# Patient Record
Sex: Female | Born: 1950
Health system: Southern US, Community
[De-identification: ages and names within clinical notes are randomized; demographics above are authoritative.]

## PROBLEM LIST (undated history)

## (undated) DIAGNOSIS — K219 Gastro-esophageal reflux disease without esophagitis: Secondary | ICD-10-CM

## (undated) DIAGNOSIS — R51 Headache: Secondary | ICD-10-CM

## (undated) DIAGNOSIS — D509 Iron deficiency anemia, unspecified: Secondary | ICD-10-CM

## (undated) DIAGNOSIS — J449 Chronic obstructive pulmonary disease, unspecified: Secondary | ICD-10-CM

## (undated) DIAGNOSIS — E785 Hyperlipidemia, unspecified: Secondary | ICD-10-CM

## (undated) DIAGNOSIS — M1990 Unspecified osteoarthritis, unspecified site: Secondary | ICD-10-CM

## (undated) DIAGNOSIS — I82409 Acute embolism and thrombosis of unspecified deep veins of unspecified lower extremity: Secondary | ICD-10-CM

## (undated) DIAGNOSIS — D649 Anemia, unspecified: Secondary | ICD-10-CM

## (undated) DIAGNOSIS — K259 Gastric ulcer, unspecified as acute or chronic, without hemorrhage or perforation: Secondary | ICD-10-CM

## (undated) DIAGNOSIS — I10 Essential (primary) hypertension: Secondary | ICD-10-CM

## (undated) DIAGNOSIS — G8929 Other chronic pain: Secondary | ICD-10-CM

## (undated) DIAGNOSIS — G4733 Obstructive sleep apnea (adult) (pediatric): Secondary | ICD-10-CM

## (undated) DIAGNOSIS — N840 Polyp of corpus uteri: Secondary | ICD-10-CM

## (undated) DIAGNOSIS — R519 Headache, unspecified: Secondary | ICD-10-CM

## (undated) DIAGNOSIS — M1710 Unilateral primary osteoarthritis, unspecified knee: Secondary | ICD-10-CM

## (undated) DIAGNOSIS — Z9289 Personal history of other medical treatment: Secondary | ICD-10-CM

## (undated) DIAGNOSIS — M545 Low back pain, unspecified: Secondary | ICD-10-CM

## (undated) DIAGNOSIS — E119 Type 2 diabetes mellitus without complications: Secondary | ICD-10-CM

## (undated) DIAGNOSIS — I509 Heart failure, unspecified: Secondary | ICD-10-CM

## (undated) DIAGNOSIS — IMO0002 Reserved for concepts with insufficient information to code with codable children: Secondary | ICD-10-CM

## (undated) DIAGNOSIS — I2699 Other pulmonary embolism without acute cor pulmonale: Secondary | ICD-10-CM

## (undated) DIAGNOSIS — K449 Diaphragmatic hernia without obstruction or gangrene: Secondary | ICD-10-CM

## (undated) HISTORY — DX: Gastro-esophageal reflux disease without esophagitis: K21.9

## (undated) HISTORY — DX: Unspecified osteoarthritis, unspecified site: M19.90

## (undated) HISTORY — DX: Other pulmonary embolism without acute cor pulmonale: I26.99

## (undated) HISTORY — DX: Obstructive sleep apnea (adult) (pediatric): G47.33

## (undated) HISTORY — PX: RETINAL DETACHMENT SURGERY: SHX105

## (undated) HISTORY — DX: Hyperlipidemia, unspecified: E78.5

## (undated) HISTORY — PX: EYE SURGERY: SHX253

## (undated) HISTORY — DX: Unilateral primary osteoarthritis, unspecified knee: M17.10

## (undated) HISTORY — DX: Essential (primary) hypertension: I10

## (undated) HISTORY — DX: Morbid (severe) obesity due to excess calories: E66.01

## (undated) HISTORY — DX: Iron deficiency anemia, unspecified: D50.9

## (undated) HISTORY — PX: CATARACT EXTRACTION W/ INTRAOCULAR LENS  IMPLANT, BILATERAL: SHX1307

## (undated) HISTORY — PX: TUBAL LIGATION: SHX77

## (undated) HISTORY — DX: Reserved for concepts with insufficient information to code with codable children: IMO0002

---

## 2000-09-25 ENCOUNTER — Ambulatory Visit (HOSPITAL_COMMUNITY): Admission: RE | Admit: 2000-09-25 | Discharge: 2000-09-25 | Payer: Self-pay | Admitting: Cardiovascular Disease

## 2001-10-17 ENCOUNTER — Ambulatory Visit (HOSPITAL_COMMUNITY): Admission: RE | Admit: 2001-10-17 | Discharge: 2001-10-17 | Payer: Self-pay | Admitting: Family Medicine

## 2001-10-17 ENCOUNTER — Encounter: Payer: Self-pay | Admitting: Family Medicine

## 2003-07-20 ENCOUNTER — Encounter: Payer: Self-pay | Admitting: Occupational Therapy

## 2003-07-20 ENCOUNTER — Ambulatory Visit (HOSPITAL_COMMUNITY): Admission: RE | Admit: 2003-07-20 | Discharge: 2003-07-20 | Payer: Self-pay | Admitting: Family Medicine

## 2004-09-21 ENCOUNTER — Ambulatory Visit: Payer: Self-pay | Admitting: Family Medicine

## 2005-05-03 ENCOUNTER — Ambulatory Visit: Payer: Self-pay | Admitting: Family Medicine

## 2005-05-04 ENCOUNTER — Ambulatory Visit: Payer: Self-pay | Admitting: *Deleted

## 2005-05-10 ENCOUNTER — Ambulatory Visit (HOSPITAL_COMMUNITY): Admission: RE | Admit: 2005-05-10 | Discharge: 2005-05-10 | Payer: Self-pay | Admitting: Family Medicine

## 2005-05-18 ENCOUNTER — Ambulatory Visit: Payer: Self-pay | Admitting: Family Medicine

## 2005-06-25 ENCOUNTER — Ambulatory Visit (HOSPITAL_COMMUNITY): Admission: RE | Admit: 2005-06-25 | Discharge: 2005-06-25 | Payer: Self-pay | Admitting: Family Medicine

## 2005-09-12 ENCOUNTER — Ambulatory Visit: Payer: Self-pay | Admitting: Family Medicine

## 2005-10-08 ENCOUNTER — Ambulatory Visit: Payer: Self-pay | Admitting: Family Medicine

## 2006-02-07 ENCOUNTER — Ambulatory Visit: Payer: Self-pay | Admitting: Family Medicine

## 2006-04-16 ENCOUNTER — Ambulatory Visit: Payer: Self-pay | Admitting: Family Medicine

## 2006-05-08 ENCOUNTER — Ambulatory Visit: Payer: Self-pay | Admitting: Gastroenterology

## 2006-05-16 ENCOUNTER — Ambulatory Visit (HOSPITAL_COMMUNITY): Admission: RE | Admit: 2006-05-16 | Discharge: 2006-05-16 | Payer: Self-pay | Admitting: Gastroenterology

## 2006-05-21 ENCOUNTER — Ambulatory Visit: Payer: Self-pay | Admitting: Gastroenterology

## 2006-06-24 ENCOUNTER — Ambulatory Visit: Payer: Self-pay | Admitting: Family Medicine

## 2006-09-16 ENCOUNTER — Ambulatory Visit: Payer: Self-pay | Admitting: Family Medicine

## 2006-09-19 ENCOUNTER — Ambulatory Visit (HOSPITAL_COMMUNITY): Admission: RE | Admit: 2006-09-19 | Discharge: 2006-09-19 | Payer: Self-pay | Admitting: Family Medicine

## 2006-10-07 ENCOUNTER — Ambulatory Visit: Payer: Self-pay | Admitting: Family Medicine

## 2006-10-10 DIAGNOSIS — G43909 Migraine, unspecified, not intractable, without status migrainosus: Secondary | ICD-10-CM

## 2006-10-15 ENCOUNTER — Encounter: Admission: RE | Admit: 2006-10-15 | Discharge: 2006-10-15 | Payer: Self-pay | Admitting: Neurology

## 2006-11-11 ENCOUNTER — Ambulatory Visit: Payer: Self-pay | Admitting: Family Medicine

## 2006-11-14 ENCOUNTER — Ambulatory Visit (HOSPITAL_BASED_OUTPATIENT_CLINIC_OR_DEPARTMENT_OTHER): Admission: RE | Admit: 2006-11-14 | Discharge: 2006-11-14 | Payer: Self-pay | Admitting: Family Medicine

## 2006-11-17 ENCOUNTER — Ambulatory Visit: Payer: Self-pay | Admitting: Internal Medicine

## 2007-01-08 ENCOUNTER — Ambulatory Visit (HOSPITAL_BASED_OUTPATIENT_CLINIC_OR_DEPARTMENT_OTHER): Admission: RE | Admit: 2007-01-08 | Discharge: 2007-01-08 | Payer: Self-pay | Admitting: Family Medicine

## 2007-01-12 ENCOUNTER — Ambulatory Visit: Payer: Self-pay | Admitting: Internal Medicine

## 2007-04-21 ENCOUNTER — Emergency Department (HOSPITAL_COMMUNITY): Admission: EM | Admit: 2007-04-21 | Discharge: 2007-04-21 | Payer: Self-pay | Admitting: *Deleted

## 2007-06-10 ENCOUNTER — Ambulatory Visit: Payer: Self-pay | Admitting: Family Medicine

## 2007-06-25 ENCOUNTER — Ambulatory Visit: Payer: Self-pay | Admitting: Family Medicine

## 2007-07-07 ENCOUNTER — Encounter: Admission: RE | Admit: 2007-07-07 | Discharge: 2007-07-07 | Payer: Self-pay | Admitting: Sports Medicine

## 2007-07-16 ENCOUNTER — Encounter: Admission: RE | Admit: 2007-07-16 | Discharge: 2007-07-16 | Payer: Self-pay | Admitting: Orthopedic Surgery

## 2007-08-04 DIAGNOSIS — J029 Acute pharyngitis, unspecified: Secondary | ICD-10-CM

## 2007-08-04 DIAGNOSIS — I10 Essential (primary) hypertension: Secondary | ICD-10-CM | POA: Insufficient documentation

## 2007-08-04 DIAGNOSIS — R51 Headache: Secondary | ICD-10-CM

## 2007-08-04 DIAGNOSIS — K219 Gastro-esophageal reflux disease without esophagitis: Secondary | ICD-10-CM | POA: Insufficient documentation

## 2007-08-04 DIAGNOSIS — E119 Type 2 diabetes mellitus without complications: Secondary | ICD-10-CM

## 2007-08-04 DIAGNOSIS — K5730 Diverticulosis of large intestine without perforation or abscess without bleeding: Secondary | ICD-10-CM | POA: Insufficient documentation

## 2007-08-04 DIAGNOSIS — M1990 Unspecified osteoarthritis, unspecified site: Secondary | ICD-10-CM

## 2007-08-04 DIAGNOSIS — G4730 Sleep apnea, unspecified: Secondary | ICD-10-CM | POA: Insufficient documentation

## 2007-08-04 DIAGNOSIS — R519 Headache, unspecified: Secondary | ICD-10-CM | POA: Insufficient documentation

## 2008-01-09 ENCOUNTER — Encounter (INDEPENDENT_AMBULATORY_CARE_PROVIDER_SITE_OTHER): Payer: Self-pay | Admitting: Family Medicine

## 2008-01-21 ENCOUNTER — Ambulatory Visit: Payer: Self-pay | Admitting: Nurse Practitioner

## 2008-01-21 DIAGNOSIS — G4700 Insomnia, unspecified: Secondary | ICD-10-CM

## 2008-01-29 ENCOUNTER — Encounter (INDEPENDENT_AMBULATORY_CARE_PROVIDER_SITE_OTHER): Payer: Self-pay | Admitting: Family Medicine

## 2008-01-29 ENCOUNTER — Ambulatory Visit: Payer: Self-pay | Admitting: Nurse Practitioner

## 2008-01-29 LAB — CONVERTED CEMR LAB
Albumin: 3.7 g/dL (ref 3.5–5.2)
BUN: 10 mg/dL (ref 6–23)
CO2: 25 meq/L (ref 19–32)
Calcium: 9.3 mg/dL (ref 8.4–10.5)
Chloride: 106 meq/L (ref 96–112)
Glucose, Bld: 107 mg/dL — ABNORMAL HIGH (ref 70–99)
HDL: 49 mg/dL (ref >39)
Lymphocytes Relative: 24 % (ref 12–46)
Lymphs Abs: 1.5 10*3/uL (ref 0.7–4.0)
MCV: 90.3 fL (ref 78.0–100.0)
Monocytes Relative: 5 % (ref 3–12)
Neutro Abs: 4.1 10*3/uL (ref 1.7–7.7)
Neutrophils Relative %: 69 % (ref 43–77)
Potassium: 4.5 meq/L (ref 3.5–5.3)
RBC: 4.65 M/uL (ref 3.87–5.11)
Triglycerides: 111 mg/dL (ref <150)
WBC: 6 10*3/uL (ref 4.0–10.5)

## 2008-02-05 ENCOUNTER — Telehealth (INDEPENDENT_AMBULATORY_CARE_PROVIDER_SITE_OTHER): Payer: Self-pay | Admitting: Family Medicine

## 2008-02-12 ENCOUNTER — Encounter (INDEPENDENT_AMBULATORY_CARE_PROVIDER_SITE_OTHER): Payer: Self-pay | Admitting: Family Medicine

## 2008-03-11 ENCOUNTER — Telehealth (INDEPENDENT_AMBULATORY_CARE_PROVIDER_SITE_OTHER): Payer: Self-pay | Admitting: Family Medicine

## 2008-03-12 ENCOUNTER — Encounter (INDEPENDENT_AMBULATORY_CARE_PROVIDER_SITE_OTHER): Payer: Self-pay | Admitting: Family Medicine

## 2008-05-05 ENCOUNTER — Ambulatory Visit: Payer: Self-pay | Admitting: Family Medicine

## 2008-05-05 DIAGNOSIS — R5383 Other fatigue: Secondary | ICD-10-CM

## 2008-05-05 DIAGNOSIS — R5381 Other malaise: Secondary | ICD-10-CM

## 2008-05-05 LAB — CONVERTED CEMR LAB
Blood Glucose, Fingerstick: 96
HCT: 44 %

## 2008-05-09 ENCOUNTER — Telehealth (INDEPENDENT_AMBULATORY_CARE_PROVIDER_SITE_OTHER): Payer: Self-pay | Admitting: Family Medicine

## 2008-05-12 ENCOUNTER — Telehealth (INDEPENDENT_AMBULATORY_CARE_PROVIDER_SITE_OTHER): Payer: Self-pay | Admitting: Family Medicine

## 2008-06-02 ENCOUNTER — Encounter (INDEPENDENT_AMBULATORY_CARE_PROVIDER_SITE_OTHER): Payer: Self-pay | Admitting: Family Medicine

## 2008-06-02 ENCOUNTER — Telehealth (INDEPENDENT_AMBULATORY_CARE_PROVIDER_SITE_OTHER): Payer: Self-pay | Admitting: Family Medicine

## 2008-06-30 ENCOUNTER — Encounter (INDEPENDENT_AMBULATORY_CARE_PROVIDER_SITE_OTHER): Payer: Self-pay | Admitting: Family Medicine

## 2008-07-05 ENCOUNTER — Ambulatory Visit: Payer: Self-pay | Admitting: Family Medicine

## 2008-07-05 DIAGNOSIS — E785 Hyperlipidemia, unspecified: Secondary | ICD-10-CM

## 2008-07-05 LAB — CONVERTED CEMR LAB
ALT: 20 units/L (ref 0–35)
AST: 17 units/L (ref 0–37)
Albumin: 3.9 g/dL (ref 3.5–5.2)
Alkaline Phosphatase: 96 units/L (ref 39–117)
BUN: 21 mg/dL (ref 6–23)
Basophils Relative: 0 % (ref 0–1)
Blood Glucose, Fingerstick: 110
Calcium: 9 mg/dL (ref 8.4–10.5)
Chloride: 108 meq/L (ref 96–112)
HDL: 55 mg/dL (ref >39)
LDL Cholesterol: 109 mg/dL — ABNORMAL HIGH (ref 0–99)
Lymphs Abs: 1.4 10*3/uL (ref 0.7–4.0)
MCHC: 32.1 g/dL (ref 30.0–36.0)
Microalb, Ur: 1.03 mg/dL (ref 0.00–1.89)
Monocytes Relative: 6 % (ref 3–12)
Neutro Abs: 3.2 10*3/uL (ref 1.7–7.7)
Neutrophils Relative %: 64 % (ref 43–77)
Platelets: 225 10*3/uL (ref 150–400)
Potassium: 4.2 meq/L (ref 3.5–5.3)
RBC: 4.69 M/uL (ref 3.87–5.11)
Sodium: 142 meq/L (ref 135–145)
TSH: 1.462 microintl units/mL (ref 0.350–4.50)
Total Protein: 7 g/dL (ref 6.0–8.3)
WBC: 5 10*3/uL (ref 4.0–10.5)

## 2008-07-08 ENCOUNTER — Telehealth (INDEPENDENT_AMBULATORY_CARE_PROVIDER_SITE_OTHER): Payer: Self-pay | Admitting: *Deleted

## 2008-07-13 ENCOUNTER — Ambulatory Visit (HOSPITAL_COMMUNITY): Admission: RE | Admit: 2008-07-13 | Discharge: 2008-07-13 | Payer: Self-pay | Admitting: Family Medicine

## 2008-07-13 ENCOUNTER — Encounter (INDEPENDENT_AMBULATORY_CARE_PROVIDER_SITE_OTHER): Payer: Self-pay | Admitting: Family Medicine

## 2008-07-15 ENCOUNTER — Encounter (INDEPENDENT_AMBULATORY_CARE_PROVIDER_SITE_OTHER): Payer: Self-pay | Admitting: Internal Medicine

## 2008-08-02 ENCOUNTER — Encounter (INDEPENDENT_AMBULATORY_CARE_PROVIDER_SITE_OTHER): Payer: Self-pay | Admitting: Family Medicine

## 2008-08-06 ENCOUNTER — Ambulatory Visit: Payer: Self-pay | Admitting: Internal Medicine

## 2008-08-06 DIAGNOSIS — J069 Acute upper respiratory infection, unspecified: Secondary | ICD-10-CM | POA: Insufficient documentation

## 2008-08-06 LAB — CONVERTED CEMR LAB: Blood Glucose, Fingerstick: 113

## 2008-08-19 ENCOUNTER — Telehealth (INDEPENDENT_AMBULATORY_CARE_PROVIDER_SITE_OTHER): Payer: Self-pay | Admitting: Family Medicine

## 2008-08-20 ENCOUNTER — Ambulatory Visit: Payer: Self-pay | Admitting: Internal Medicine

## 2008-08-20 DIAGNOSIS — J019 Acute sinusitis, unspecified: Secondary | ICD-10-CM

## 2008-09-24 ENCOUNTER — Telehealth (INDEPENDENT_AMBULATORY_CARE_PROVIDER_SITE_OTHER): Payer: Self-pay | Admitting: *Deleted

## 2008-11-19 ENCOUNTER — Encounter (INDEPENDENT_AMBULATORY_CARE_PROVIDER_SITE_OTHER): Payer: Self-pay | Admitting: Family Medicine

## 2009-02-14 ENCOUNTER — Encounter (INDEPENDENT_AMBULATORY_CARE_PROVIDER_SITE_OTHER): Payer: Self-pay | Admitting: Family Medicine

## 2009-02-21 IMAGING — CT CT L SPINE W/ CM
3 of 6 series · 13 of 27 positions shown, 14 images · IV contrast (omnipaque)
Comparison: none

CLINICAL DATA: Back pain and right leg pain. 
LUMBAR MYELOGRAM:
TECHNIQUE: A lumbar puncture was performed at the L2-3 interlaminar space from a right sided approach to the midline using a 5 inch 22 gauge spinal needle.  18 cc of Omnipaque 180 were instilled.
TECHNIQUE: Multidetector CT imaging of the lumbar spine was performed after intrathecal injection of contrast.  Multiplanar CT image reconstructions were also generated.

[Series 2: l spine/prone · axial · 0.27mm/px · z∈[-272,-205]mm · 4 of 46 slices shown, 5 images]
[im 10/46  soft-tissue]
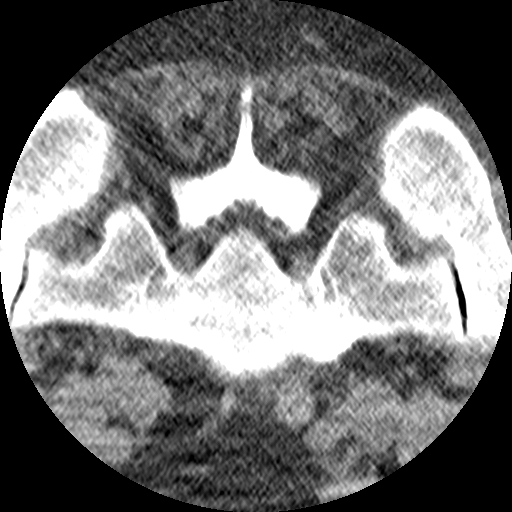
[im 10/46  bone]
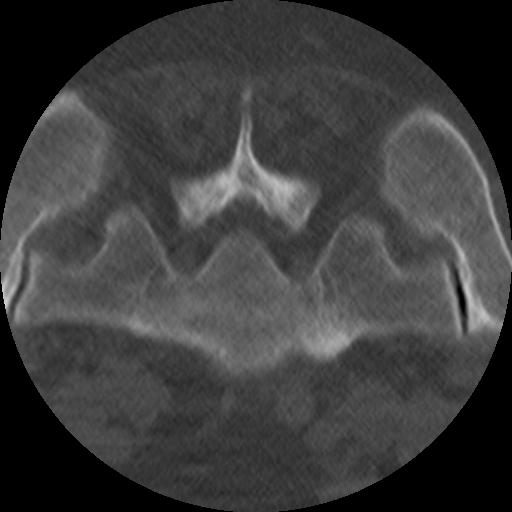
[im 19/46  bone]
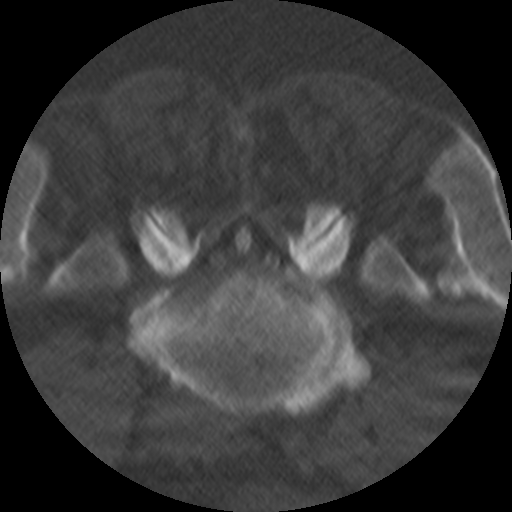
[im 28/46  bone]
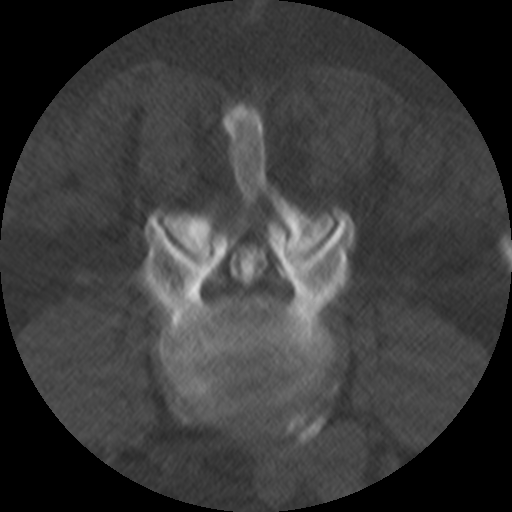
[im 37/46  bone]
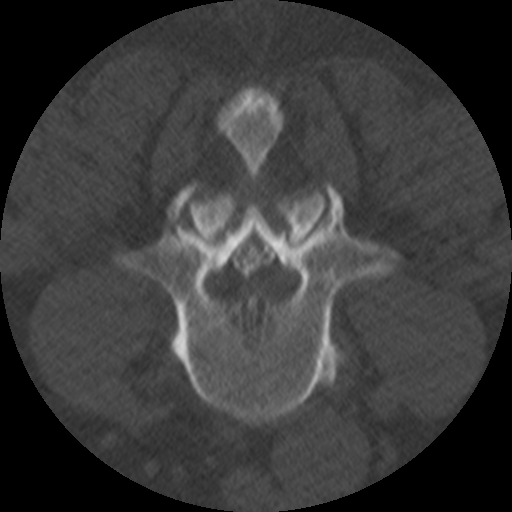

[Series 3: prone/lspine · axial · 0.27mm/px · z∈[-272,-205]mm · 4 of 46 slices shown]
[im 10/46  bone]
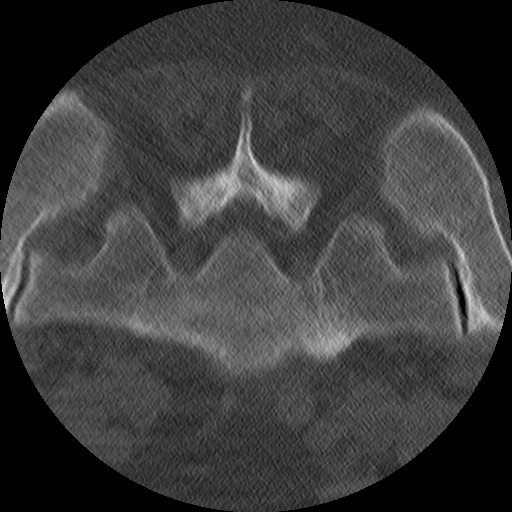
[im 19/46  bone]
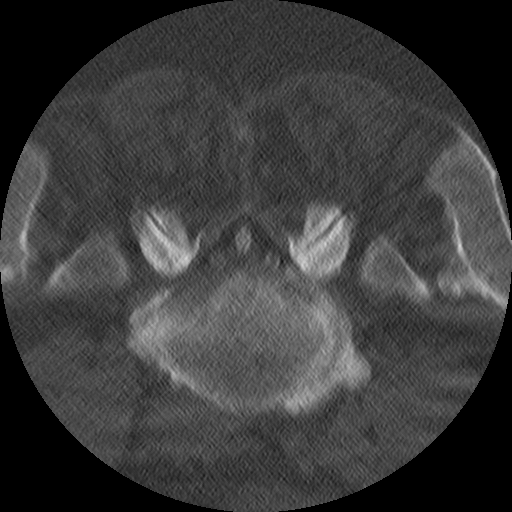
[im 28/46  bone]
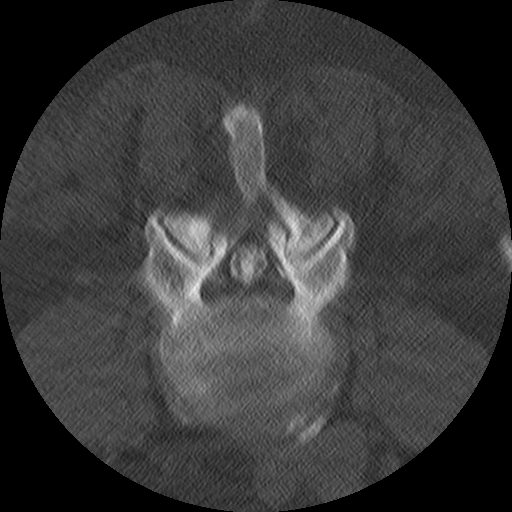
[im 37/46  bone]
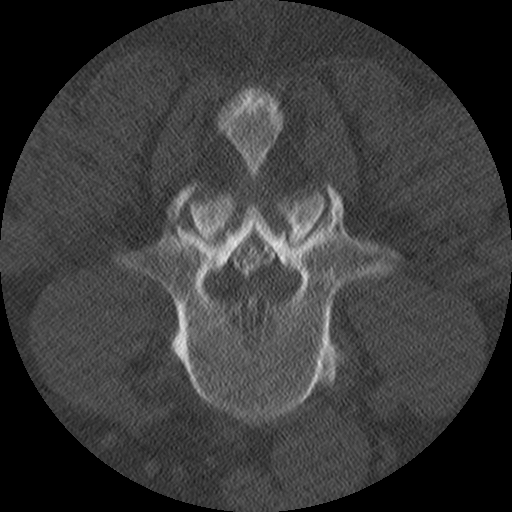

[Series 400: cor/prone · coronal · 0.27mm/px · 5 of 40 slices shown]
[im 7/40  bone]
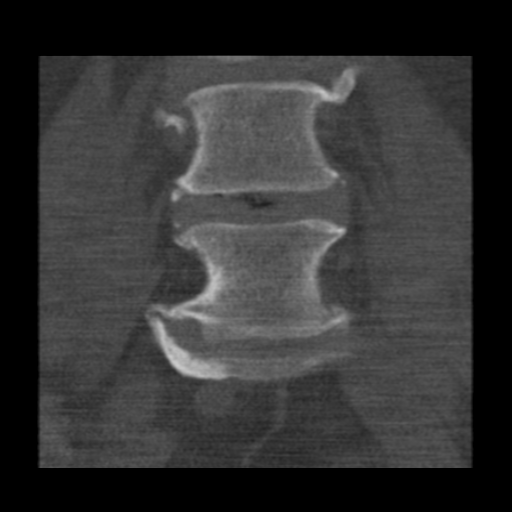
[im 14/40  bone]
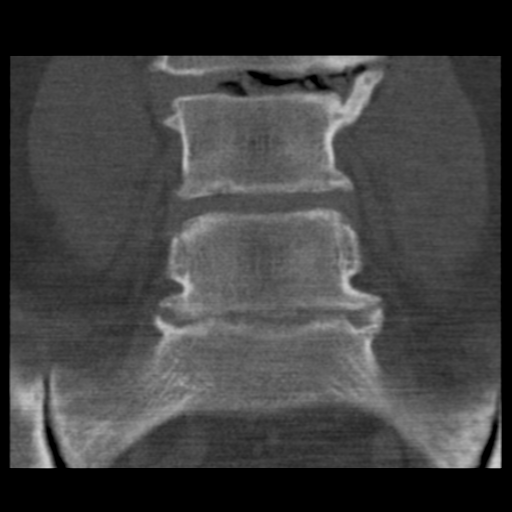
[im 20/40  bone]
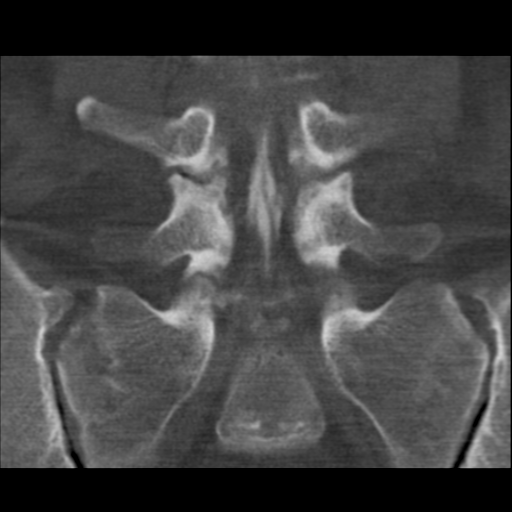
[im 27/40  bone]
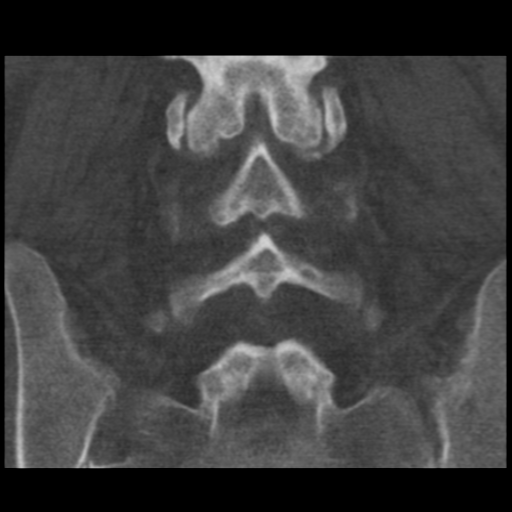
[im 33/40  bone]
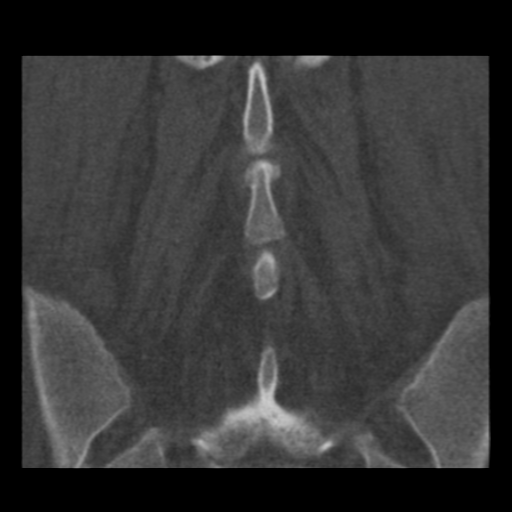

[13 of 27 positions shown; findings below may reference images not displayed]

FINDINGS: The L1-2 level is unremarkable.  There is a small anterior extradural defect at L2-3, but no significant stenosis.
L3-4:  There is moderate multifactorial stenosis.  The disk is degenerated and there is crowding of the nerve roots with effacement of the subarachnoid space at this level.  This appears more pronounced on the right than the left. 
L4-5:   There is a small anterior extradural defect, but no gross stenosis.
L5-S1:   There is a small anterior extradural defect, but no gross stenosis.
Standing lateral flexion and extension views do not show any particular change.
IMPRESSION: 1.    multifactorial stenosis at L3-4.
2.   Small anterior extradural defect at L2-3 without gross compressive stenosis.
POST MYELOGRAM CT SCAN OF THE LUMBAR SPINE:
FINDINGS: T12-L1:  No abnormality.
L1-2:  No abnormality.
L2-3:  The disk is degenerated and shows a moderate circumferential protrusion. There is facet arthropathy bilaterally.  No definite compressive stenosis, however. 
L3-4:  The disk is markedly degenerated with vacuum phenomenon.   There is circumferential protrusion of disk material.  The facets and ligaments are hypertrophic.  There is moderate stenosis at this level, more severe on the right than the left. 
L4-5:  There is a disk bulge, but I do not believe there is any compressive pathology.  I think there is some prominent ventral epidural veins and some epidural fat.  
L5-S1:  Disk bulge and facet arthropathy.  No compressive pathology suspected. 
We brought the patient back and did some prone scanning to confirm the findings at L4-5 and L5-S1.
IMPRESSION: 1.  Multifactorial stenosis at L3-4 because of endplate osteophytes, protruding disk material, and facet and ligamentous hypertrophy.  The stenosis is more pronounced on the right than the left.
2.  L4-5:  Disk bulge and facet arthropathy, but no compressive pathology suspected. 
3.  L5-S1:  Diminutive thecal sac.  No compressive pathology suspected.  Disk bulge and facet arthropathy.

## 2009-02-21 IMAGING — CR DG MYELOGRAM LUMBAR
3 series · 3 of 3 positions shown · IV contrast (omnipaque)
Comparison: none

CLINICAL DATA: Back pain and right leg pain. 
LUMBAR MYELOGRAM:
TECHNIQUE: A lumbar puncture was performed at the L2-3 interlaminar space from a right sided approach to the midline using a 5 inch 22 gauge spinal needle.  18 cc of Omnipaque 180 were instilled.
TECHNIQUE: Multidetector CT imaging of the lumbar spine was performed after intrathecal injection of contrast.  Multiplanar CT image reconstructions were also generated.

[view not recorded (1 of 3)]
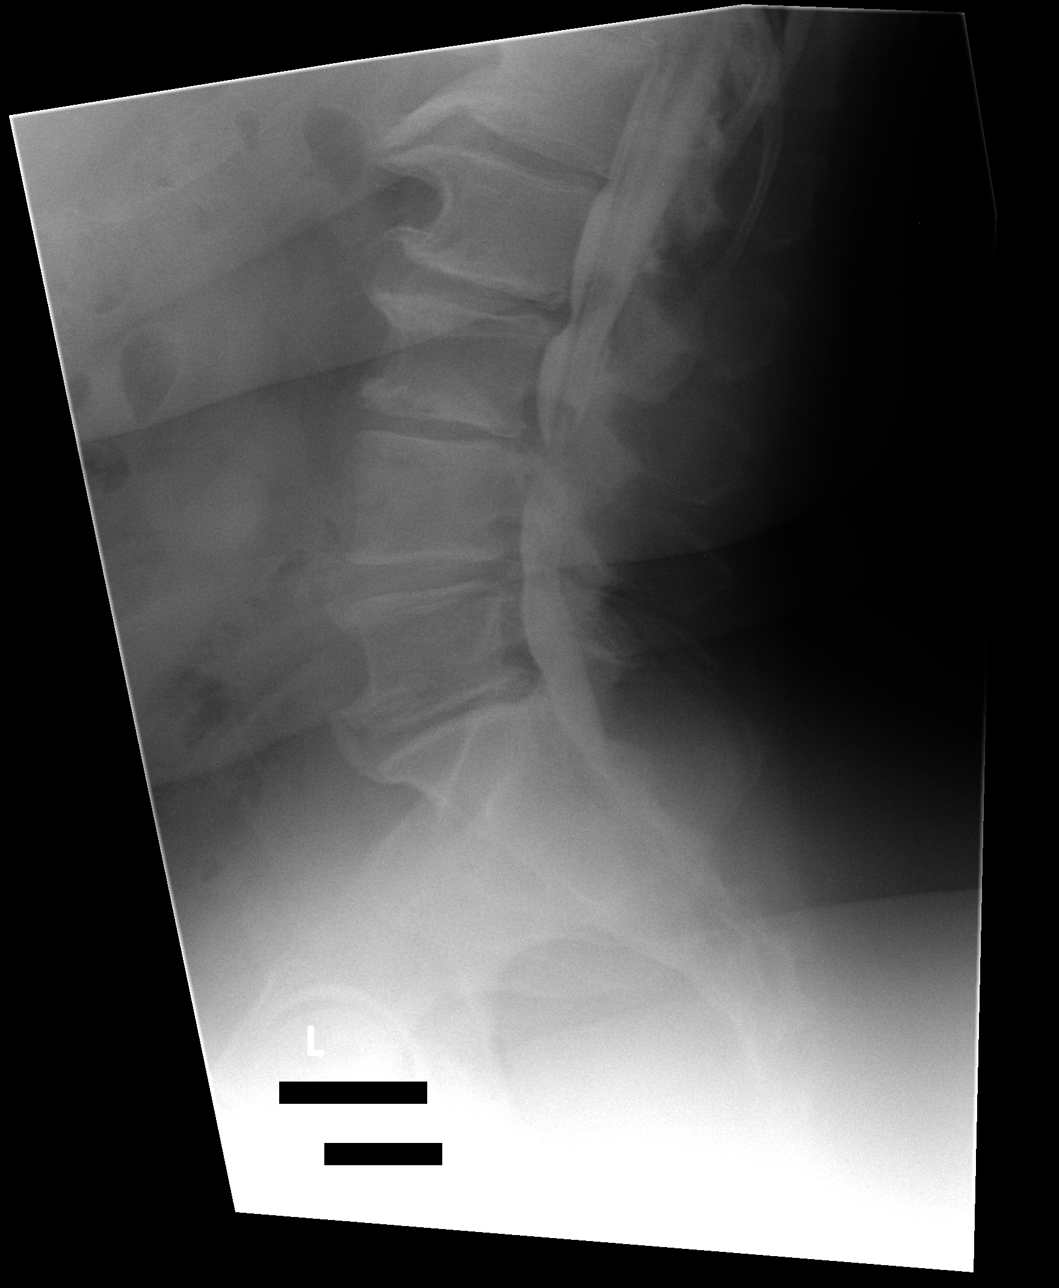

[view not recorded (2 of 3)]
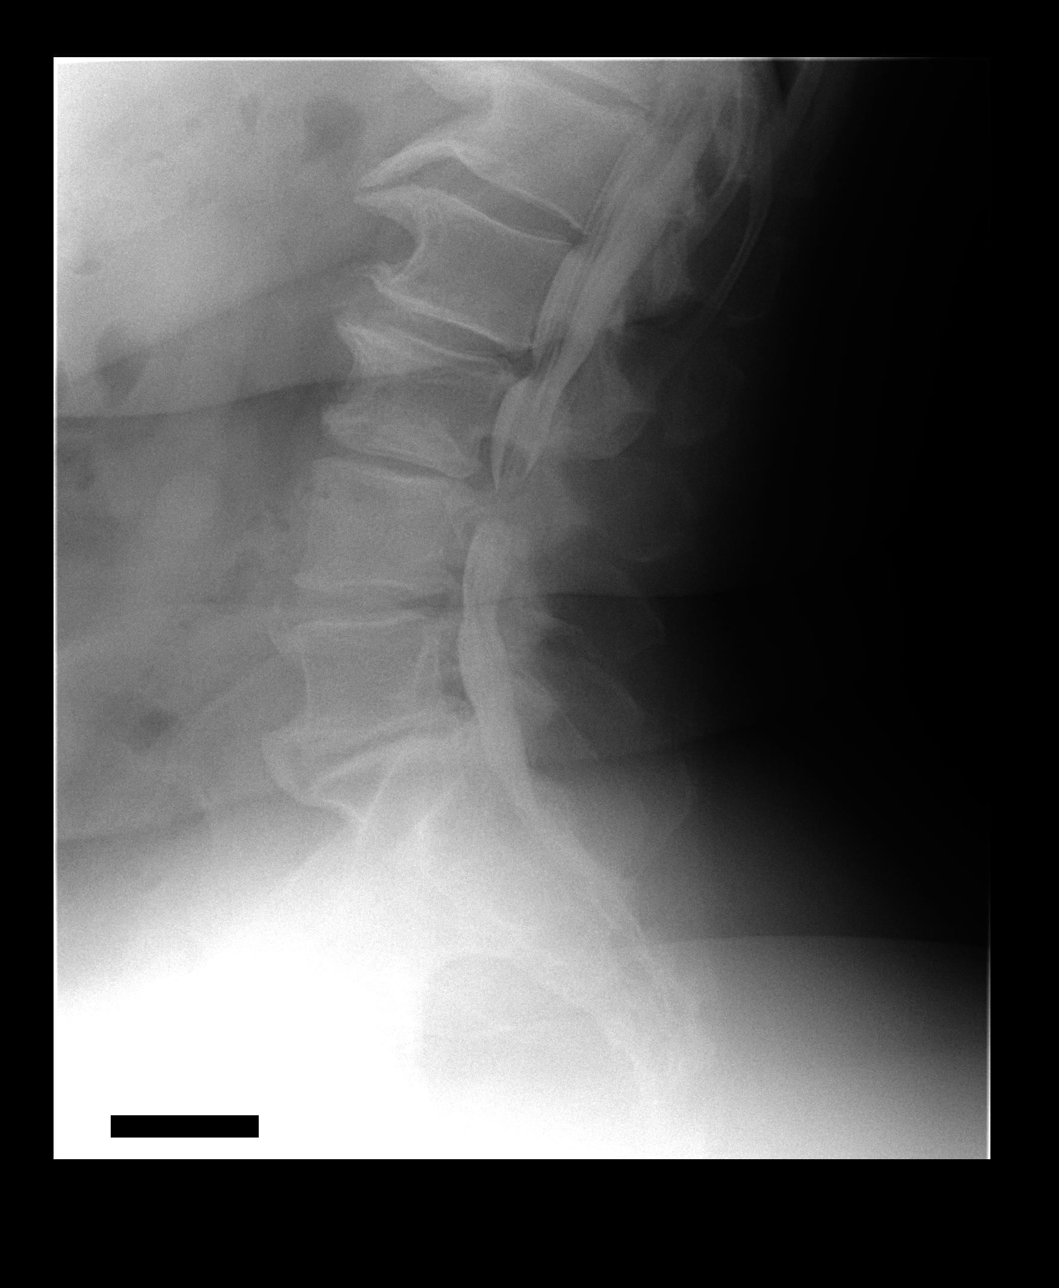

[view not recorded (3 of 3)]
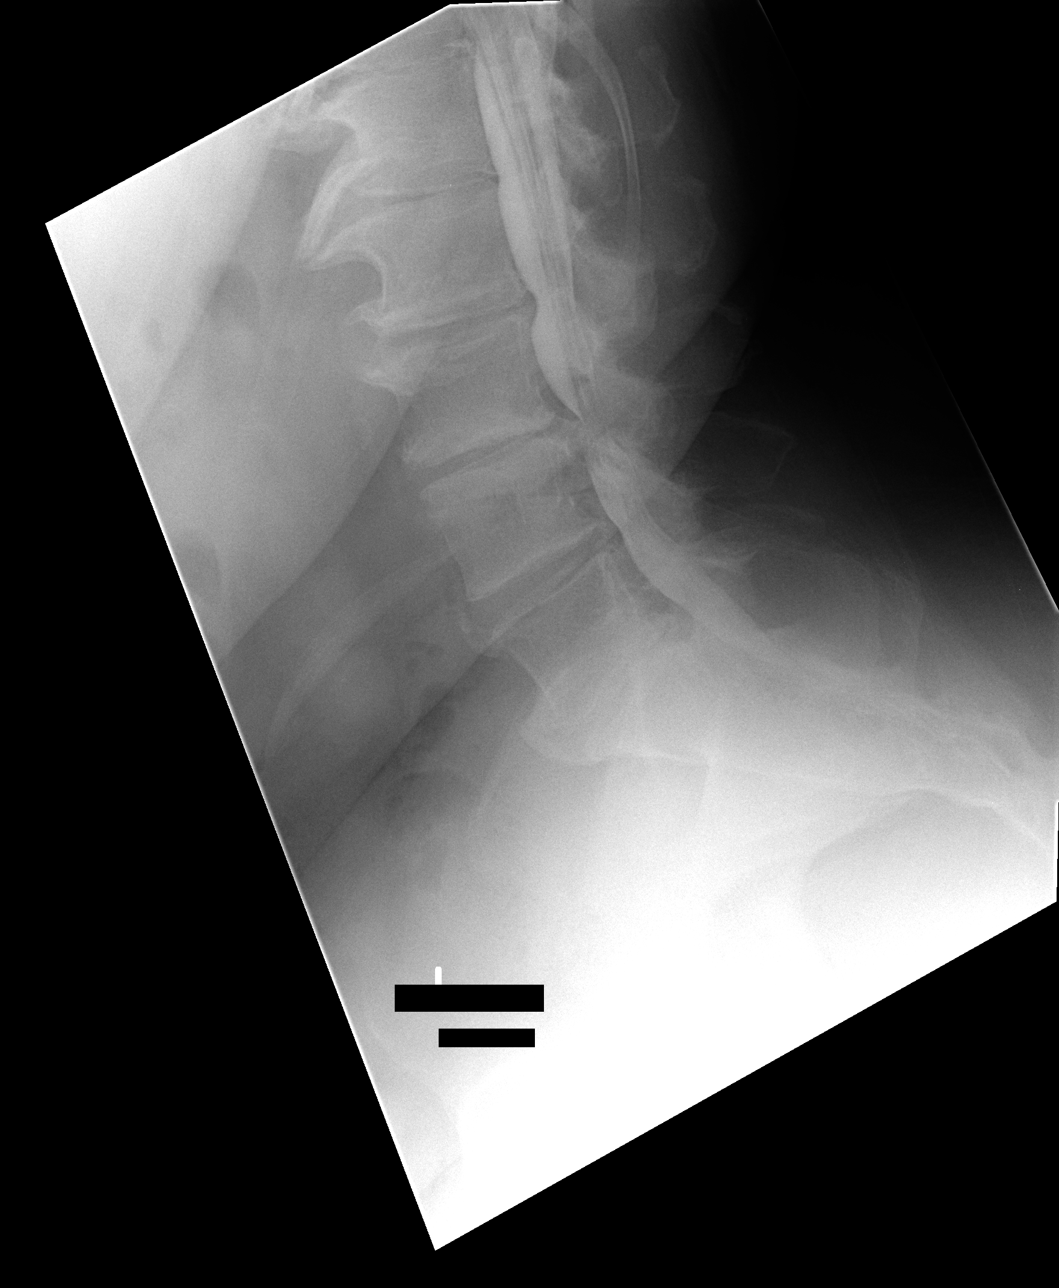

[3 of 3 positions shown; findings below may reference images not displayed]

FINDINGS: The L1-2 level is unremarkable.  There is a small anterior extradural defect at L2-3, but no significant stenosis.
L3-4:  There is moderate multifactorial stenosis.  The disk is degenerated and there is crowding of the nerve roots with effacement of the subarachnoid space at this level.  This appears more pronounced on the right than the left. 
L4-5:   There is a small anterior extradural defect, but no gross stenosis.
L5-S1:   There is a small anterior extradural defect, but no gross stenosis.
Standing lateral flexion and extension views do not show any particular change.
IMPRESSION: 1.    multifactorial stenosis at L3-4.
2.   Small anterior extradural defect at L2-3 without gross compressive stenosis.
POST MYELOGRAM CT SCAN OF THE LUMBAR SPINE:
FINDINGS: T12-L1:  No abnormality.
L1-2:  No abnormality.
L2-3:  The disk is degenerated and shows a moderate circumferential protrusion. There is facet arthropathy bilaterally.  No definite compressive stenosis, however. 
L3-4:  The disk is markedly degenerated with vacuum phenomenon.   There is circumferential protrusion of disk material.  The facets and ligaments are hypertrophic.  There is moderate stenosis at this level, more severe on the right than the left. 
L4-5:  There is a disk bulge, but I do not believe there is any compressive pathology.  I think there is some prominent ventral epidural veins and some epidural fat.  
L5-S1:  Disk bulge and facet arthropathy.  No compressive pathology suspected. 
We brought the patient back and did some prone scanning to confirm the findings at L4-5 and L5-S1.
IMPRESSION: 1.  Multifactorial stenosis at L3-4 because of endplate osteophytes, protruding disk material, and facet and ligamentous hypertrophy.  The stenosis is more pronounced on the right than the left.
2.  L4-5:  Disk bulge and facet arthropathy, but no compressive pathology suspected. 
3.  L5-S1:  Diminutive thecal sac.  No compressive pathology suspected.  Disk bulge and facet arthropathy.

## 2009-03-02 ENCOUNTER — Telehealth (INDEPENDENT_AMBULATORY_CARE_PROVIDER_SITE_OTHER): Payer: Self-pay | Admitting: Family Medicine

## 2009-04-06 ENCOUNTER — Ambulatory Visit: Payer: Self-pay | Admitting: Family Medicine

## 2009-04-06 DIAGNOSIS — R0989 Other specified symptoms and signs involving the circulatory and respiratory systems: Secondary | ICD-10-CM

## 2009-04-06 LAB — CONVERTED CEMR LAB
AST: 13 units/L (ref 0–37)
Albumin: 3.9 g/dL (ref 3.5–5.2)
Alkaline Phosphatase: 86 units/L (ref 39–117)
Basophils Absolute: 0 10*3/uL (ref 0.0–0.1)
Basophils Relative: 0 % (ref 0–1)
Blood Glucose, AC Bkfst: 84 mg/dL
Glucose, Bld: 93 mg/dL (ref 70–99)
HDL: 57 mg/dL (ref >39)
Hgb A1c MFr Bld: 6.5 %
LDL Cholesterol: 130 mg/dL — ABNORMAL HIGH (ref 0–99)
Lymphocytes Relative: 35 % (ref 12–46)
MCHC: 32.4 g/dL (ref 30.0–36.0)
Monocytes Absolute: 0.3 10*3/uL (ref 0.1–1.0)
Neutro Abs: 2.8 10*3/uL (ref 1.7–7.7)
Neutrophils Relative %: 58 % (ref 43–77)
Platelets: 274 10*3/uL (ref 150–400)
Potassium: 4.3 meq/L (ref 3.5–5.3)
RDW: 15.1 % (ref 11.5–15.5)
Sodium: 142 meq/L (ref 135–145)
TSH: 1.804 microintl units/mL (ref 0.350–4.500)
Total Bilirubin: 0.3 mg/dL (ref 0.3–1.2)
Total Protein: 7 g/dL (ref 6.0–8.3)
Triglycerides: 93 mg/dL (ref <150)
VLDL: 19 mg/dL (ref 0–40)

## 2009-04-14 ENCOUNTER — Ambulatory Visit (HOSPITAL_COMMUNITY): Admission: RE | Admit: 2009-04-14 | Discharge: 2009-04-14 | Payer: Self-pay | Admitting: Family Medicine

## 2009-04-14 ENCOUNTER — Ambulatory Visit: Payer: Self-pay | Admitting: Surgery

## 2009-04-15 ENCOUNTER — Encounter (INDEPENDENT_AMBULATORY_CARE_PROVIDER_SITE_OTHER): Payer: Self-pay | Admitting: Family Medicine

## 2009-04-20 ENCOUNTER — Encounter (INDEPENDENT_AMBULATORY_CARE_PROVIDER_SITE_OTHER): Payer: Self-pay | Admitting: Family Medicine

## 2009-04-23 ENCOUNTER — Telehealth (INDEPENDENT_AMBULATORY_CARE_PROVIDER_SITE_OTHER): Payer: Self-pay | Admitting: Family Medicine

## 2009-04-25 ENCOUNTER — Encounter (INDEPENDENT_AMBULATORY_CARE_PROVIDER_SITE_OTHER): Payer: Self-pay | Admitting: Family Medicine

## 2009-05-10 ENCOUNTER — Encounter (INDEPENDENT_AMBULATORY_CARE_PROVIDER_SITE_OTHER): Payer: Self-pay | Admitting: Family Medicine

## 2009-05-19 ENCOUNTER — Encounter (INDEPENDENT_AMBULATORY_CARE_PROVIDER_SITE_OTHER): Payer: Self-pay | Admitting: Family Medicine

## 2009-07-18 ENCOUNTER — Encounter (INDEPENDENT_AMBULATORY_CARE_PROVIDER_SITE_OTHER): Payer: Self-pay | Admitting: Internal Medicine

## 2009-09-13 ENCOUNTER — Encounter (INDEPENDENT_AMBULATORY_CARE_PROVIDER_SITE_OTHER): Payer: Self-pay | Admitting: Nurse Practitioner

## 2009-11-08 ENCOUNTER — Ambulatory Visit: Payer: Self-pay | Admitting: Physician Assistant

## 2009-11-08 ENCOUNTER — Telehealth: Payer: Self-pay | Admitting: Physician Assistant

## 2009-11-08 DIAGNOSIS — R079 Chest pain, unspecified: Secondary | ICD-10-CM

## 2009-11-08 DIAGNOSIS — R609 Edema, unspecified: Secondary | ICD-10-CM | POA: Insufficient documentation

## 2009-11-08 LAB — CONVERTED CEMR LAB: Blood Glucose, Fingerstick: 102

## 2009-11-10 LAB — CONVERTED CEMR LAB
ALT: 9 units/L (ref 0–35)
AST: 9 units/L (ref 0–37)
Alkaline Phosphatase: 90 units/L (ref 39–117)
BUN: 21 mg/dL (ref 6–23)
Basophils Absolute: 0 10*3/uL (ref 0.0–0.1)
Basophils Relative: 0 % (ref 0–1)
Calcium: 9.3 mg/dL (ref 8.4–10.5)
Creatinine, Ser: 0.76 mg/dL (ref 0.40–1.20)
Eosinophils Absolute: 0.1 10*3/uL (ref 0.0–0.7)
Eosinophils Relative: 1 % (ref 0–5)
HCT: 38.5 % (ref 36.0–46.0)
MCV: 89.7 fL (ref 78.0–100.0)
Neutrophils Relative %: 70 % (ref 43–77)
Platelets: 274 10*3/uL (ref 150–400)
RDW: 14.7 % (ref 11.5–15.5)
Total Bilirubin: 0.3 mg/dL (ref 0.3–1.2)

## 2009-11-15 ENCOUNTER — Encounter: Payer: Self-pay | Admitting: Physician Assistant

## 2009-11-15 ENCOUNTER — Encounter (INDEPENDENT_AMBULATORY_CARE_PROVIDER_SITE_OTHER): Payer: Self-pay | Admitting: Internal Medicine

## 2009-11-15 ENCOUNTER — Ambulatory Visit (HOSPITAL_COMMUNITY): Admission: RE | Admit: 2009-11-15 | Discharge: 2009-11-15 | Payer: Self-pay | Admitting: Internal Medicine

## 2009-11-17 ENCOUNTER — Ambulatory Visit: Payer: Self-pay | Admitting: Physician Assistant

## 2009-11-17 LAB — CONVERTED CEMR LAB
Blood Glucose, Fingerstick: 138
Cholesterol, target level: 200 mg/dL
HDL goal, serum: 40 mg/dL
Hgb A1c MFr Bld: 6.5 %
LDL Goal: 100 mg/dL

## 2009-11-28 LAB — CONVERTED CEMR LAB
CO2: 21 meq/L (ref 19–32)
Calcium: 9.2 mg/dL (ref 8.4–10.5)
Creatinine, Ser: 0.85 mg/dL (ref 0.40–1.20)

## 2009-12-29 ENCOUNTER — Ambulatory Visit: Payer: Self-pay | Admitting: Physician Assistant

## 2009-12-29 LAB — CONVERTED CEMR LAB
Chloride: 106 meq/L (ref 96–112)
Creatinine, Ser: 0.68 mg/dL (ref 0.40–1.20)
Sodium: 142 meq/L (ref 135–145)

## 2009-12-30 ENCOUNTER — Encounter: Payer: Self-pay | Admitting: Physician Assistant

## 2009-12-30 ENCOUNTER — Ambulatory Visit: Payer: Self-pay | Admitting: Cardiology

## 2009-12-30 DIAGNOSIS — R0602 Shortness of breath: Secondary | ICD-10-CM | POA: Insufficient documentation

## 2010-01-31 ENCOUNTER — Ambulatory Visit: Payer: Self-pay | Admitting: Cardiology

## 2010-01-31 ENCOUNTER — Ambulatory Visit (HOSPITAL_COMMUNITY): Admission: RE | Admit: 2010-01-31 | Discharge: 2010-01-31 | Payer: Self-pay | Admitting: Cardiology

## 2010-02-09 ENCOUNTER — Encounter (INDEPENDENT_AMBULATORY_CARE_PROVIDER_SITE_OTHER): Payer: Self-pay | Admitting: *Deleted

## 2010-02-09 ENCOUNTER — Ambulatory Visit: Payer: Self-pay | Admitting: Cardiology

## 2010-02-09 DIAGNOSIS — R9430 Abnormal result of cardiovascular function study, unspecified: Secondary | ICD-10-CM | POA: Insufficient documentation

## 2010-02-10 LAB — CONVERTED CEMR LAB
BUN: 14 mg/dL (ref 6–23)
Basophils Relative: 1.1 % (ref 0.0–3.0)
CO2: 26 meq/L (ref 19–32)
Chloride: 112 meq/L (ref 96–112)
Eosinophils Relative: 1.2 % (ref 0.0–5.0)
HCT: 33.7 % — ABNORMAL LOW (ref 36.0–46.0)
Lymphs Abs: 1.1 10*3/uL (ref 0.7–4.0)
MCV: 83.9 fL (ref 78.0–100.0)
Monocytes Absolute: 0.3 10*3/uL (ref 0.1–1.0)
Platelets: 246 10*3/uL (ref 150.0–400.0)
Potassium: 4 meq/L (ref 3.5–5.1)
RBC: 4.02 M/uL (ref 3.87–5.11)
WBC: 5 10*3/uL (ref 4.5–10.5)

## 2010-02-14 ENCOUNTER — Ambulatory Visit: Payer: Self-pay | Admitting: Cardiology

## 2010-02-14 ENCOUNTER — Inpatient Hospital Stay (HOSPITAL_BASED_OUTPATIENT_CLINIC_OR_DEPARTMENT_OTHER): Admission: RE | Admit: 2010-02-14 | Discharge: 2010-02-14 | Payer: Self-pay | Admitting: Cardiology

## 2010-02-15 ENCOUNTER — Telehealth: Payer: Self-pay | Admitting: Physician Assistant

## 2010-02-17 ENCOUNTER — Ambulatory Visit: Payer: Self-pay | Admitting: Physician Assistant

## 2010-02-18 ENCOUNTER — Encounter: Payer: Self-pay | Admitting: Physician Assistant

## 2010-02-20 ENCOUNTER — Telehealth: Payer: Self-pay | Admitting: Physician Assistant

## 2010-02-21 ENCOUNTER — Encounter: Payer: Self-pay | Admitting: Physician Assistant

## 2010-02-21 DIAGNOSIS — D509 Iron deficiency anemia, unspecified: Secondary | ICD-10-CM

## 2010-02-21 LAB — CONVERTED CEMR LAB
Basophils Absolute: 0 10*3/uL (ref 0.0–0.1)
Hemoglobin: 11.3 g/dL — ABNORMAL LOW (ref 12.0–15.0)
Lymphocytes Relative: 27 % (ref 12–46)
Monocytes Absolute: 0.3 10*3/uL (ref 0.1–1.0)
Monocytes Relative: 6 % (ref 3–12)
Neutro Abs: 3.3 10*3/uL (ref 1.7–7.7)
RBC: 4.32 M/uL (ref 3.87–5.11)
Retic Ct Pct: 1.4 % (ref 0.4–3.1)
Saturation Ratios: 11 % — ABNORMAL LOW (ref 20–55)
TIBC: 395 ug/dL (ref 250–470)

## 2010-02-23 ENCOUNTER — Telehealth: Payer: Self-pay | Admitting: Physician Assistant

## 2010-02-23 ENCOUNTER — Encounter: Payer: Self-pay | Admitting: Physician Assistant

## 2010-03-06 ENCOUNTER — Encounter: Payer: Self-pay | Admitting: Physician Assistant

## 2010-03-10 ENCOUNTER — Encounter: Payer: Self-pay | Admitting: Physician Assistant

## 2010-03-16 ENCOUNTER — Ambulatory Visit: Payer: Self-pay | Admitting: Cardiology

## 2010-04-04 ENCOUNTER — Encounter: Payer: Self-pay | Admitting: Physician Assistant

## 2010-05-02 ENCOUNTER — Telehealth: Payer: Self-pay | Admitting: Physician Assistant

## 2010-07-11 ENCOUNTER — Telehealth: Payer: Self-pay | Admitting: Physician Assistant

## 2010-08-10 DIAGNOSIS — N840 Polyp of corpus uteri: Secondary | ICD-10-CM

## 2010-08-10 HISTORY — DX: Polyp of corpus uteri: N84.0

## 2010-08-24 ENCOUNTER — Other Ambulatory Visit: Admission: RE | Admit: 2010-08-24 | Discharge: 2010-08-24 | Payer: Self-pay | Admitting: Family Medicine

## 2010-08-24 ENCOUNTER — Ambulatory Visit: Payer: Self-pay | Admitting: Physician Assistant

## 2010-08-24 DIAGNOSIS — R21 Rash and other nonspecific skin eruption: Secondary | ICD-10-CM | POA: Insufficient documentation

## 2010-08-24 DIAGNOSIS — N926 Irregular menstruation, unspecified: Secondary | ICD-10-CM | POA: Insufficient documentation

## 2010-08-24 DIAGNOSIS — R319 Hematuria, unspecified: Secondary | ICD-10-CM

## 2010-08-24 DIAGNOSIS — N939 Abnormal uterine and vaginal bleeding, unspecified: Secondary | ICD-10-CM

## 2010-08-24 DIAGNOSIS — H6980 Other specified disorders of Eustachian tube, unspecified ear: Secondary | ICD-10-CM

## 2010-08-24 LAB — CONVERTED CEMR LAB
Blood Glucose, Fingerstick: 101
KOH Prep: NEGATIVE
Nitrite: NEGATIVE
Whiff Test: NEGATIVE

## 2010-08-25 ENCOUNTER — Encounter: Payer: Self-pay | Admitting: Physician Assistant

## 2010-08-25 LAB — CONVERTED CEMR LAB
ALT: 9 units/L (ref 0–35)
AST: 11 units/L (ref 0–37)
Albumin: 3.8 g/dL (ref 3.5–5.2)
Alkaline Phosphatase: 87 units/L (ref 39–117)
Basophils Absolute: 0 10*3/uL (ref 0.0–0.1)
Basophils Relative: 0 % (ref 0–1)
Casts: NONE SEEN /lpf
Crystals: NONE SEEN
Eosinophils Absolute: 0.1 10*3/uL (ref 0.0–0.7)
Glucose, Bld: 108 mg/dL — ABNORMAL HIGH (ref 70–99)
LDL Cholesterol: 140 mg/dL — ABNORMAL HIGH (ref 0–99)
Lymphs Abs: 1.4 10*3/uL (ref 0.7–4.0)
MCV: 79 fL (ref 78.0–100.0)
Microalb, Ur: 9.63 mg/dL — ABNORMAL HIGH (ref 0.00–1.89)
Neutrophils Relative %: 64 % (ref 43–77)
Platelets: 292 10*3/uL (ref 150–400)
Potassium: 4.7 meq/L (ref 3.5–5.3)
RDW: 17.2 % — ABNORMAL HIGH (ref 11.5–15.5)
Sodium: 142 meq/L (ref 135–145)
Total Protein: 6.7 g/dL (ref 6.0–8.3)
VLDL: 15 mg/dL (ref 0–40)
WBC: 5.1 10*3/uL (ref 4.0–10.5)

## 2010-08-28 DIAGNOSIS — N390 Urinary tract infection, site not specified: Secondary | ICD-10-CM

## 2010-09-06 ENCOUNTER — Other Ambulatory Visit: Admission: RE | Admit: 2010-09-06 | Discharge: 2010-09-06 | Payer: Self-pay | Admitting: Obstetrics and Gynecology

## 2010-09-06 ENCOUNTER — Ambulatory Visit (HOSPITAL_COMMUNITY): Admission: RE | Admit: 2010-09-06 | Discharge: 2010-09-06 | Payer: Self-pay | Admitting: Internal Medicine

## 2010-09-06 ENCOUNTER — Ambulatory Visit: Payer: Self-pay | Admitting: Obstetrics and Gynecology

## 2010-09-11 ENCOUNTER — Ambulatory Visit (HOSPITAL_COMMUNITY): Admission: RE | Admit: 2010-09-11 | Discharge: 2010-09-11 | Payer: Self-pay | Admitting: Family Medicine

## 2010-09-13 ENCOUNTER — Encounter: Admission: RE | Admit: 2010-09-13 | Discharge: 2010-09-13 | Payer: Self-pay | Admitting: Internal Medicine

## 2010-09-14 ENCOUNTER — Encounter: Payer: Self-pay | Admitting: Physician Assistant

## 2010-09-26 ENCOUNTER — Telehealth: Payer: Self-pay | Admitting: Physician Assistant

## 2010-09-28 ENCOUNTER — Ambulatory Visit: Payer: Self-pay | Admitting: Obstetrics & Gynecology

## 2010-11-23 ENCOUNTER — Ambulatory Visit: Payer: Self-pay | Admitting: Physician Assistant

## 2010-12-10 DIAGNOSIS — I2699 Other pulmonary embolism without acute cor pulmonale: Secondary | ICD-10-CM

## 2010-12-10 DIAGNOSIS — I82409 Acute embolism and thrombosis of unspecified deep veins of unspecified lower extremity: Secondary | ICD-10-CM

## 2010-12-10 HISTORY — DX: Other pulmonary embolism without acute cor pulmonale: I26.99

## 2010-12-10 HISTORY — DX: Acute embolism and thrombosis of unspecified deep veins of unspecified lower extremity: I82.409

## 2010-12-12 ENCOUNTER — Ambulatory Visit (HOSPITAL_COMMUNITY)
Admission: RE | Admit: 2010-12-12 | Discharge: 2010-12-12 | Payer: Self-pay | Source: Home / Self Care | Attending: Family Medicine | Admitting: Family Medicine

## 2010-12-15 ENCOUNTER — Ambulatory Visit
Admission: RE | Admit: 2010-12-15 | Discharge: 2010-12-15 | Payer: Self-pay | Source: Home / Self Care | Attending: Obstetrics & Gynecology | Admitting: Obstetrics & Gynecology

## 2011-01-09 NOTE — Assessment & Plan Note (Signed)
Summary: ROV/DISCUSS MYOVIEW/DM   CC:  tingling in left arm.  History of Present Illness: Pleasant female that I saw in January of 2011 for evaluation of chest pain. Patient apparently had a cardiac catheterization in 2001 that showed normal coronary arteries. I do not have those records available. She had an echocardiogram performed in December of 2010 that showed normal LV function. She has been seen at health service recently for chest tightness and shortness of breath. A chest x-ray showed no edema and a BNP was normal. Her hemoglobin and TSH were normal. She was placed on diuretics with improvement in her symptoms. However because of these symptoms cardiology was asked to further evaluate. When I saw her previously we scheduled a Myoview for risk stratification. Ejection fraction was 55%. The study was limited by motion artifact. However there was a reversible defect in the anteroseptal wall and ischemia cannot be excluded. Since then she continues to have dyspnea on exertion relieved with rest. There is also orthopnea but there is no PND. She continues to have pedal edema. She has a tingling sensation in her left upper extremity but there is no clear or chest pain. There is no syncope.  Current Medications (verified): 1)  Furosemide 40 Mg Tabs (Furosemide) .... Take 1 Tablet By Mouth Two Times A Day (Pharmacy Note Dose Increase) 2)  Norvasc 10 Mg  Tabs (Amlodipine Besylate) .... Take 1 Tablet By Mouth Every Morning 3)  Prilosec 20 Mg  Cpdr (Omeprazole) .... .take 2 Tables  By Mouth Once A Day 4)  Topiramate 25 Mg Tabs (Topiramate) .Marland Kitchen.. 1 Tab By Mouth At Bedtime 5)  Shower Bench/chair 6)  Clonazepam 1 Mg  Tabs (Clonazepam) .... Take 1-2 Tablets By Mouth At Bedtime As Needed Insomnia. 7)  Glucophage Xr 500 Mg  Tb24 (Metformin Hcl) .... Take 1 Tablet By Mouth Every Morning For Diabetes 8)  Onetouch Test   Strp (Glucose Blood) .... Dx 250.00 Check Blood Sugar Twice Daily 9)  Lisinopril 10 Mg  Tabs  (Lisinopril) .... Take 1 Tab By Mouth in Morning. For Bp and Kidneys. 10)  Adult Aspirin Low Strength 81 Mg  Tbdp (Aspirin) .... Take 1 Tablet By Mouth Once A Day 11)  One-Touch Ultra Mini Lancets .... Use Bid 12)  Pravachol 20 Mg  Tabs (Pravastatin Sodium) .... Take 1 Tablet By Mouth Once A Day 13)  Nitrostat 0.4 Mg Subl (Nitroglycerin) .... Use One Tablet Under Your Mouth Every 5 Minutes As Needed For Cp 14)  Indocin 25 Mg/41ml Susp (Indomethacin) .... 2 Tab Spo Two Times A Day  Allergies: No Known Drug Allergies  Past History:  Past Medical History: Reviewed history from 12/30/2009 and no changes required. HYPERLIPIDEMIA (ICD-272.4) HYPERTENSION (ICD-401.9) INSOMNIA (ICD-780.52) H/O RETINAL DETATCHMENT MIGRAINE, CHRONIC (ICD-346.90) Diverticulosis DIABETES MELLITUS, TYPE II (ICD-250.00) DEGENERATIVE JOINT DISEASE, KNEE (ICD-715.96) MORBID OBESITY (ICD-278.01) SLEEP APNEA (ICD-780.57) GERD (ICD-530.81) Cardiac cath. 09/25/2000:  Normal cors; Normal LVF (Dr. Nicki Guadalajara)   a.  Celine Ahr pre-cath with poss anteroseptal ischemia, EF 49% (could not r/o breast attenuation) Echo 11/2009:  EF 55-60%; mild LVH; grade 1 diast. dysfxn  Past Surgical History: Reviewed history from 12/30/2009 and no changes required. tubal ligation cataract surgery  Social History: Reviewed history from 12/30/2009 and no changes required. Tobacco Use - No.  Divorced  Alcohol Use - no  Review of Systems       no fevers or chills, productive cough, hemoptysis, dysphasia, odynophagia, melena, hematochezia, dysuria, hematuria, rash, seizure activity,  PND,  claudication. Remaining systems are negative.   Vital Signs:  Patient profile:   60 year old female Height:      67 inches Weight:      370 pounds BMI:     58.16 Pulse rate:   86 / minute Resp:     20 per minute BP sitting:   126 / 85  (left arm)  Vitals Entered By: Kem Parkinson (February 09, 2010 8:56 AM)  Physical Exam  General:   Well-developed morbidly obese in no acute distress.  Skin is warm and dry.  HEENT is normal.  Neck is supple. No thyromegaly.  Chest is clear to auscultation with normal expansion.  Cardiovascular exam is regular rate and rhythm.  Abdominal exam nontender or distended. No masses palpated. Extremities show 2+ edema. neuro grossly intact    Impression & Recommendations:  Problem # 1:  CARDIOVASCULAR FUNCTION STUDY, ABNORMAL (ICD-794.30) Patient's Myoview is abnormal. This certainly may be related to shifting soft tissue attenuation. However she continued to describe significant dyspnea and occasional atypical left upper extremity pain. I will schedule a right and left heart catheterization both to exclude coronary disease but also to check right heart pressures. This will help understand potentially the mechanism of her edema. The risks and benefits have been discussed and she agrees to proceed.  Problem # 2:  DYSPNEA (ICD-786.05) Most likely multifactorial. LV function normal. Previous BNP and chest x-ray unremarkable. There may be a component of obesity hypoventilation syndrome and obstructive sleep apnea. Right heart catheterization as described above to exclude pulmonary hypertension. Her updated medication list for this problem includes:    Furosemide 40 Mg Tabs (Furosemide) .Marland Kitchen... Take 1 tablet by mouth two times a day (pharmacy note dose increase)    Norvasc 10 Mg Tabs (Amlodipine besylate) .Marland Kitchen... Take 1 tablet by mouth every morning    Lisinopril 10 Mg Tabs (Lisinopril) .Marland Kitchen... Take 1 tab by mouth in morning. for bp and kidneys.    Adult Aspirin Low Strength 81 Mg Tbdp (Aspirin) .Marland Kitchen... Take 1 tablet by mouth once a day  Orders: TLB-BMP (Basic Metabolic Panel-BMET) (80048-METABOL) TLB-CBC Platelet - w/Differential (85025-CBCD) TLB-PT (Protime) (85610-PTP) Cardiac Catheterization (Cardiac Cath)  Problem # 3:  HYPERLIPIDEMIA (ICD-272.4) Continue statin. Lipids and liver monitored by  primary care. Her updated medication list for this problem includes:    Pravachol 20 Mg Tabs (Pravastatin sodium) .Marland Kitchen... Take 1 tablet by mouth once a day  Problem # 4:  HYPERTENSION (ICD-401.9) Blood pressure controlled on present medications. Will continue. Her updated medication list for this problem includes:    Furosemide 40 Mg Tabs (Furosemide) .Marland Kitchen... Take 1 tablet by mouth two times a day (pharmacy note dose increase)    Norvasc 10 Mg Tabs (Amlodipine besylate) .Marland Kitchen... Take 1 tablet by mouth every morning    Lisinopril 10 Mg Tabs (Lisinopril) .Marland Kitchen... Take 1 tab by mouth in morning. for bp and kidneys.    Adult Aspirin Low Strength 81 Mg Tbdp (Aspirin) .Marland Kitchen... Take 1 tablet by mouth once a day  Problem # 5:  EDEMA (ICD-782.3) Continue diuretics. Right heart cath.  Problem # 6:  DIABETES MELLITUS, TYPE II (ICD-250.00) Management per primary care. Hold Glucophage 48 hours following catheter. Her updated medication list for this problem includes:    Glucophage Xr 500 Mg Tb24 (Metformin hcl) .Marland Kitchen... Take 1 tablet by mouth every morning for diabetes    Lisinopril 10 Mg Tabs (Lisinopril) .Marland Kitchen... Take 1 tab by mouth in morning. for bp and kidneys.  Adult Aspirin Low Strength 81 Mg Tbdp (Aspirin) .Marland Kitchen... Take 1 tablet by mouth once a day  Problem # 7:  MORBID OBESITY (ICD-278.01)  Problem # 8:  SLEEP APNEA (ICD-780.57)  Patient Instructions: 1)  Your physician recommends that you schedule a follow-up appointment in: 6 WEEKS 2)  Your physician has requested that you have a cardiac catheterization.  Cardiac catheterization is used to diagnose and/or treat various heart conditions. Doctors may recommend this procedure for a number of different reasons. The most common reason is to evaluate chest pain. Chest pain can be a symptom of coronary artery disease (CAD), and cardiac catheterization can show whether plaque is narrowing or blocking your heart's arteries. This procedure is also used to evaluate the  valves, as well as measure the blood flow and oxygen levels in different parts of your heart.  For further information please visit [email or web address]  Please follow instruction sheet, as given.

## 2011-01-09 NOTE — Letter (Signed)
Summary: *HSN Results Follow up  [facility]  [address] [place], [place] [address]   Phone: [phone]  Fax: [phone]      12/30/2009   [facility] [address] [place], [place]  [address]   Dear  Ms. Ercia Dewoody,                            ____S.Drinkard,FNP   ____D. Gore,FNP       ____B. McPherson,MD   ____V. Rankins,MD    ____E. Mulberry,MD    ____N. Daphine Deutscher, FNP  ____D. Reche Dixon, MD    ____K. Philipp Deputy, MD    __x__S. Alben Spittle, PA-C     This letter is to inform you that your recent test(s):  _______Pap Smear    ___x____Lab Test     _______X-ray    __x___ is within acceptable limits  _______ requires a medication change  _______ requires a follow-up lab visit  _______ requires a follow-up visit with your provider   Comments:       _________________________________________________________ If you have any questions, please contact our office                     Sincerely,  Tereso Newcomer PA-C [facility]

## 2011-01-09 NOTE — Letter (Signed)
Summary: Cardiac Catheterization Instructions- [facility]  [facility], Main Office  [address]   [place], [place] [address]   Phone: [phone]  Fax: [phone]     02/09/2010 MRN: [id number]  [facility] [address] [place], [place]  [address]  Dear Ms. Weitman,   You are scheduled for a Cardiac Catheterization on TUESDAY 02-14-10 with Dr.BRODIE Please arrive to the 1st floor of the [facility] at [facility] at     9:30 am        on the day of your procedure. Please do not arrive before 6:30 a.m. Call the [facility] Center at [phone] if you are unable to make your appointmnet. The Code to get into the parking garage under the building is 9000. Take the elevators to the 1st floor. You must have someone to drive you home. Someone must be with you for the first 24 hours after you arrive home. Please wear clothes that are easy to get on and off and wear slip-on shoes. Do not eat or drink after midnight except water with your medications that morning. Bring all your medications and current insurance cards with you.  _XXX__ DO NOT take these medications before your procedure: DO NOT TAKE FUROSEMIDE OR GLUCOPHAGE TUESDAY MORNING  ___ Make sure you take your aspirin.  ___ You may take ALL of your medications with water that morning. ________________________________________________________________________________________________________________________________  ___ DO NOT take ANY medications before your procedure.  ___ Pre-med instructions:  ________________________________________________________________________________________________________________________________  The usual length of stay after your procedure is 2 to 3 hours. This can vary.  If you have any questions, please call the office at the number listed above.   Deliah Goody, RN

## 2011-01-09 NOTE — Assessment & Plan Note (Signed)
Summary: eph/jml   History of Present Illness: Pleasant female that I had seen for dyspnea and chest pain. She had an echocardiogram performed in December of 2010 that showed normal LV function. She has been seen at health service recently for chest tightness and shortness of breath. A chest x-ray showed no edema and a BNP was normal. Her hemoglobin and TSH were normal. She was placed on diuretics with improvement in her symptoms. A Myoview showed an  Ejection fraction was 55%. The study was limited by motion artifact. However there was a reversible defect in the anteroseptal wall and ischemia cannot be excluded. A cardiac catheterization was therefore performed in March of 2011. LV function was normal and her coronaries were normal. Her left ventricular filling pressures were normal as was her pulmonary pressures. Since then the patient has dyspnea with more extreme activities but not with routine activities. It is relieved with rest. It is not associated with chest pain. There is no orthopnea or PND. There is no syncope or palpitations. There is no exertional chest pain. She does have mild pedal edema that persists.   Current Medications (verified): 1)  Furosemide 40 Mg Tabs (Furosemide) .... Take 1 Tablet By Mouth Two Times A Day (Pharmacy Note Dose Increase) 2)  Norvasc 10 Mg  Tabs (Amlodipine Besylate) .... Take 1 Tablet By Mouth Every Morning 3)  Prilosec 20 Mg  Cpdr (Omeprazole) .... .take 2 Tables  By Mouth Once A Day 4)  Topiramate 25 Mg Tabs (Topiramate) .Marland Kitchen.. 1 Tab By Mouth At Bedtime 5)  Shower Bench/chair 6)  Clonazepam 1 Mg  Tabs (Clonazepam) .... Take 1-2 Tablets By Mouth At Bedtime As Needed Insomnia. 7)  Glucophage Xr 500 Mg  Tb24 (Metformin Hcl) .... Take 1 Tablet By Mouth Every Morning For Diabetes 8)  Onetouch Test   Strp (Glucose Blood) .... Dx 250.00 Check Blood Sugar Twice Daily 9)  Lisinopril 10 Mg  Tabs (Lisinopril) .... Take 1 Tab By Mouth in Morning. For Bp and Kidneys. 10)   Adult Aspirin Low Strength 81 Mg  Tbdp (Aspirin) .... Take 1 Tablet By Mouth Once A Day 11)  One-Touch Ultra Mini Lancets .... Use Bid 12)  Pravachol 20 Mg  Tabs (Pravastatin Sodium) .... Take 1 Tablet By Mouth Once A Day 13)  Nitrostat 0.4 Mg Subl (Nitroglycerin) .... Use One Tablet Under Your Mouth Every 5 Minutes As Needed For Cp 14)  Indocin 25 Mg/50ml Susp (Indomethacin) .... 2 Tab Spo Two Times A Day 15)  Nu-Iron 150 Mg Caps (Polysaccharide Iron Complex) .... Take 1 Tablet By Mouth Two Times A Day 16)  Onetouch Basic System W/device Kit (Blood Glucose Monitoring Suppl) .... As Directed  Allergies: No Known Drug Allergies  Past History:  Past Medical History: Reviewed history from 12/30/2009 and no changes required. HYPERLIPIDEMIA (ICD-272.4) HYPERTENSION (ICD-401.9) INSOMNIA (ICD-780.52) H/O RETINAL DETATCHMENT MIGRAINE, CHRONIC (ICD-346.90) Diverticulosis DIABETES MELLITUS, TYPE II (ICD-250.00) DEGENERATIVE JOINT DISEASE, KNEE (ICD-715.96) MORBID OBESITY (ICD-278.01) SLEEP APNEA (ICD-780.57) GERD (ICD-530.81) Cardiac cath. 09/25/2000:  Normal cors; Normal LVF (Dr. Nicki Guadalajara)   a.  Celine Ahr pre-cath with poss anteroseptal ischemia, EF 49% (could not r/o breast attenuation) Echo 11/2009:  EF 55-60%; mild LVH; grade 1 diast. dysfxn  Past Surgical History: Reviewed history from 12/30/2009 and no changes required. tubal ligation cataract surgery  Social History: Reviewed history from 12/30/2009 and no changes required. Tobacco Use - No.  Divorced  Alcohol Use - no  Review of Systems  Significant arthralgias but no fevers or chills, productive cough, hemoptysis, dysphasia, odynophagia, melena, hematochezia, dysuria, hematuria, rash, seizure activity, orthopnea, PND,  claudication. Remaining systems are negative.   Vital Signs:  Patient profile:   60 year old female Height:      67 inches Weight:      370 pounds BMI:     58.16 Pulse rate:   84 /  minute Resp:     18 per minute BP sitting:   140 / 80  (left arm)  Vitals Entered By: Kem Parkinson (March 16, 2010 2:56 PM)  Physical Exam  General:  Well-developed morbidly obese in no acute distress.  Skin is warm and dry.  HEENT is normal.  Neck is supple. No thyromegaly.  Chest is clear to auscultation with normal expansion.  Cardiovascular exam is regular rate and rhythm.  Abdominal exam nontender or distended. No masses palpated. Extremities show trace edema. neuro grossly intact    Impression & Recommendations:  Problem # 1:  DYSPNEA (ICD-786.05) Etiology unclear. Note her LV function is normal and there is no coronary disease. Her pulmonary pressures and filling pressures are also normal. There may be a component of obesity hypoventilation syndrome and obstructive sleep apnea. No further workup at this time. Her updated medication list for this problem includes:    Furosemide 40 Mg Tabs (Furosemide) .Marland Kitchen... Take 1 tablet by mouth two times a day (pharmacy note dose increase)    Norvasc 10 Mg Tabs (Amlodipine besylate) .Marland Kitchen... Take 1 tablet by mouth every morning    Lisinopril 10 Mg Tabs (Lisinopril) .Marland Kitchen... Take 1 tab by mouth in morning. for bp and kidneys.    Adult Aspirin Low Strength 81 Mg Tbdp (Aspirin) .Marland Kitchen... Take 1 tablet by mouth once a day  Problem # 2:  HYPERLIPIDEMIA (ICD-272.4) Followup lipids and liver with primary care. Her updated medication list for this problem includes:    Pravachol 20 Mg Tabs (Pravastatin sodium) .Marland Kitchen... Take 1 tablet by mouth once a day  Problem # 3:  HYPERTENSION (ICD-401.9) Continue present blood pressure medications. Primary care we'll follow this and increase as needed. Her updated medication list for this problem includes:    Furosemide 40 Mg Tabs (Furosemide) .Marland Kitchen... Take 1 tablet by mouth two times a day (pharmacy note dose increase)    Norvasc 10 Mg Tabs (Amlodipine besylate) .Marland Kitchen... Take 1 tablet by mouth every morning    Lisinopril  10 Mg Tabs (Lisinopril) .Marland Kitchen... Take 1 tab by mouth in morning. for bp and kidneys.    Adult Aspirin Low Strength 81 Mg Tbdp (Aspirin) .Marland Kitchen... Take 1 tablet by mouth once a day  Problem # 4:  ANEMIA, IRON DEFICIENCY (ICD-280.9) Being evaluated by primary care.  Problem # 5:  CHEST PAIN UNSPECIFIED (ICD-786.50) No further symptoms. Catheterization revealed normal coronary arteries. Her updated medication list for this problem includes:    Norvasc 10 Mg Tabs (Amlodipine besylate) .Marland Kitchen... Take 1 tablet by mouth every morning    Lisinopril 10 Mg Tabs (Lisinopril) .Marland Kitchen... Take 1 tab by mouth in morning. for bp and kidneys.    Adult Aspirin Low Strength 81 Mg Tbdp (Aspirin) .Marland Kitchen... Take 1 tablet by mouth once a day    Nitrostat 0.4 Mg Subl (Nitroglycerin) ..... Use one tablet under your mouth every 5 minutes as needed for cp  Problem # 6:  DIABETES MELLITUS, TYPE II (ICD-250.00)  Her updated medication list for this problem includes:    Glucophage Xr 500 Mg Tb24 (Metformin hcl) .Marland KitchenMarland KitchenMarland KitchenMarland Kitchen  Take 1 tablet by mouth every morning for diabetes    Lisinopril 10 Mg Tabs (Lisinopril) .Marland Kitchen... Take 1 tab by mouth in morning. for bp and kidneys.    Adult Aspirin Low Strength 81 Mg Tbdp (Aspirin) .Marland Kitchen... Take 1 tablet by mouth once a day  Problem # 7:  SLEEP APNEA (ICD-780.57)  Problem # 8:  GERD (ICD-530.81)  Her updated medication list for this problem includes:    Prilosec 20 Mg Cpdr (Omeprazole) ..... Marland Kitchentake 2 tables  by mouth once a day  Problem # 9:  EDEMA (ICD-782.3) Continue diuretics. Followup primary care.  Patient Instructions: 1)  Your physician recommends that you schedule a follow-up appointment in: as needed

## 2011-01-09 NOTE — Letter (Signed)
Summary: REFERRAL/CARDIOLOGY//CHEST PAIN//APPT DATE & TIME  REFERRAL/CARDIOLOGY//CHEST PAIN//APPT DATE & TIME   Imported By: Arta Bruce 01/06/2010 12:44:35  _____________________________________________________________________  External Attachment:    Type:   Image     Comment:   External Document

## 2011-01-09 NOTE — Progress Notes (Signed)
  Phone Note Outgoing Call   Summary of Call: The patient was supposed to f/u with me about a month ago.  I still have not seen her. Please arrange f/u. Initial call taken by: Brynda Rim,  May 02, 2010 11:08 PM  Follow-up for Phone Call        spoke with pt and appt is schedule on june 7.. pt wanted early morning and thats the earliest you had for cpp Follow-up by: [place] Shannon,  May 03, 2010 10:07 AM

## 2011-01-09 NOTE — Progress Notes (Signed)
Summary: Needs R diag mammo in APR 2012  Phone Note Outgoing Call   Summary of Call: Make sure patient has f/u in 6 months at [facility] for right diag mammo arranged.  Initial call taken by: Brynda Rim,  September 26, 2010 12:06 PM  Follow-up for Phone Call        pt aware Follow-up by: [place] Shannon,  September 26, 2010 12:12 PM

## 2011-01-09 NOTE — Assessment & Plan Note (Signed)
Summary: NP6/CHEST PAIN/LG   CC:  chest pains pt takes nitro which helps the pain.  History of Present Illness: 60 yo female for evaluation of chest pain. Patient apparently had a cardiac catheterization in 2001 that showed normal coronary arteries. I do not have those records available. She had an echocardiogram performed in December of 2010 that showed normal LV function. She has been seen at health service recently for chest tightness and shortness of breath. A chest x-ray showed no edema and a BNP was normal. Her hemoglobin and TSH were normal. She was placed on diuretics with improvement in her symptoms. However because of these symptoms cardiology was asked to further evaluate. The patient states that she has had dyspnea for approximately 2 years. It is predominately with more extreme activities. It is relieved with rest. It is not associated with chest pain. There is no orthopnea or PND but there is pedal edema. Her dyspnea has improved with recent increased dose of Lasix. She also has occasional chest pain. It is described as a tightness and in the left chest area. It does not radiate. It is not pleuritic or positional nor is it related to food. It is not clearly exertional. There is no associated symptoms. It lasts for approximately 2 minutes. It sometimes improves with belching.  Current Medications (verified): 1)  Furosemide 40 Mg Tabs (Furosemide) .... Take 1 Tablet By Mouth Two Times A Day (Pharmacy Note Dose Increase) 2)  Norvasc 10 Mg  Tabs (Amlodipine Besylate) .... Take 1 Tablet By Mouth Every Morning 3)  Prilosec 20 Mg  Cpdr (Omeprazole) .... .take 2 Tables  By Mouth Once A Day 4)  Topamax 50 Mg  Tabs (Topiramate) .... Take 1 Tablet At Bedtime(Per Dr.dohmeier) 5)  Shower Bench/chair 6)  Clonazepam 1 Mg  Tabs (Clonazepam) .... Take 1-2 Tablets By Mouth At Bedtime As Needed Insomnia. 7)  Glucophage Xr 500 Mg  Tb24 (Metformin Hcl) .... Take 1 Tablet By Mouth Every Morning For  Diabetes 8)  Onetouch Test   Strp (Glucose Blood) .... Dx 250.00 Check Blood Sugar Twice Daily 9)  Lisinopril 10 Mg  Tabs (Lisinopril) .... Take 1 Tab By Mouth in Morning. For Bp and Kidneys. 10)  Adult Aspirin Low Strength 81 Mg  Tbdp (Aspirin) .... Take 1 Tablet By Mouth Once A Day 11)  One-Touch Ultra Mini Lancets .... Use Bid 12)  Pravachol 20 Mg  Tabs (Pravastatin Sodium) .... Take 1 Tablet By Mouth Once A Day 13)  Nitrostat 0.4 Mg Subl (Nitroglycerin) .... Use One Tablet Under Your Mouth Every 5 Minutes As Needed For Cp 14)  Indocin 25 Mg/64ml Susp (Indomethacin) .... 2 Tab Spo Two Times A Day  Allergies: No Known Drug Allergies  Past History:  Past Medical History: HYPERLIPIDEMIA (ICD-272.4) HYPERTENSION (ICD-401.9) INSOMNIA (ICD-780.52) H/O RETINAL DETATCHMENT MIGRAINE, CHRONIC (ICD-346.90) Diverticulosis DIABETES MELLITUS, TYPE II (ICD-250.00) DEGENERATIVE JOINT DISEASE, KNEE (ICD-715.96) MORBID OBESITY (ICD-278.01) SLEEP APNEA (ICD-780.57) GERD (ICD-530.81) Cardiac cath. 09/25/2000:  Normal cors; Normal LVF (Dr. Nicki Guadalajara)   a.  Celine Ahr pre-cath with poss anteroseptal ischemia, EF 49% (could not r/o breast attenuation) Echo 11/2009:  EF 55-60%; mild LVH; grade 1 diast. dysfxn  Past Surgical History: tubal ligation cataract surgery  Family History: Reviewed history from 11/08/2009 and no changes required. Family History of CAD Female 1st degree relative <60 (sis died in 1s with MI) Father with heart disease of unknown type  Social History: Reviewed history from 12/27/2009 and no changes required. Tobacco Use -  No.  Divorced  Alcohol Use - no  Review of Systems       Problems with Arthralgias but no fevers or chills, productive cough, hemoptysis, dysphasia, odynophagia, melena, hematochezia, dysuria, hematuria, rash, seizure activity, orthopnea, PND,  claudication. Remaining systems are negative.   Vital Signs:  Patient profile:   60 year old  female Height:      67 inches Weight:      368 pounds BMI:     57.85 Pulse rate:   97 / minute Resp:     20 per minute BP sitting:   130 / 85  (left arm)  Vitals Entered By: Kem Parkinson (December 30, 2009 10:27 AM)  Physical Exam  General:  Exam difficult and performed in a chair as the patient could not get on the examining table. Well developed/morbidly obese in NAD Skin warm/dry Patient not depressed No peripheral clubbing Back-normal HEENT-normal/normal eyelids Neck supple/normal carotid upstroke bilaterally; no bruits; no JVD; no thyromegaly chest - CTA/ normal expansion CV - RRR/normal S1 and S2; no murmurs, rubs or gallops; PMI nondisplaced Abdomen -difficult due to obesity, NT/ND, no HSM, no mass, + bowel sounds, no bruit femoral pulses not palpated Ext-1+ ankle edema, chords, 2+ DP Neuro-grossly nonfocal     EKG  Procedure date:  12/30/2009  Findings:      Sinus rhythm at a rate of 97. Axis normal. Left ventricular hypertrophy. Nonspecific ST changes.  Impression & Recommendations:  Problem # 1:  CHEST PAIN UNSPECIFIED (ICD-786.50) Symptoms atypical. We'll schedule adenosine Myoview for risk stratification. Her updated medication list for this problem includes:    Norvasc 10 Mg Tabs (Amlodipine besylate) .Marland Kitchen... Take 1 tablet by mouth every morning    Lisinopril 10 Mg Tabs (Lisinopril) .Marland Kitchen... Take 1 tab by mouth in morning. for bp and kidneys.    Adult Aspirin Low Strength 81 Mg Tbdp (Aspirin) .Marland Kitchen... Take 1 tablet by mouth once a day    Nitrostat 0.4 Mg Subl (Nitroglycerin) ..... Use one tablet under your mouth every 5 minutes as needed for cp  Orders: Nuclear Stress Test (Nuc Stress Test)  Problem # 2:  DYSPNEA (ICD-786.05) This is most likely multifactorial. Her symptoms have improved with higher doses of diuretic and there may be a component of diastolic dysfunction. However there is also a contribution from obesity hypoventilation syndrome and  obstructive sleep apnea. Would continue with present dose of Lasix. Her updated medication list for this problem includes:    Furosemide 40 Mg Tabs (Furosemide) .Marland Kitchen... Take 1 tablet by mouth two times a day (pharmacy note dose increase)    Norvasc 10 Mg Tabs (Amlodipine besylate) .Marland Kitchen... Take 1 tablet by mouth every morning    Lisinopril 10 Mg Tabs (Lisinopril) .Marland Kitchen... Take 1 tab by mouth in morning. for bp and kidneys.    Adult Aspirin Low Strength 81 Mg Tbdp (Aspirin) .Marland Kitchen... Take 1 tablet by mouth once a day  Problem # 3:  HYPERLIPIDEMIA (ICD-272.4) Continue statin. Lipids and liver monitored by primary care. Her updated medication list for this problem includes:    Pravachol 20 Mg Tabs (Pravastatin sodium) .Marland Kitchen... Take 1 tablet by mouth once a day  Problem # 4:  HYPERTENSION (ICD-401.9) Blood pressure controlled on present medications. Will continue. Her updated medication list for this problem includes:    Furosemide 40 Mg Tabs (Furosemide) .Marland Kitchen... Take 1 tablet by mouth two times a day (pharmacy note dose increase)    Norvasc 10 Mg Tabs (Amlodipine besylate) .Marland Kitchen... Take 1  tablet by mouth every morning    Lisinopril 10 Mg Tabs (Lisinopril) .Marland Kitchen... Take 1 tab by mouth in morning. for bp and kidneys.    Adult Aspirin Low Strength 81 Mg Tbdp (Aspirin) .Marland Kitchen... Take 1 tablet by mouth once a day  Problem # 5:  EDEMA (ICD-782.3) Continue present dose of Lasix.  Problem # 6:  DIABETES MELLITUS, TYPE II (ICD-250.00)  Her updated medication list for this problem includes:    Glucophage Xr 500 Mg Tb24 (Metformin hcl) .Marland Kitchen... Take 1 tablet by mouth every morning for diabetes    Lisinopril 10 Mg Tabs (Lisinopril) .Marland Kitchen... Take 1 tab by mouth in morning. for bp and kidneys.    Adult Aspirin Low Strength 81 Mg Tbdp (Aspirin) .Marland Kitchen... Take 1 tablet by mouth once a day  Problem # 7:  MORBID OBESITY (ICD-278.01) Patient counseled on weight loss.  Problem # 8:  SLEEP APNEA (ICD-780.57)  Problem # 9:  GERD  (ICD-530.81)  Her updated medication list for this problem includes:    Prilosec 20 Mg Cpdr (Omeprazole) ..... Marland Kitchentake 2 tables  by mouth once a day  Patient Instructions: 1)  Your physician recommends that you schedule a follow-up appointment in: AS NEEDED PENDING TEST RESULTS 2)  Your physician has requested that you have an LEXISCAN  myoview.  For further information please visit [email or web address]  Please follow instruction sheet, as given.

## 2011-01-09 NOTE — Letter (Signed)
Summary: TEST ORDER FROM//MAMMOGRAM//APPT DATE & TIM E  TEST ORDER FROM//MAMMOGRAM//APPT DATE & TIM E   Imported By: Arta Bruce 01/02/2010 15:06:59  _____________________________________________________________________  External Attachment:    Type:   Image     Comment:   External Document

## 2011-01-09 NOTE — Letter (Signed)
Summary: NUTRITION & DIABETES//NO SHOWED  NUTRITION & DIABETES//NO SHOWED   Imported By: Arta Bruce 04/10/2010 11:34:15  _____________________________________________________________________  External Attachment:    Type:   Image     Comment:   External Document

## 2011-01-09 NOTE — Progress Notes (Signed)
  Phone Note Outgoing Call   Summary of Call: Patient had recent labs for heart cath. Her hgb is lower than normal. She needs to come in for f/u to address this. She has CPP scheduled today.  She had a heart cath yest. . . not sure if she is still coming or not. If she cancels or no shows, make sure we get her in to the lab this week for: CBC with diff retic count B12 folate Iron TIBC %sat Ferritin Stool cards x 3 Initial call taken by: Tereso Newcomer PA-C,  February 15, 2010 8:08 AM  Follow-up for Phone Call        appt is scheduled... Follow-up by: [place] Shannon,  February 15, 2010 11:34 AM

## 2011-01-09 NOTE — Cardiovascular Report (Signed)
Summary: Pre Cath Orders  Pre Cath Orders   Imported By: Roderic Ovens 02/24/2010 15:31:26  _____________________________________________________________________  External Attachment:    Type:   Image     Comment:   External Document

## 2011-01-09 NOTE — Progress Notes (Signed)
  Phone Note Outgoing Call   Summary of Call: Need paper chart to look at colo report in 2007. Initial call taken by: Tereso Newcomer PA-C,  February 20, 2010 8:11 AM  Follow-up for Phone Call        ok...Marland KitchenMarland Kitchen put on your desk Follow-up by: [place] Shannon,  February 20, 2010 12:37 PM

## 2011-01-09 NOTE — Miscellaneous (Signed)
Summary: COLO REPORT from 2007; NEEDS REPEAT 2012  Clinical Lists Changes  Observations: Added new observation of COLONRECACT: Repeat colonoscopy in 5 years due to FHx of colorectal CA. (05/16/2006 16:28) Added new observation of COLONOSCOPY:  Results: Diverticulosis.       Location:  [facility].   (05/16/2006 16:28)      Colonoscopy  Procedure date:  05/16/2006  Findings:       Results: Diverticulosis.       Location:  [facility].    Comments:      Repeat colonoscopy in 5 years due to FHx of colorectal CA.

## 2011-01-09 NOTE — Miscellaneous (Signed)
Summary: Eye Doctor   Clinical Lists Changes  Observations: Added new observation of DIAB EYE EX: Dr. Ernesto Rutherford (02/21/2010 16:34)       Impression & Recommendations:  Problem # 1:  * H/O RETINAL DETATCHMENT f/u with Dr. Dione Booze 3.15.2011 had yag on OD f/u in one year planned  Complete Medication List: 1)  Furosemide 40 Mg Tabs (Furosemide) .... Take 1 tablet by mouth two times a day (pharmacy note dose increase) 2)  Norvasc 10 Mg Tabs (Amlodipine besylate) .... Take 1 tablet by mouth every morning 3)  Prilosec 20 Mg Cpdr (Omeprazole) .... .take 2 tables  by mouth once a day 4)  Topiramate 25 Mg Tabs (Topiramate) .Marland Kitchen.. 1 tab by mouth at bedtime 5)  Shower Bench/chair  6)  Clonazepam 1 Mg Tabs (Clonazepam) .... Take 1-2 tablets by mouth at bedtime as needed insomnia. 7)  Glucophage Xr 500 Mg Tb24 (Metformin hcl) .... Take 1 tablet by mouth every morning for diabetes 8)  Onetouch Test Strp (Glucose blood) .... Dx 250.00 check blood sugar twice daily 9)  Lisinopril 10 Mg Tabs (Lisinopril) .... Take 1 tab by mouth in morning. for bp and kidneys. 10)  Adult Aspirin Low Strength 81 Mg Tbdp (Aspirin) .... Take 1 tablet by mouth once a day 11)  One-touch Ultra Mini Lancets  .... Use bid 12)  Pravachol 20 Mg Tabs (Pravastatin sodium) .... Take 1 tablet by mouth once a day 13)  Nitrostat 0.4 Mg Subl (Nitroglycerin) .... Use one tablet under your mouth every 5 minutes as needed for cp 14)  Indocin 25 Mg/57ml Susp (Indomethacin) .... 2 tab spo two times a day 15)  Nu-iron 150 Mg Caps (Polysaccharide iron complex) .... Take 1 tablet by mouth two times a day 16)  Onetouch Basic System W/device Kit (Blood glucose monitoring suppl) .... As directed

## 2011-01-09 NOTE — Letter (Signed)
Summary: *HSN Results Follow up  [facility]  [address] [place], [place] [address]   Phone: [phone]  Fax: [phone]      03/06/2010   [facility] [address] [place], [place]  [address]   Dear  Ms. Dorthy Paccione,                            ____S.Drinkard,FNP   ____D. Gore,FNP       ____B. McPherson,MD   ____V. Rankins,MD    ____E. Mulberry,MD    ____N. Daphine Deutscher, FNP  ____D. Reche Dixon, MD    ____K. Philipp Deputy, MD    __x__S. Alben Spittle, PA-C    This letter is to inform you that your recent test(s):  _______Pap Smear    _______Lab Test     _______X-ray    _______ is within acceptable limits  _______ requires a medication change  _______ requires a follow-up lab visit  _______ requires a follow-up visit with your provider   Comments: Stool tests were negative for blood.       _________________________________________________________ If you have any questions, please contact our office                     Sincerely,  Tereso Newcomer PA-C [facility]

## 2011-01-09 NOTE — Letter (Signed)
Summary: TETS ORDER FORM//2-D ECHO//APPT DATE & TIME  TETS ORDER FORM//2-D ECHO//APPT DATE & TIME   Imported By: Arta Bruce 12/29/2009 15:23:16  _____________________________________________________________________  External Attachment:    Type:   Image     Comment:   External Document

## 2011-01-09 NOTE — Letter (Signed)
Summary: [facility]  [facility]   Imported By: Arta Bruce 03/13/2010 10:04:18  _____________________________________________________________________  External Attachment:    Type:   Image     Comment:   External Document

## 2011-01-09 NOTE — Progress Notes (Signed)
Summary: Need to do stool cards  Phone Note Outgoing Call   Summary of Call: Make sure she does stool cards before she comes in for appt in April. Initial call taken by: Brynda Rim,  February 23, 2010 4:38 PM  Follow-up for Phone Call        pt aware Follow-up by: [place] Shannon,  February 24, 2010 10:35 AM     Appended Document: hemoccult results  Laboratory Results    Stool - Occult Blood Hemmoccult #1: negative Date: 03/03/2010 Hemoccult #2: negative Date: 03/03/2010 Hemoccult #3: negative Date: 03/03/2010

## 2011-01-09 NOTE — Letter (Signed)
Summary: ECHO  ECHO   Imported By: Arta Bruce 01/24/2010 15:18:39  _____________________________________________________________________  External Attachment:    Type:   Image     Comment:   External Document

## 2011-01-09 NOTE — Assessment & Plan Note (Signed)
Summary: cpp///cns   Vital Signs:  Patient profile:   60 year old female Weight:      373.4 pounds BMI:     58.69 Temp:     97.3 degrees F oral Pulse rate:   80 / minute Pulse rhythm:   regular Resp:     20 per minute BP sitting:   130 / 80  (left arm) Cuff size:   large  Vitals Entered By: Levon Hedger (August 24, 2010 8:35 AM) CC: CPP...ringing in the ear, Headache, Hypertension Management Is Patient Diabetic? Yes Pain Assessment Patient in pain? no      CBG Result 101 CBG Device ID B  Does patient need assistance? Functional Status Self care Ambulation Normal   Primary Care Provider:  Tereso Newcomer PA-C  CC:  CPP...ringing in the ear, Headache, and Hypertension Management.  History of Present Illness: Health maint: No abnormal paps. Has noted slight vaginal bleeding for a couple months when she wipes.   Not clear when she had menopause.  Has noted spotting off and on. No vaginal discharge or burning or itching.   Hypertension History:      Positive major cardiovascular risk factors include female age 65 years old or older, diabetes, hyperlipidemia, and hypertension.  Negative major cardiovascular risk factors include non-tobacco-user status.     Habits & Providers  Exercise-Depression-Behavior     Have you felt down or hopeless? no     Have you felt little pleasure in things? no  Problems Prior to Update: 1)  Hematuria Unspecified  (ICD-599.70) 2)  Facial Rash  (ICD-782.1) 3)  Eustachian Tube Dysfunction, Right  (ICD-381.81) 4)  Vaginal Bleeding, Abnormal  (ICD-626.9) 5)  Preventive Health Care  (ICD-V70.0) 6)  Anemia, Iron Deficiency  (ICD-280.9) 7)  Cardiovascular Function Study, Abnormal  (ICD-794.30) 8)  Dyspnea  (ICD-786.05) 9)  Hyperlipidemia  (ICD-272.4) 10)  Fatigue  (ICD-780.79) 11)  Hypertension  (ICD-401.9) 12)  Chest Pain Unspecified  (ICD-786.50) 13)  Edema  (ICD-782.3) 14)  Family History of Cad Female 1st Degree Relative <60   (ICD-V16.49) 15)  Carotid Bruit, Right  (ICD-785.9) 16)  Acute Sinusitis, Unspecified  (ICD-461.9) 17)  Uri  (ICD-465.9) 18)  Screening For Mlig Neop, Breast, Nos  (ICD-V76.10) 19)  Insomnia  (ICD-780.52) 20)  H/o Retinal Detatchment  () 21)  Migraine, Chronic  (ICD-346.90) 22)  Tubal Ligation, Hx of  (ICD-V26.51) 23)  Diverticulosis, Colon  (ICD-562.10) 24)  Diabetes Mellitus, Type II  (ICD-250.00) 25)  Degenerative Joint Disease, Knee  (ICD-715.96) 26)  Morbid Obesity  (ICD-278.01) 27)  Headache, Chronic  (ICD-784.0) 28)  Pharyngitis  (ICD-462) 29)  Sleep Apnea  (ICD-780.57) 30)  Gerd  (ICD-530.81)  Current Medications (verified): 1)  Furosemide 40 Mg Tabs (Furosemide) .... Take 1 Tablet By Mouth Two Times A Day (Pharmacy Note Dose Increase) 2)  Norvasc 10 Mg  Tabs (Amlodipine Besylate) .... Take 1 Tablet By Mouth Every Morning 3)  Prilosec 20 Mg  Cpdr (Omeprazole) .... .take 2 Tables  By Mouth Once A Day 4)  Topiramate 25 Mg Tabs (Topiramate) .Marland Kitchen.. 1 Tab By Mouth At Bedtime 5)  Shower Bench/chair 6)  Clonazepam 1 Mg  Tabs (Clonazepam) .... Take 1-2 Tablets By Mouth At Bedtime As Needed Insomnia. 7)  Glucophage Xr 500 Mg  Tb24 (Metformin Hcl) .... Take 1 Tablet By Mouth Every Morning For Diabetes 8)  Onetouch Test   Strp (Glucose Blood) .... Dx 250.00 Check Blood Sugar Twice Daily 9)  Lisinopril 10 Mg  Tabs (Lisinopril) .... Take 1 Tab By Mouth in Morning. For Bp and Kidneys. 10)  Adult Aspirin Low Strength 81 Mg  Tbdp (Aspirin) .... Take 1 Tablet By Mouth Once A Day 11)  One-Touch Ultra Mini Lancets .... Use Bid 12)  Pravachol 20 Mg  Tabs (Pravastatin Sodium) .... Take 1 Tablet By Mouth Once A Day 13)  Nitrostat 0.4 Mg Subl (Nitroglycerin) .... Use One Tablet Under Your Mouth Every 5 Minutes As Needed For Cp 14)  Indocin 25 Mg/48ml Susp (Indomethacin) .... 2 Tab Spo Two Times A Day 15)  Nu-Iron 150 Mg Caps (Polysaccharide Iron Complex) .... Take 1 Tablet By Mouth Two Times A  Day 16)  Onetouch Basic System W/device Kit (Blood Glucose Monitoring Suppl) .... As Directed  Allergies (verified): No Known Drug Allergies  Past History:  Past Medical History: HYPERLIPIDEMIA (ICD-272.4) HYPERTENSION (ICD-401.9) INSOMNIA (ICD-780.52) H/O RETINAL DETATCHMENT MIGRAINE, CHRONIC (ICD-346.90) Diverticulosis DIABETES MELLITUS, TYPE II (ICD-250.00) DEGENERATIVE JOINT DISEASE, KNEE (ICD-715.96) MORBID OBESITY (ICD-278.01) SLEEP APNEA (ICD-780.57) GERD (ICD-530.81) Cardiac cath. 09/25/2000:  Normal cors; Normal LVF (Dr. Nicki Guadalajara)   a.  Celine Ahr pre-cath with poss anteroseptal ischemia, EF 49% (could not r/o breast attenuation) Echo 11/2009:  EF 55-60%; mild LVH; grade 1 diast. dysfxn Cardiac cath 02/15/2010:  Normal cors; Normal LVF  Past Surgical History: Reviewed history from 12/30/2009 and no changes required. tubal ligation cataract surgery  Family History: Reviewed history from 12/30/2009 and no changes required. Family History of CAD Female 1st degree relative <60 (sis died in 85s with MI) Father with heart disease of unknown type  breast ca -sister colon ca - dad hodgkins lymphoma - daughter  Social History: Reviewed history from 12/30/2009 and no changes required. Tobacco Use - No.  Divorced  Alcohol Use - no  Review of Systems      See HPI General:  Denies chills and fever. ENT:  Complains of earache and ringing in ears; denies nasal congestion. CV:  Denies chest pain or discomfort and shortness of breath with exertion. GI:  Denies bloody stools and dark tarry stools. GU:  Denies dysuria, urinary frequency, and urinary hesitancy; long h/o urge incontinence. Derm:  Complains of lesion(s); on face; getting larger over several mos. Neuro:  Denies sensation of room spinning. Psych:  Denies depression.  Physical Exam  General:  alert, well-developed, and well-nourished.   Head:  normocephalic and atraumatic.   Eyes:  pupils equal, pupils  round, pupils reactive to light, and no optic disk abnormalities.   Ears:  R ear normal and L ear normal.   Nose:  no external deformity.   Mouth:  pharynx pink and moist and no exudates.   Neck:  supple, no thyromegaly, and no cervical lymphadenopathy.   Breasts:  skin/areolae normal, no masses, no abnormal thickening, no nipple discharge, no tenderness, and no adenopathy.   Lungs:  normal breath sounds, no crackles, and no wheezes.   Heart:  normal rate and regular rhythm.   Abdomen:  soft, non-tender, normal bowel sounds, and no hepatomegaly.   Rectal:  no external abnormalities.   Genitalia:  normal introitus and no vaginal discharge.   unable to palpate fundus or adnexae due to body habitus mod to large sized growth (?polyp) extruding from cervix; friable and bleeding; fairly good sample obtained with spatula and brush Msk:  walks  with cane Pulses:  R posterior tibial normal, R dorsalis pedis normal, L posterior tibial normal, and L dorsalis pedis normal.   Extremities:  trace left  pedal edema and trace right pedal edema.   Neurologic:  alert & oriented X3 and cranial nerves II-XII intact.   Skin:  mod sized macules on face with darkened pigment on bilat malar surface of face irreg borders Psych:  normally interactive.     Impression & Recommendations:  Problem # 1:  PREVENTIVE HEALTH CARE (ICD-V70.0)  Orders: KOH/ WET [place] [phone]) T-Pap Smear, Thin Prep (10272) T-Urinalysis (53664-40347) Mammogram (Screening) (Mammo)  Problem # 2:  ANEMIA, IRON DEFICIENCY (ICD-280.9) prob related to vaginal bleeding colo due in 2012 and stool cards neg this year  Her updated medication list for this problem includes:    Nu-iron 150 Mg Caps (Polysaccharide iron complex) .Marland Kitchen... Take 1 tablet by mouth two times a day  Orders: T-CBC w/Diff (42595-63875)  Problem # 3:  HYPERLIPIDEMIA (ICD-272.4)  Her updated medication list for this problem includes:    Pravachol 20 Mg Tabs (Pravastatin  sodium) .Marland Kitchen... Take 1 tablet by mouth once a day  Orders: T-Lipid Profile ([id number]) T-Comprehensive Metabolic Panel (41660-63016)  Problem # 4:  HYPERTENSION (ICD-401.9) controlled  Her updated medication list for this problem includes:    Furosemide 40 Mg Tabs (Furosemide) .Marland Kitchen... Take 1 tablet by mouth two times a day (pharmacy note dose increase)    Norvasc 10 Mg Tabs (Amlodipine besylate) .Marland Kitchen... Take 1 tablet by mouth every morning    Lisinopril 10 Mg Tabs (Lisinopril) .Marland Kitchen... Take 1 tab by mouth in morning. for bp and kidneys.  Orders: T-Comprehensive Metabolic Panel [phone]) T-Urinalysis (32202-54270)  Problem # 5:  DIABETES MELLITUS, TYPE II (ICD-250.00) controlled  Her updated medication list for this problem includes:    Glucophage Xr 500 Mg Tb24 (Metformin hcl) .Marland Kitchen... Take 1 tablet by mouth every morning for diabetes    Lisinopril 10 Mg Tabs (Lisinopril) .Marland Kitchen... Take 1 tab by mouth in morning. for bp and kidneys.    Adult Aspirin Low Strength 81 Mg Tbdp (Aspirin) .Marland Kitchen... Take 1 tablet by mouth once a day  Orders: Capillary Blood Glucose/CBG (62376) Capillary Blood Glucose/CBG (28315) Capillary Blood Glucose/CBG (17616) Capillary Blood Glucose/CBG (07371) Capillary Blood Glucose/CBG (06269) Capillary Blood Glucose/CBG (48546) Capillary Blood Glucose/CBG (27035) Capillary Blood Glucose/CBG (00938) Capillary Blood Glucose/CBG (82948) T- Hemoglobin A1C (18299-37169) T-Urine Microalbumin w/creat. ratio ([phone])  Problem # 6:  VAGINAL BLEEDING, ABNORMAL (ICD-626.9)  she has a mass extruding from her cervix  this is where blood is coming from I am concerned about this  refer to gyn  Orders: Gynecologic Referral (Gyn)  Problem # 7:  FACIAL RASH (ICD-782.1) ? solar lentigines refer to derm  Orders: Dermatology Referral (Derma)  Problem # 8:  EUSTACHIAN TUBE DYSFUNCTION, RIGHT (ICD-381.81) trial of nasal steroid f/u if no better  Problem # 9:   HEMATURIA UNSPECIFIED (ICD-599.70) prob related to vaginal bleeding  Orders: T-Culture, Urine (58527-78242) T- * Misc. Laboratory test [phone])  Complete Medication List: 1)  Furosemide 40 Mg Tabs (Furosemide) .... Take 1 tablet by mouth two times a day (pharmacy note dose increase) 2)  Norvasc 10 Mg Tabs (Amlodipine besylate) .... Take 1 tablet by mouth every morning 3)  Prilosec 20 Mg Cpdr (Omeprazole) .... .take 2 tables  by mouth once a day 4)  Topiramate 25 Mg Tabs (Topiramate) .Marland Kitchen.. 1 tab by mouth at bedtime 5)  Shower Bench/chair  6)  Clonazepam 1 Mg Tabs (Clonazepam) .... Take 1-2 tablets by mouth at bedtime as needed insomnia. 7)  Glucophage Xr 500 Mg Tb24 (Metformin hcl) .... Take 1 tablet by  mouth every morning for diabetes 8)  Onetouch Test Strp (Glucose blood) .... Dx 250.00 check blood sugar twice daily 9)  Lisinopril 10 Mg Tabs (Lisinopril) .... Take 1 tab by mouth in morning. for bp and kidneys. 10)  Adult Aspirin Low Strength 81 Mg Tbdp (Aspirin) .... Take 1 tablet by mouth once a day 11)  One-touch Ultra Mini Lancets  .... Use bid 12)  Pravachol 20 Mg Tabs (Pravastatin sodium) .... Take 1 tablet by mouth once a day 13)  Nitrostat 0.4 Mg Subl (Nitroglycerin) .... Use one tablet under your mouth every 5 minutes as needed for cp 14)  Indocin 25 Mg/65ml Susp (Indomethacin) .... 2 tab spo two times a day 15)  Nu-iron 150 Mg Caps (Polysaccharide iron complex) .... Take 1 tablet by mouth two times a day 16)  Onetouch Basic System W/device Kit (Blood glucose monitoring suppl) .... As directed 17)  Fluticasone Propionate 50 Mcg/act Susp (Fluticasone propionate) .... 2 sprays each nostril once daily for 4 weeks  Hypertension Assessment/Plan:      The patient's hypertensive risk group is category C: Target organ damage and/or diabetes.  Her calculated 10 year risk of coronary heart disease is 17 %.  Today's blood pressure is 130/80.  Her blood pressure goal is < 130/80.   Patient  Instructions: 1)  Please schedule a follow-up appointment in 3 months with Gerre Ranum for diabetes. 2)  Someone will call you about referrals to gynecology and dermatology.  If you do not hear anything in 2 weeks, call us. 3)  Use the nasal spray for 4 weeks.  If you ear pain does not get better or gets worse, follow up with Kleigh Hoelzer. 4)    Prescriptions: FLUTICASONE PROPIONATE 50 MCG/ACT SUSP (FLUTICASONE PROPIONATE) 2 sprays each nostril once daily for 4 weeks  #1 x 1   Entered and Authorized by:   Tereso Newcomer PA-C   Signed by:   Tereso Newcomer PA-C on 08/24/2010   Method used:   Print then Give to Patient   RxID:   [id number]   Laboratory Results   Urine Tests  Date/Time Received: August 24, 2010 9:40 AM   Routine Urinalysis   Glucose: negative   (Normal Range: Negative) Bilirubin: negative   (Normal Range: Negative) Ketone: negative   (Normal Range: Negative) Spec. Gravity: >=1.030   (Normal Range: 1.003-1.035) Blood: large   (Normal Range: Negative) pH: 5.5   (Normal Range: 5.0-8.0) Protein: 30   (Normal Range: Negative) Urobilinogen: 0.2   (Normal Range: 0-1) Nitrite: negative   (Normal Range: Negative) Leukocyte Esterace: trace   (Normal Range: Negative)     Blood Tests     HGBA1C: 6.5%   (Normal Range: Non-Diabetic - 3-6%   Control Diabetic - 6-8%) CBG Random:: 101mg /dL    [employer] [place] Source: vaginal WBC/hpf: 1-5 Bacteria/hpf: rare Clue cells/hpf: none  Negative whiff Yeast/hpf: none Wet Mount KOH: Negative Trichomonas/hpf: none

## 2011-01-09 NOTE — Progress Notes (Signed)
  Phone Note Outgoing Call   Summary of Call: needs f/u appt schedule lab visit for CBC (dx anemia) Initial call taken by: Tereso Newcomer PA-C,  July 11, 2010 9:29 PM  Follow-up for Phone Call        pt says she was scared of CPP but she will come in on Sept.... so i made her appt Follow-up by: [place] Shannon,  July 13, 2010 10:21 AM

## 2011-01-10 ENCOUNTER — Encounter: Payer: Self-pay | Admitting: *Deleted

## 2011-02-16 ENCOUNTER — Ambulatory Visit: Payer: Self-pay | Admitting: Obstetrics and Gynecology

## 2011-03-01 NOTE — Progress Notes (Signed)
NAME:  Brenda Clark, AHO NO.:  [id number]  MEDICAL RECORD NO.:  [id number]           PATIENT TYPE:  LOCATION:  [facility]                     FACILITY:  PHYSICIAN:  Eveny Anastas Christy Gentles, CNM       DATE OF BIRTH:  04/13/1951  DATE OF SERVICE:                                 CLINIC NOTE  REASON FOR TODAY'S VISIT:  Followup for ultrasound results with a repeat ultrasound to check the patient's endometrial lining thickness.  HISTORY OF PRESENT ILLNESS:  Brenda Clark is a 60 year old African American female who was referred our service from [facility] secondary to postmenopausal bleeding and abnormal Pap smear which showed ASCUS. Since then, in November 2011, she had removal of cervical polyps as well as endometrial biopsy.  The polyps were found to be benign and endometrial biopsy was also benign too.  However, at that time, she had a vaginal ultrasound which showed endometrial linings to 11 mm thickness.  She therefore had a followup ultrasound on December 10, 2010. That ultrasound showed the endometrial stripe no longer thickened and the actual measurements were 7 mm in width.  Also, the patient states she has had no more bleeding since the removal of her polyps.  She states she is doing well.  Denies any symptoms of lightheadedness or abdominal cramping.  PHYSICAL EXAMINATION:  VITAL SIGNS:  Temperature 98.2; pulse 93; blood pressure 164/92, initially rechecked showed 148/89; and weight is 371 pounds.  Height 64-1/2 inches. GENERAL:  Obese, pleasant 60 year old female, in no acute distress. CARDIOVASCULAR:  Regular rate and rhythm without murmurs. ABDOMEN:  Soft, obese, and nontender.  ASSESSMENT: 1. Endometrial thickening. 2. Postmenopausal bleeding.  PLAN: 1. The patient was started on Provera 5 mg daily x3 months back in     October 2011.  This was after her benign pathology has returned.     As stated above, she has had no further bleeding. 2. Also as stated above,  endometrial thickness has returned to 7 mm     and is back to normal.  Endometrial biopsy was negative showing no     malignancy and atrophic endometrium. 3. Plan to stop the Provera today.  Discussed with Dr. Caren Griffins and     decided not to have any followup ultrasounds.  The patient is to     report back if she has any further postmenopausal bleeding. 4. The patient does have a history of ASCUS on her last Pap which was     performed back in September.  She had a repeat pap in 6 months     which should be in March 2012.  We will schedule that today.         ______________________________ Caren Griffins, CNM   DP/MEDQ  D:  12/15/2010  T:  12/15/2010  Job:  [id number]

## 2011-03-05 LAB — POCT I-STAT 3, VENOUS BLOOD GAS (G3P V)
Bicarbonate: 21.5 mEq/L (ref 20.0–24.0)
pCO2, Ven: 38.9 mmHg — ABNORMAL LOW (ref 45.0–50.0)
pH, Ven: 7.351 — ABNORMAL HIGH (ref 7.250–7.300)
pO2, Ven: 39 mmHg (ref 30.0–45.0)

## 2011-03-05 LAB — POCT I-STAT GLUCOSE: Glucose, Bld: 104 mg/dL — ABNORMAL HIGH (ref 70–99)

## 2011-03-05 LAB — POCT I-STAT 3, ART BLOOD GAS (G3+)
O2 Saturation: 98 %
pCO2 arterial: 35.2 mmHg (ref 35.0–45.0)
pO2, Arterial: 107 mmHg — ABNORMAL HIGH (ref 80.0–100.0)

## 2011-04-27 NOTE — Procedures (Signed)
NAMELATONDRA, GEBHART                 ACCOUNT NO.:  [id number]   MEDICAL RECORD NO.:  [id number]          PATIENT TYPE:  OUT   LOCATION:  SLEEP CENTER                 FACILITY:  [facility]   PHYSICIAN:  Clinton D. Maple Hudson, MD, FCCP, FACPDATE OF BIRTH:  26-Oct-1951   DATE OF STUDY:                            NOCTURNAL POLYSOMNOGRAM   REFERRING PHYSICIAN:  Turkey R. Rankins, M.D.   INDICATIONS FOR STUDY:  Hypersomnia with sleep apnea.  Epworth  sleepiness score 8/24.  BMI 57.  Weight 342 pounds.   HOME MEDICATIONS:  Topamax, omeprazole, Lasix, hydrocodone, amlodipine,  Vytorin, nitroglycerin.   SLEEP ARCHITECTURE:  Total sleep time 314 minutes with a sleep  efficiency of 79%. Stage I was 4%, stage II 88%, stages III and IV  absent%. REM 7% of total sleep time. Sleep latency 37 minutes. REM  latency 97 minutes. Awake after sleep onset 45 minutes. Arousal index  19.3, reflecting increased sleep fragmentation.   RESPIRATORY DATA:  Apnea/hypopnea index (AHI, RDI): 18.9 obstructive  events per hour indicating moderate obstructive sleep apnea/hypopnea  syndrome. There were 25 obstructive apneas, and 73 hypopneas. Most  events and most sleep were recorded while she was on her left side.  REM  AHI 58.7 per hour.  She did not have enough early respiratory events to  permit the use of CPAP titration by split protocol.   OXYGEN DATA:  Moderate snoring with oxygen desaturation to a nadir of  82%. Mean oxygen saturation through the study was 93% on room air.   CARDIAC DATA:  Normal sinus rhythm.   MOVEMENT/PARASOMNIA:  Rare limb jerk, insignificant.   IMPRESSION/RECOMMENDATIONS:  1. Moderate obstructive sleep apnea/hypopnea syndrome, AHI 18.9 per      hour with most sleep and most events      while on her left side.  Moderate snoring with oxygen desaturation      to nadir of 82%.  2. Consider return for CPAP titration or otherwise evaluate for      alternative therapies as appropriate.      Clinton D. Maple Hudson, MD, [facility], FACP  Diplomate, Biomedical engineer of Sleep Medicine  Electronically Signed     CDY/MEDQ  D:  11/17/2006 16:06:07  T:  11/17/2006 19:47:49  Job:  [id number]   cc:   Fanny Dance. Rankins, M.D.  Fax: [phone]

## 2011-04-27 NOTE — Procedures (Signed)
NAME:  Brenda Clark, Brenda Clark                 ACCOUNT NO.:  [id number]   MEDICAL RECORD NO.:  [id number]          PATIENT TYPE:  OUT   LOCATION:  SLEEP CENTER                 [facility]ACILITY:  [facility]   PHYSICIAN:  Clinton D. Maple Hudson, MD, FCCP, FACPDATE OF BIRTH:  [birth year]1949   DATE OF STUDY:  01/08/2007                            NOCTURNAL POLYSOMNOGRAM   REFERRING PHYSICIAN:  Turkey R. Rankins, M.D.   INDICATION FOR STUDY:  Hypersomnia with sleep apnea.   EPWORTH SLEEPINESS SCORE:  8/24, BMI 57.5, weight 342 pounds.   MEDICATIONS:  List is reviewed.   The diagnostic NPSG on November 15, 2006, recorded AHI of 18.9 per hour.  CPAP titration is requested.   SLEEP ARCHITECTURE:  Total sleep time 360 minutes with sleep efficiency  83%. Stage I was 5%, stage II 81%, stages III and IV absent, REM 15% of  total sleep time.  Sleep latency was 26 minutes, REM latency 135  minutes, awake after sleep onset 49 minutes, arousal index 14.3.  Bedtime medications included Topamax, omeprazole, and Lasix.   RESPIRATORY DATA:  CPAP titration protocol.  CPAP was titrated to 10  CWP, AHI zero per hour.  A small Mirage Quattro mask was used with a  heated humidifier.   OXYGEN DATA:  CPAP prevented snoring and held oxygen saturation at 95%  on room air.   CARDIAC DATA:  Normal sinus rhythm.   MOVEMENT-PARASOMNIA:  Occasional limb jerk with insignificant effect on  sleep.   IMPRESSIONS-RECOMMENDATIONS:  1. Successful CPAP titration to 10 CWP,  AHI 4.3 per hour.  A small      Mirage Quattro mask was used with heated humidifier.  2. Baseline diagnostic NPSG on November 15, 2006, had recorded an AHI      of 18.9 per hour.     Clinton D. Maple Hudson, MD, [facility], FACP  Diplomate, Biomedical engineer of Sleep Medicine  Electronically Signed    CDY/MEDQ  D:  01/12/2007 11:13:20  T:  01/12/2007 57:84:69  Job:  [id number]

## 2011-05-12 ENCOUNTER — Emergency Department (HOSPITAL_COMMUNITY): Payer: PRIVATE HEALTH INSURANCE

## 2011-05-12 ENCOUNTER — Inpatient Hospital Stay (HOSPITAL_COMMUNITY)
Admission: EM | Admit: 2011-05-12 | Discharge: 2011-05-21 | DRG: 176 | Disposition: A | Discharge status: Skilled Nursing Facility | Payer: PRIVATE HEALTH INSURANCE | Attending: Family Medicine | Admitting: Family Medicine

## 2011-05-12 DIAGNOSIS — G43909 Migraine, unspecified, not intractable, without status migrainosus: Secondary | ICD-10-CM | POA: Diagnosis present

## 2011-05-12 DIAGNOSIS — J309 Allergic rhinitis, unspecified: Secondary | ICD-10-CM | POA: Diagnosis present

## 2011-05-12 DIAGNOSIS — I2699 Other pulmonary embolism without acute cor pulmonale: Principal | ICD-10-CM | POA: Diagnosis present

## 2011-05-12 DIAGNOSIS — F341 Dysthymic disorder: Secondary | ICD-10-CM | POA: Diagnosis present

## 2011-05-12 DIAGNOSIS — Z6841 Body Mass Index (BMI) 40.0 and over, adult: Secondary | ICD-10-CM

## 2011-05-12 DIAGNOSIS — R928 Other abnormal and inconclusive findings on diagnostic imaging of breast: Secondary | ICD-10-CM | POA: Diagnosis present

## 2011-05-12 DIAGNOSIS — I10 Essential (primary) hypertension: Secondary | ICD-10-CM | POA: Diagnosis present

## 2011-05-12 DIAGNOSIS — Z7982 Long term (current) use of aspirin: Secondary | ICD-10-CM

## 2011-05-12 DIAGNOSIS — D509 Iron deficiency anemia, unspecified: Secondary | ICD-10-CM | POA: Diagnosis present

## 2011-05-12 DIAGNOSIS — E785 Hyperlipidemia, unspecified: Secondary | ICD-10-CM | POA: Diagnosis present

## 2011-05-12 DIAGNOSIS — G4700 Insomnia, unspecified: Secondary | ICD-10-CM | POA: Diagnosis present

## 2011-05-12 DIAGNOSIS — Z79899 Other long term (current) drug therapy: Secondary | ICD-10-CM

## 2011-05-12 DIAGNOSIS — G4733 Obstructive sleep apnea (adult) (pediatric): Secondary | ICD-10-CM | POA: Diagnosis present

## 2011-05-12 DIAGNOSIS — E119 Type 2 diabetes mellitus without complications: Secondary | ICD-10-CM | POA: Diagnosis present

## 2011-05-12 DIAGNOSIS — K219 Gastro-esophageal reflux disease without esophagitis: Secondary | ICD-10-CM | POA: Diagnosis present

## 2011-05-12 DIAGNOSIS — E662 Morbid (severe) obesity with alveolar hypoventilation: Secondary | ICD-10-CM | POA: Diagnosis present

## 2011-05-12 LAB — URINE MICROSCOPIC-ADD ON

## 2011-05-12 LAB — HEPARIN LEVEL (UNFRACTIONATED): Heparin Unfractionated: 0.59 IU/mL (ref 0.30–0.70)

## 2011-05-12 LAB — CK TOTAL AND CKMB (NOT AT ARMC)
CK, MB: 4.6 ng/mL — ABNORMAL HIGH (ref 0.3–4.0)
Relative Index: INVALID (ref 0.0–2.5)
Total CK: 63 U/L (ref 7–177)

## 2011-05-12 LAB — PHOSPHORUS: Phosphorus: 2.8 mg/dL (ref 2.3–4.6)

## 2011-05-12 LAB — POCT I-STAT, CHEM 8
BUN: 13 mg/dL (ref 6–23)
Calcium, Ion: 1.04 mmol/L — ABNORMAL LOW (ref 1.12–1.32)
Chloride: 109 mEq/L (ref 96–112)
Creatinine, Ser: 0.9 mg/dL (ref 0.4–1.2)
Glucose, Bld: 139 mg/dL — ABNORMAL HIGH (ref 70–99)
HCT: 33 % — ABNORMAL LOW (ref 36.0–46.0)
Hemoglobin: 11.2 g/dL — ABNORMAL LOW (ref 12.0–15.0)
Potassium: 4.4 meq/L (ref 3.5–5.1)
Sodium: 141 meq/L (ref 135–145)
TCO2: 22 mmol/L (ref 0–100)

## 2011-05-12 LAB — COMPREHENSIVE METABOLIC PANEL
AST: 15 U/L (ref 0–37)
BUN: 12 mg/dL (ref 6–23)
CO2: 27 mEq/L (ref 19–32)
Calcium: 9.1 mg/dL (ref 8.4–10.5)
Creatinine, Ser: 0.53 mg/dL (ref 0.4–1.2)
GFR calc Af Amer: >60 mL/min (ref >60)
GFR calc non Af Amer: >60 mL/min (ref >60)
Total Bilirubin: 0.1 mg/dL — ABNORMAL LOW (ref 0.3–1.2)

## 2011-05-12 LAB — APTT: aPTT: 24 s (ref 24–37)

## 2011-05-12 LAB — URINALYSIS, ROUTINE W REFLEX MICROSCOPIC
Glucose, UA: NEGATIVE mg/dL
Ketones, ur: 15 mg/dL — AB
Leukocytes, UA: NEGATIVE
Nitrite: NEGATIVE
Protein, ur: 100 mg/dL — AB
Specific Gravity, Urine: 1.034 — ABNORMAL HIGH (ref 1.005–1.030)
Urobilinogen, UA: 1 mg/dL (ref 0.0–1.0)
pH: 6 (ref 5.0–8.0)

## 2011-05-12 LAB — CBC
HCT: 31.7 % — ABNORMAL LOW (ref 36.0–46.0)
Hemoglobin: 9.3 g/dL — ABNORMAL LOW (ref 12.0–15.0)
MCH: 21.3 pg — ABNORMAL LOW (ref 26.0–34.0)
MCHC: 29.3 g/dL — ABNORMAL LOW (ref 30.0–36.0)
MCV: 72.5 fL — ABNORMAL LOW (ref 78.0–100.0)
Platelets: 345 10*3/uL (ref 150–400)
RBC: 4.37 MIL/uL (ref 3.87–5.11)
RDW: 17.5 % — ABNORMAL HIGH (ref 11.5–15.5)
WBC: 8.3 K/uL (ref 4.0–10.5)

## 2011-05-12 LAB — COMPREHENSIVE METABOLIC PANEL WITH GFR
ALT: 10 U/L (ref 0–35)
Albumin: 3.4 g/dL — ABNORMAL LOW (ref 3.5–5.2)
Alkaline Phosphatase: 99 U/L (ref 39–117)
Chloride: 104 meq/L (ref 96–112)
Glucose, Bld: 136 mg/dL — ABNORMAL HIGH (ref 70–99)
Potassium: 4.1 meq/L (ref 3.5–5.1)
Sodium: 142 meq/L (ref 135–145)
Total Protein: 7.4 g/dL (ref 6.0–8.3)

## 2011-05-12 LAB — POCT I-STAT 3, ART BLOOD GAS (G3+)
Acid-base deficit: 2 mmol/L (ref 0.0–2.0)
Bicarbonate: 20.8 mEq/L (ref 20.0–24.0)
O2 Saturation: 91 %
TCO2: 22 mmol/L (ref 0–100)
pCO2 arterial: 27.3 mmHg — ABNORMAL LOW (ref 35.0–45.0)
pH, Arterial: 7.49 — ABNORMAL HIGH (ref 7.350–7.400)
pO2, Arterial: 54 mmHg — ABNORMAL LOW (ref 80.0–100.0)

## 2011-05-12 LAB — PRO B NATRIURETIC PEPTIDE: Pro B Natriuretic peptide (BNP): 879 pg/mL — ABNORMAL HIGH (ref 0–125)

## 2011-05-12 LAB — MRSA PCR SCREENING: MRSA by PCR: NEGATIVE

## 2011-05-12 LAB — DIFFERENTIAL
Basophils Absolute: 0 K/uL (ref 0.0–0.1)
Basophils Relative: 0 % (ref 0–1)
Eosinophils Absolute: 0 10*3/uL (ref 0.0–0.7)
Eosinophils Relative: 0 % (ref 0–5)
Lymphocytes Relative: 12 % (ref 12–46)
Lymphs Abs: 1 10*3/uL (ref 0.7–4.0)
Monocytes Absolute: 0.3 10*3/uL (ref 0.1–1.0)
Monocytes Relative: 3 % (ref 3–12)
Neutro Abs: 7 10*3/uL (ref 1.7–7.7)
Neutrophils Relative %: 84 % — ABNORMAL HIGH (ref 43–77)

## 2011-05-12 LAB — TROPONIN I: Troponin I: 0.52 ng/mL (ref <0.30)

## 2011-05-12 LAB — PROTIME-INR
INR: 1.01 (ref 0.00–1.49)
Prothrombin Time: 13.5 seconds (ref 11.6–15.2)

## 2011-05-12 LAB — MAGNESIUM: Magnesium: 2 mg/dL (ref 1.5–2.5)

## 2011-05-12 MED ORDER — XENON XE 133 GAS
10.0000 | GAS_FOR_INHALATION | Freq: Once | RESPIRATORY_TRACT | Status: AC | PRN
  Administered 2011-05-12: 10 via RESPIRATORY_TRACT

## 2011-05-12 MED ORDER — TECHNETIUM TO 99M ALBUMIN AGGREGATED
6.0000 | Freq: Once | INTRAVENOUS | Status: AC | PRN
  Administered 2011-05-12: 6 via INTRAVENOUS

## 2011-05-13 DIAGNOSIS — I2699 Other pulmonary embolism without acute cor pulmonale: Secondary | ICD-10-CM

## 2011-05-13 LAB — CBC
HCT: 28.8 % — ABNORMAL LOW (ref 36.0–46.0)
Hemoglobin: 8.4 g/dL — ABNORMAL LOW (ref 12.0–15.0)
MCH: 21 pg — ABNORMAL LOW (ref 26.0–34.0)
MCHC: 29.2 g/dL — ABNORMAL LOW (ref 30.0–36.0)
MCV: 72 fL — ABNORMAL LOW (ref 78.0–100.0)
Platelets: 342 K/uL (ref 150–400)
RBC: 4 MIL/uL (ref 3.87–5.11)
RDW: 17.7 % — ABNORMAL HIGH (ref 11.5–15.5)
WBC: 6.8 10*3/uL (ref 4.0–10.5)

## 2011-05-13 LAB — GLUCOSE, CAPILLARY
Glucose-Capillary: 151 mg/dL — ABNORMAL HIGH (ref 70–99)
Glucose-Capillary: 128 mg/dL — ABNORMAL HIGH (ref 70–99)
Glucose-Capillary: 145 mg/dL — ABNORMAL HIGH (ref 70–99)

## 2011-05-13 LAB — PROTIME-INR
INR: 1.1 (ref 0.00–1.49)
Prothrombin Time: 14.4 s (ref 11.6–15.2)

## 2011-05-13 LAB — LIPID PANEL
Cholesterol: 173 mg/dL (ref 0–200)
HDL: 54 mg/dL (ref >39)
LDL Cholesterol: 100 mg/dL — ABNORMAL HIGH (ref 0–99)
Total CHOL/HDL Ratio: 3.2 ratio
Triglycerides: 96 mg/dL (ref <150)
VLDL: 19 mg/dL (ref 0–40)

## 2011-05-13 LAB — BASIC METABOLIC PANEL
BUN: 17 mg/dL (ref 6–23)
CO2: 23 mEq/L (ref 19–32)
Glucose, Bld: 145 mg/dL — ABNORMAL HIGH (ref 70–99)
Potassium: 3.9 mEq/L (ref 3.5–5.1)
Sodium: 140 mEq/L (ref 135–145)

## 2011-05-13 LAB — HEPARIN LEVEL (UNFRACTIONATED)
Heparin Unfractionated: 0.73 IU/mL — ABNORMAL HIGH (ref 0.30–0.70)
Heparin Unfractionated: 0.53 IU/mL (ref 0.30–0.70)

## 2011-05-13 LAB — IRON AND TIBC
Iron: 11 ug/dL — ABNORMAL LOW (ref 42–135)
TIBC: 413 ug/dL (ref 250–470)
UIBC: 402 ug/dL

## 2011-05-13 LAB — CARDIAC PANEL(CRET KIN+CKTOT+MB+TROPI)
CK, MB: 3.8 ng/mL (ref 0.3–4.0)
CK, MB: 3.5 ng/mL (ref 0.3–4.0)
Relative Index: INVALID (ref 0.0–2.5)
Relative Index: INVALID (ref 0.0–2.5)
Total CK: 54 U/L (ref 7–177)
Total CK: 53 U/L (ref 7–177)
Troponin I: 0.53 ng/mL (ref <0.30)
Troponin I: 0.41 ng/mL (ref <0.30)

## 2011-05-13 LAB — BASIC METABOLIC PANEL WITH GFR
Calcium: 9 mg/dL (ref 8.4–10.5)
Chloride: 107 meq/L (ref 96–112)
Creatinine, Ser: 0.71 mg/dL (ref 0.4–1.2)
GFR calc Af Amer: >60 mL/min (ref >60)
GFR calc non Af Amer: >60 mL/min (ref >60)

## 2011-05-13 LAB — HEMOGLOBIN A1C: Hgb A1c MFr Bld: 6.5 % — ABNORMAL HIGH (ref <5.7)

## 2011-05-13 LAB — TSH: TSH: 0.583 u[IU]/mL (ref 0.350–4.500)

## 2011-05-14 LAB — BASIC METABOLIC PANEL WITH GFR
CO2: 24 meq/L (ref 19–32)
Chloride: 105 meq/L (ref 96–112)
GFR calc Af Amer: >60 mL/min (ref >60)
Potassium: 3.9 meq/L (ref 3.5–5.1)

## 2011-05-14 LAB — CBC
HCT: 28.3 % — ABNORMAL LOW (ref 36.0–46.0)
Hemoglobin: 8.1 g/dL — ABNORMAL LOW (ref 12.0–15.0)
MCH: 20.7 pg — ABNORMAL LOW (ref 26.0–34.0)
MCHC: 28.6 g/dL — ABNORMAL LOW (ref 30.0–36.0)
MCV: 72.2 fL — ABNORMAL LOW (ref 78.0–100.0)
Platelets: 341 10*3/uL (ref 150–400)
RBC: 3.92 MIL/uL (ref 3.87–5.11)
RDW: 17.8 % — ABNORMAL HIGH (ref 11.5–15.5)
WBC: 7.7 K/uL (ref 4.0–10.5)

## 2011-05-14 LAB — BASIC METABOLIC PANEL
BUN: 21 mg/dL (ref 6–23)
Calcium: 8.9 mg/dL (ref 8.4–10.5)
Creatinine, Ser: 0.69 mg/dL (ref 0.4–1.2)
GFR calc non Af Amer: >60 mL/min (ref >60)
Glucose, Bld: 139 mg/dL — ABNORMAL HIGH (ref 70–99)
Sodium: 139 mEq/L (ref 135–145)

## 2011-05-14 LAB — GLUCOSE, CAPILLARY

## 2011-05-14 LAB — HEPARIN LEVEL (UNFRACTIONATED): Heparin Unfractionated: 0.58 IU/mL (ref 0.30–0.70)

## 2011-05-14 LAB — PROTIME-INR
INR: 1.14 (ref 0.00–1.49)
Prothrombin Time: 14.8 seconds (ref 11.6–15.2)

## 2011-05-15 LAB — HEPARIN LEVEL (UNFRACTIONATED): Heparin Unfractionated: 0.34 [IU]/mL (ref 0.30–0.70)

## 2011-05-15 LAB — CBC
HCT: 28.2 % — ABNORMAL LOW (ref 36.0–46.0)
Hemoglobin: 8.1 g/dL — ABNORMAL LOW (ref 12.0–15.0)
MCH: 20.9 pg — ABNORMAL LOW (ref 26.0–34.0)
MCHC: 28.7 g/dL — ABNORMAL LOW (ref 30.0–36.0)
MCV: 72.7 fL — ABNORMAL LOW (ref 78.0–100.0)
Platelets: 297 K/uL (ref 150–400)
RBC: 3.88 MIL/uL (ref 3.87–5.11)
RDW: 17.6 % — ABNORMAL HIGH (ref 11.5–15.5)
WBC: 6.1 10*3/uL (ref 4.0–10.5)

## 2011-05-15 LAB — GLUCOSE, CAPILLARY: Glucose-Capillary: 161 mg/dL — ABNORMAL HIGH (ref 70–99)

## 2011-05-15 LAB — PROTIME-INR
INR: 1.41 (ref 0.00–1.49)
Prothrombin Time: 17.5 seconds — ABNORMAL HIGH (ref 11.6–15.2)

## 2011-05-16 LAB — GLUCOSE, CAPILLARY
Glucose-Capillary: 124 mg/dL — ABNORMAL HIGH (ref 70–99)
Glucose-Capillary: 104 mg/dL — ABNORMAL HIGH (ref 70–99)
Glucose-Capillary: 130 mg/dL — ABNORMAL HIGH (ref 70–99)
Glucose-Capillary: 106 mg/dL — ABNORMAL HIGH (ref 70–99)
Glucose-Capillary: 110 mg/dL — ABNORMAL HIGH (ref 70–99)
Glucose-Capillary: 108 mg/dL — ABNORMAL HIGH (ref 70–99)

## 2011-05-16 LAB — CBC
HCT: 28.9 % — ABNORMAL LOW (ref 36.0–46.0)
Hemoglobin: 8.2 g/dL — ABNORMAL LOW (ref 12.0–15.0)
MCH: 20.7 pg — ABNORMAL LOW (ref 26.0–34.0)
MCHC: 28.4 g/dL — ABNORMAL LOW (ref 30.0–36.0)
MCV: 72.8 fL — ABNORMAL LOW (ref 78.0–100.0)
Platelets: 287 K/uL (ref 150–400)
RBC: 3.97 MIL/uL (ref 3.87–5.11)
RDW: 17.4 % — ABNORMAL HIGH (ref 11.5–15.5)
WBC: 5.7 10*3/uL (ref 4.0–10.5)

## 2011-05-16 LAB — PROTIME-INR
INR: 1.68 — ABNORMAL HIGH (ref 0.00–1.49)
Prothrombin Time: 20 s — ABNORMAL HIGH (ref 11.6–15.2)

## 2011-05-16 LAB — HEPARIN LEVEL (UNFRACTIONATED): Heparin Unfractionated: 0.18 IU/mL — ABNORMAL LOW (ref 0.30–0.70)

## 2011-05-16 LAB — PREPARE RBC (CROSSMATCH)

## 2011-05-17 LAB — COMPREHENSIVE METABOLIC PANEL WITH GFR
ALT: 17 U/L (ref 0–35)
Alkaline Phosphatase: 113 U/L (ref 39–117)
BUN: 11 mg/dL (ref 6–23)
CO2: 24 meq/L (ref 19–32)
GFR calc non Af Amer: >60 mL/min (ref >60)
Glucose, Bld: 127 mg/dL — ABNORMAL HIGH (ref 70–99)
Potassium: 4.1 meq/L (ref 3.5–5.1)
Sodium: 137 meq/L (ref 135–145)
Total Bilirubin: 0.2 mg/dL — ABNORMAL LOW (ref 0.3–1.2)
Total Protein: 6.8 g/dL (ref 6.0–8.3)

## 2011-05-17 LAB — CBC
HCT: 30.8 % — ABNORMAL LOW (ref 36.0–46.0)
Hemoglobin: 9.1 g/dL — ABNORMAL LOW (ref 12.0–15.0)
MCH: 21.5 pg — ABNORMAL LOW (ref 26.0–34.0)
MCHC: 29.5 g/dL — ABNORMAL LOW (ref 30.0–36.0)
MCV: 72.6 fL — ABNORMAL LOW (ref 78.0–100.0)
Platelets: 299 10*3/uL (ref 150–400)
RBC: 4.24 MIL/uL (ref 3.87–5.11)
RDW: 17.4 % — ABNORMAL HIGH (ref 11.5–15.5)
WBC: 5.9 K/uL (ref 4.0–10.5)

## 2011-05-17 LAB — COMPREHENSIVE METABOLIC PANEL
AST: 15 U/L (ref 0–37)
Albumin: 2.9 g/dL — ABNORMAL LOW (ref 3.5–5.2)
Calcium: 9 mg/dL (ref 8.4–10.5)
Chloride: 103 mEq/L (ref 96–112)
Creatinine, Ser: 0.57 mg/dL (ref 0.4–1.2)
GFR calc Af Amer: >60 mL/min (ref >60)

## 2011-05-17 LAB — GLUCOSE, CAPILLARY: Glucose-Capillary: 126 mg/dL — ABNORMAL HIGH (ref 70–99)

## 2011-05-17 LAB — HEPARIN LEVEL (UNFRACTIONATED): Heparin Unfractionated: 0.33 [IU]/mL (ref 0.30–0.70)

## 2011-05-17 LAB — CA 125: CA 125: 12.7 U/mL (ref 0.0–30.2)

## 2011-05-17 LAB — PROTIME-INR
INR: 1.8 — ABNORMAL HIGH (ref 0.00–1.49)
Prothrombin Time: 21.1 s — ABNORMAL HIGH (ref 11.6–15.2)

## 2011-05-17 LAB — CEA: CEA: 0.9 ng/mL (ref 0.0–5.0)

## 2011-05-17 LAB — CANCER ANTIGEN 19-9: CA 19-9: 7.9 U/mL — ABNORMAL LOW (ref <35.0)

## 2011-05-17 LAB — TYPE AND SCREEN: ABO/RH(D): AB POS

## 2011-05-18 LAB — CBC
HCT: 28.3 % — ABNORMAL LOW (ref 36.0–46.0)
Hemoglobin: 8.4 g/dL — ABNORMAL LOW (ref 12.0–15.0)
MCH: 21.6 pg — ABNORMAL LOW (ref 26.0–34.0)
MCHC: 29.7 g/dL — ABNORMAL LOW (ref 30.0–36.0)
MCV: 72.8 fL — ABNORMAL LOW (ref 78.0–100.0)
Platelets: 276 K/uL (ref 150–400)
RBC: 3.89 MIL/uL (ref 3.87–5.11)
RDW: 17.7 % — ABNORMAL HIGH (ref 11.5–15.5)
WBC: 6.4 K/uL (ref 4.0–10.5)

## 2011-05-18 LAB — LUPUS ANTICOAGULANT PANEL
DRVVT: 55.1 s — ABNORMAL HIGH (ref 36.2–44.3)
Lupus Anticoagulant: NOT DETECTED
PTT Lupus Anticoagulant: 91 s — ABNORMAL HIGH (ref 30.0–45.6)
PTTLA 4:1 Mix: 56.7 secs — ABNORMAL HIGH (ref 30.0–45.6)
PTTLA Confirmation: 0.7 s (ref <8.0)
dRVVT Incubated 1:1 Mix: 38.4 s (ref 36.2–44.3)

## 2011-05-18 LAB — GLUCOSE, CAPILLARY
Glucose-Capillary: 133 mg/dL — ABNORMAL HIGH (ref 70–99)
Glucose-Capillary: 103 mg/dL — ABNORMAL HIGH (ref 70–99)
Glucose-Capillary: 127 mg/dL — ABNORMAL HIGH (ref 70–99)

## 2011-05-18 LAB — BASIC METABOLIC PANEL
BUN: 10 mg/dL (ref 6–23)
Chloride: 106 mEq/L (ref 96–112)
Potassium: 4.1 mEq/L (ref 3.5–5.1)
Sodium: 137 mEq/L (ref 135–145)

## 2011-05-18 LAB — PROTIME-INR
INR: 2.31 — ABNORMAL HIGH (ref 0.00–1.49)
Prothrombin Time: 25.5 seconds — ABNORMAL HIGH (ref 11.6–15.2)

## 2011-05-18 LAB — BASIC METABOLIC PANEL WITH GFR
CO2: 22 meq/L (ref 19–32)
Calcium: 8.7 mg/dL (ref 8.4–10.5)
Creatinine, Ser: 0.53 mg/dL (ref 0.4–1.2)
GFR calc Af Amer: >60 mL/min (ref >60)
GFR calc non Af Amer: >60 mL/min (ref >60)
Glucose, Bld: 108 mg/dL — ABNORMAL HIGH (ref 70–99)

## 2011-05-18 LAB — ANA: Anti Nuclear Antibody(ANA): NEGATIVE

## 2011-05-18 LAB — HEPARIN LEVEL (UNFRACTIONATED): Heparin Unfractionated: 0.45 IU/mL (ref 0.30–0.70)

## 2011-05-18 LAB — POCT OCCULT BLOOD STOOL (DEVICE): Fecal Occult Bld: NEGATIVE

## 2011-05-19 LAB — GLUCOSE, CAPILLARY
Glucose-Capillary: 141 mg/dL — ABNORMAL HIGH (ref 70–99)
Glucose-Capillary: 95 mg/dL (ref 70–99)
Glucose-Capillary: 117 mg/dL — ABNORMAL HIGH (ref 70–99)

## 2011-05-19 LAB — CBC
MCH: 21.4 pg — ABNORMAL LOW (ref 26.0–34.0)
MCHC: 29.5 g/dL — ABNORMAL LOW (ref 30.0–36.0)
RDW: 17.7 % — ABNORMAL HIGH (ref 11.5–15.5)

## 2011-05-19 LAB — PROTIME-INR: Prothrombin Time: 31 seconds — ABNORMAL HIGH (ref 11.6–15.2)

## 2011-05-19 LAB — HEPARIN LEVEL (UNFRACTIONATED): Heparin Unfractionated: 0.58 IU/mL (ref 0.30–0.70)

## 2011-05-20 LAB — GLUCOSE, CAPILLARY
Glucose-Capillary: 118 mg/dL — ABNORMAL HIGH (ref 70–99)
Glucose-Capillary: 98 mg/dL (ref 70–99)

## 2011-05-21 LAB — GLUCOSE, CAPILLARY
Glucose-Capillary: 111 mg/dL — ABNORMAL HIGH (ref 70–99)
Glucose-Capillary: 92 mg/dL (ref 70–99)

## 2011-05-21 LAB — PROTIME-INR: INR: 3.52 — ABNORMAL HIGH (ref 0.00–1.49)

## 2011-05-22 NOTE — Group Therapy Note (Signed)
NAMEABIGAEL, Clark                 ACCOUNT NO.:  [id number]  MEDICAL RECORD NO.:  [id number]  LOCATION:  2918                         FACILITY:  [facility]  PHYSICIAN:  Conley Canal, MD      DATE OF BIRTH:  03/06/1951                                PROGRESS NOTE   PROBLEM LIST: 1. Presumed acute pulmonary embolism diagnosed by V/Q scan showing     high probability for PE. 2. Microcytic anemia, iron deficiency type, source of blood loss     unclear. 3. Diabetes mellitus type 2 controlled. 4. Morbid obesity. 5. Abnormal mammogram in October 2011, needs follow-up once the     patient is clinically stable pulmonary wise.  PROCEDURES PERFORMED: 1. V/Q scan on May 12, 2011 showed high probability for pulmonary     embolism. 2. Chest x-ray on May 12, 2011 showed no active lung disease, but some     stable mild cardiomegaly. 3. 2-D echocardiogram on May 14, 2011 showed EF 55% to 60%.  HOSPITAL COURSE: Brenda Clark is a pleasant 60 year old female with comorbidities as noted above who also has a strong family history of cancer including sister with breast cancer who came in with chest pain and shortness of breath and had a V/Q scan showing high probability for PE.  She apparently did not have a CT of the chest with contrast because of weight limit.  The patient has done reasonably well on heparin and Coumadin.  However, the concern at this point is that although she has a sedentary lifestyle, and she is obese.  She may have some underlying malignancy precipitating the PE.  She also has microcytic anemia secondary to iron deficiency, but the sole cause of the anemia is not clear.  She says that she had colonoscopy done in the last year and she says it was reportedly normal. 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  These issues have to be addressed once the patient is clinically stable cardiopulmonary wise.  Otherwise, she has done reasonably well.  She is less dyspneic and complaints of feeling generally weak and being constipated.  PHYSICAL EXAMINATION: GENERAL:  On exam, the patient is sitting up, not in distress. VITAL SIGNS:  Blood pressure 142/86, heart rate is 99; temperature 97.7, respirations 18 to 22, oxygen saturation is 100% on 2 liters nasal cannula. HEAD, EARS, NOSE AND THROAT:  The patient is pale.  Pupils are equal, reacting to light. RESPIRATORY SYSTEM:  Good air entry bilaterally with no rhonchi, rales or wheezes. CARDIOVASCULAR SYSTEM:  First and second heart sounds heard.  No murmurs.  Pulse regular. ABDOMEN:  Obese, otherwise soft, nontender.  No palpable organomegaly. CNS:  Grossly intact. EXTREMITIES:  Left leg slightly more swollen compared to the right.  LABORATORY DATA: Labs were reviewed significant for WBC 5.7, hemoglobin 8.2, hematocrit 28.9, platelet count 287, PT 20, INR 1.68.  ASSESSMENT AND PLAN: 1. Acute pulmonary embolism.  Query calls concern  for some possible     occult malignancy.  We will continue Coumadin and heparin for     target INR 2 to 3, gradual drop in hemoglobin and hematocrit     consenting, also sent to Hemoccult.  We will send screening tests     including CEA, CA 125, CA 99, lupus anticoagulant and the patient     will need follow up mammogram for the abnormal mammogram in October     2011.  She may need a bone marrow biopsy eventually once she has     completed the 6 months of Coumadin.  I discussed this concerns with     the patient who expressed understanding. 2. Microcytic iron deficiency anemia, not sure of the cause of the     anemia.  She apparently had colonoscopy in the last year also.  We     will check stool Hemoccult.  We will continue to monitor      hemoglobin.  She may benefit from PRBC transfusion given the acute     pulmonary embolism.  In fact, we will request transfusion of 1 unit     of PRBC. 3. Diabetes mellitus type 2, seems controlled.  Continue home     medications. 4. Morbid obesity. 5. Obstructive sleep apnea, on CPAP.  Continue same. 6. DVT prophylaxis. 7. The patient's condition closely guarded, but stable for transfer to     telemetry.     Conley Canal, MD     SR/MEDQ  D:  05/16/2011  T:  05/16/2011  Job:  [id number]  Electronically Signed by Conley Canal  on 05/22/2011 07:39:20 PM

## 2011-05-24 NOTE — Discharge Summary (Signed)
  NAMEJAYLANIE, Brenda Clark NO.:  [id number]  MEDICAL RECORD NO.:  [id number]  LOCATION:  2024                         FACILITY:  [facility]  PHYSICIAN:  Standley Dakins, MD   DATE OF BIRTH:  [birth year]  DATE OF ADMISSION:  05/12/2011 DATE OF DISCHARGE:                        DISCHARGE SUMMARY - REFERRING   ADDENDUM  DISCHARGE MEDICATIONS: 1. Lantus insulin 5 units subcu at bedtime. 2. MiraLax 17 g p.o. daily. 3. Warfarin 5 mg take 2 tablets every evening Monday through Friday     and take 1.5 tablets on Saturday and Sunday. 4. Aspirin 81 mg 1 p.o. daily. 5. Clonazepam 1-2 tablets by mouth daily as needed. 6. Fluticasone 50 mcg 2 sprays per nostril daily. 7. Furosemide 40 mg 1 p.o. twice daily. 8. Glucophage XR 500 mg 1 tablet by mouth every morning. 9. Lisinopril 10 mg 1 p.o. daily. 10.Nitroglycerin sublingual 0.4 mg 1 tablet under the tongue every 5     minutes p.r.n. 11.Norvasc 10 mg 1 tablet by mouth every morning. 12.Nu-Iron 150 mg 1 capsule twice daily. 13.Pravastatin 40 mg 1 p.o. at bedtime. 14.Prilosec 20 mg 2 capsules by mouth every morning. 15.Topamax 25 mg 1 tablet by mouth daily at bedtime.  The patient was given instructions to please follow up with her primary care provider at the [facility] for ongoing cancer workup and also for her daily PT/INR test for the next week and then for her Coumadin to be managed by her primary care provider.  The patient verbalized understanding.  The patient has said that she had consider changing primary care providers and I recommended that she continue with her current primary care provider until she has established care with a new provider.  The patient verbalized understanding.  I also expressed her the importance of having close monitoring of her PT and INR while she is on Coumadin.  The patient verbalized understanding.  I recommended that the patient continue to monitor her blood  glucose closely testing at least 3 times per day and also continue CPAP.  The patient verbalized understanding.  I explained to the patient that her warfarin doses might change as her primary care provider or the physician at the rehab facility, they may make some changes to the dosages based on the results of her PT/INR.  SPECIAL INSTRUCTIONS:  The patient will need to have her PT retested tomorrow and daily for the next week and Coumadin adjusted accordingly.  Please see the hospital records and labs for further details of this hospital admission.     Standley Dakins, MD     CJ/MEDQ  D:  05/21/2011  T:  05/21/2011  Job:  [id number]  cc:   [facility]  Electronically Signed by Standley Dakins  on 05/24/2011 09:44:13 AM

## 2011-05-24 NOTE — Discharge Summary (Signed)
  Brenda Clark, Brenda Clark NO.:  [id number]  MEDICAL RECORD NO.:  [id number]  LOCATION:  2024                         FACILITY:  [facility]  PHYSICIAN:  Standley Dakins, MD   DATE OF BIRTH:  Oct 21, 1951  DATE OF ADMISSION:  05/12/2011 DATE OF DISCHARGE:  05/21/2011                              DISCHARGE SUMMARY   DISCHARGE DIAGNOSES: 1. Acute pulmonary embolism. 2. Hypertension. 3. Type 2 diabetes mellitus. 4. Hyperlipidemia. 5. Allergic rhinitis. 6. Iron deficiency anemia. 7. Obstructive sleep apnea. 8. Gastroesophageal reflux disease. 9. History of migraine headaches. 10.Morbid obesity.  DISCHARGE MEDICATIONS:  Please see discharge medication reconciliation form.  HOSPITAL COURSE:  Briefly, this patient is a 60 year old female with multiple comorbidities as mentioned above with a strong family history of cancer who presented to the emergency department with chest pain and shortness of breath.  She had a V/Q scan that was showing a high probability for pulmonary embolus.  She could not have a CT of the chest because of her weight.  The patient was treated with heparin and started on oral Coumadin therapy.  She remained on heparin until her Coumadin was therapeutic and she was also evaluated for underlying malignancies that could possibly precipitate pulmonary embolus.  Those tests have been inconclusive at this time but she was given instructions to follow up with her primary care provider for more evaluation.  She also has a history of an abnormal mammogram that needs a 73-month followup, which is due at this time.  She has been given instructions to follow up with her primary care provider to have her mammogram repeated.  The patient became deconditioned during this hospitalization and physical therapy recommended skilled nursing care.  She is going to be transferred to a skilled nursing facility for rehabilitation.  Clinically, the patient did very well and  improved with treatment and had stable cardiopulmonary status throughout the hospitalization.  Please see the entire records of the hospitalization for further details of this admission.  DISCHARGE CONDITION:  Stable at baseline.  DISPOSITION:  The patient will be transferred to the skilled nursing rehabilitation center for further rehabilitation and treatment.  ACTIVITY:  As tolerated per physical therapy recommendations.  DIET:  Vitamin K consistent diabetic and cardiac diet recommended.  FOLLOWUP:  I  recommended that the patient follow up with her primary care provider to evaluate following up for the mammogram that is due at this time with her history of an abnormal mammogram and also for following up on PT, INR checks that will be required for her to remain on warfarin.  SPECIAL INSTRUCTIONS:  Return if symptoms recur, worsen or new changes develop.  The patient verbalized understanding and also continued to have the PT and INR tested regularly while on Coumadin.  Continue night time CPAP as ordered and continue to follow up with primary care provider as recommended.     Standley Dakins, MD     CJ/MEDQ  D:  05/20/2011  T:  05/20/2011  Job:  [id number]  Electronically Signed by Standley Dakins  on 05/24/2011 09:43:34 AM

## 2011-05-31 NOTE — H&P (Signed)
Brenda Clark, Brenda Clark NO.:  [id number]  MEDICAL RECORD NO.:  [id number]           PATIENT TYPE:  E  LOCATION:  [facility]                         FACILITY:  [facility]  PHYSICIAN:  Rosanna Randy, MDDATE OF BIRTH:  Feb 13, 1951  DATE OF ADMISSION:  05/12/2011 DATE OF DISCHARGE:                             HISTORY & PHYSICAL   CHIEF COMPLAINT:  Chest pain, shortness of breath.  HISTORY OF PRESENT ILLNESS:  Brenda Clark is a 60 year old female who present with a chief complaint of chest pressure and shortness of breath.  The patient reports that the symptoms started yesterday and they has just continue worsening throughout this time until the moment of admission.  The patient has some chronic shortness of breath, worse with exertion, but now she cannot even walk from the bedroom to the bathroom across her hall, which is only few steps away.  She has tightness in the lower mid sternum that is nonradiated.  There had not been any sweating associated.  No nausea, vomiting or abdominal pain. The patient has no history of coronary artery disease and she takes medications for diabetes and hypertension.  The patient denies any fever, but reports some chills.  There is no cough or any cold symptoms at this moment.  After discussing with the patient, she have really sedentary lifestyle and she is pretty obese.  The patient reports to be compliant with her medications, but reports that she is not compliant with her CPAP machine.  ALLERGIES:  There are no known drug allergies.  PAST MEDICAL HISTORY: 1. Significant for diabetes. 2. Hypertension. 3. Hyperlipidemia. 4. Allergic rhinitis. 5. Iron deficiency anemia. 6. Insomnia. 7. Obstructive sleep apnea. 8. Gastroesophageal reflux disease. 9. History of migraines.  MEDICATIONS: 1. Fluticasone 50 mcg nasal spray 2 sprays daily. 2. Nu-Iron 150 mg 1 capsule twice a day. 3. Nitroglycerin sublingual 0.4 mg 1 tablet every 5  minutes as needed     up to three doses for chest pain. 4. Pravastatin 40 mg 1 tablet by mouth daily. 5. Aspirin 81 mg 1 tablet by mouth daily. 6. Lisinopril 10 mg 1 tablet by mouth daily. 7. Glucophage extended release 500 mg 1 tablet every morning. 8. Clonazepam 1 mg p.o. daily at bedtime as needed for insomnia.  If     required, the patient can take up to 2 tablets at bedtime. 9. Topiramate 25 mg 1 tablet daily at bedtime. 10.Prilosec 20 mg 2 capsules every morning. 11.Norvasc 10 mg 1 tablet by mouth every morning. 12.Furosemide 40 mg 1 tablet twice a day.  SOCIAL HISTORY:  The patient lives in [place] by herself.  She is disabled.  She denies any smoking, alcohol or illicit drugs consumption.  FAMILY HISTORY:  Noncontributory.  REVIEW OF SYSTEMS:  All systems negative except as marked or noted in the HPI.  PHYSICAL EXAMINATION:  VITAL SIGNS:  Blood pressure 151/86, heart rate 120, respiratory rate 33, oxygen saturation 96% on 2 liters. GENERAL:  The patient is morbidly obese, lying in bed, mild distress, labile breathing with a shortness of breath with minimal exertion, light talking into full sentences,  of just trying to get out of bed for physical exam and gait evaluation made her really short of breath.  The patient is well-hydrated and obese in appearance. HEENT:  Head and face, normocephalic.  No trauma.  Eyes, normal appearance.  No icterus and no nystagmus.  The patient was waiting corrective glasses.  ENT, mouth and pharynx were normal.  Couple of tooth were missing, but there was no erythema, no exudate.  Moist mucous membrane. CARDIOVASCULAR:  Tachycardic.  No murmurs, no gallops. RESPIRATORY:  There was a significant tachypnea especially with exertion, but there was good air movement bilaterally.  No wheezing.  No crackles. ABDOMEN:  Obese, soft.  No guarding or rebound.  No tenderness. Positive bowel sounds. EXTREMITIES:  With bilateral edema 1+. NEUROLOGIC:   Cranial nerves II through XII intact.  Muscle strength 5/5 bilaterally symmetrically.  The patient was alert, awake and oriented x3.  No focal neurologic deficit was appreciated.  Full evaluation of her gait was unable to be performed secondary to tachycardia and tachypnea with minimal exertion. SKIN:  Normal color, warm, and dry.  No rashes.  No petechiae.  No lymphadenopathy was appreciated.  PERTINENT LABORATORY DATA:  CBC with differential with a white blood cells of 8.3, hemoglobin 9.3, MCV 72.5, platelets 345, PT 30.5 with an INR of 1.01, PTT 24.  BMP was 879.  Comprehensive metabolic panel demonstrated a sodium of 142, potassium 4.1, chloride 104, bicarb 27, blood glucose 136, BUN 12, creatinine 0.53.  Liver function test was normal except for a mild decrease on the patient's albumin blood at 3.4. There was also an EKG that demonstrated nonspecific ST segment and T- wave abnormalities with also early repolarization.  IMAGING STUDIES:  The patient had a chest x-ray that demonstrated stable mild cardiomegaly with no active lung disease.  The patient had a V/Q scan that demonstrated high probability of pulmonary embolism.  ASSESSMENT AND PLAN: 1. Chest pressure and shortness of breath with a V/Q scan suggesting     most likely pulmonary embolism.  Findings on the EKG are also     concerns for pulmonary embolism.  The patient is morbidly obese     with severe sedentaries and that actually make her at high risk for     these.  We are going to admit the patient into a step-down     secondary to elevated heart rate and also tachypnea with minimal     exertion.  We are going to start heparin drip and also Coumadin.     We are going to perform a lower extremity Dopplers and we are going     to get an ABG due to the patient's risk factors for heart condition     specially ACS.  We are going to go ahead and have a cardiac markers     x3 q.6 hours.  We are going to repeat an EKG in the  morning and we     are going to have 2-D echo also because of the elevation of the     patient's BMP.  As part of further stratification, we are going to     check a TSH.  We are going to have a fasting lipid profile and also     hemoglobin A1c. 2. The patient has hypertension.  Plan is to continue with her home     medications.  Blood pressure is at this point pretty well     controlled.  The patient will be  started on low sodium diet. 3. Diabetes.  We are going to hold on the metformin.  We are going to     start the patient on sliding scale insulin and we are going to also     to use Lantus.  We are going to get a hemoglobin A1c and we are     going to follow the patient's ABGs insulin dose to be adjusted     according to requirement for a good control of her blood sugar. 4. Hyperlipidemia.  We are going to check a fasting lipid profile.  We     are going to continue statins. 5. Obstructive sleep apnea.  We are going to restart her CPAP usage at     bedtime. 6. The patient has morbid obesity.  We are going to involve     nutritional counseling.  We are going to follow a low-calorie diet.     The patient had receive counseling about her weight and son advises     that she can follow as an outpatient to try to lose some weight. 7. Gastroesophageal reflux disease.  We are going to continue using     PPI. 8. Allergic rhinitis.  We are going to continue using her fluticasone     spray daily. 9. The patient has history of migraines.  We are going to use     topiramate 25 mg by mouth daily at bedtime. 10.Insomnia.  We are going to use p.r.n. clonazepam as previously     prescribed. 11.DVT prophylaxis.  The patient will be started on heparin drip and     Coumadin dose per pharmacy.     Rosanna Randy, MD     CEM/MEDQ  D:  05/12/2011  T:  05/12/2011  Job:  [id number]  cc:   [facility]  Electronically Signed by Vassie Loll MD on 05/31/2011 10:57:18 AM

## 2011-06-05 ENCOUNTER — Ambulatory Visit: Payer: Self-pay | Admitting: Gastroenterology

## 2011-07-04 LAB — PROTIME-INR

## 2011-07-23 ENCOUNTER — Encounter: Payer: Self-pay | Admitting: Gastroenterology

## 2011-08-10 ENCOUNTER — Ambulatory Visit (HOSPITAL_COMMUNITY)
Admission: RE | Admit: 2011-08-10 | Discharge: 2011-08-10 | Disposition: A | Discharge status: Home / Self Care | Payer: PRIVATE HEALTH INSURANCE | Source: Ambulatory Visit | Attending: Internal Medicine | Admitting: Internal Medicine

## 2011-08-10 DIAGNOSIS — M79609 Pain in unspecified limb: Secondary | ICD-10-CM | POA: Insufficient documentation

## 2011-08-10 DIAGNOSIS — M7989 Other specified soft tissue disorders: Secondary | ICD-10-CM | POA: Insufficient documentation

## 2011-09-10 ENCOUNTER — Telehealth: Payer: Self-pay | Admitting: Internal Medicine

## 2011-10-05 ENCOUNTER — Institutional Professional Consult (permissible substitution): Payer: PRIVATE HEALTH INSURANCE | Admitting: Cardiovascular Disease

## 2011-10-24 ENCOUNTER — Encounter: Payer: Self-pay | Admitting: Cardiovascular Disease

## 2011-10-25 ENCOUNTER — Encounter: Payer: Self-pay | Admitting: Cardiovascular Disease

## 2011-10-25 ENCOUNTER — Ambulatory Visit (INDEPENDENT_AMBULATORY_CARE_PROVIDER_SITE_OTHER): Payer: PRIVATE HEALTH INSURANCE | Admitting: Cardiovascular Disease

## 2011-10-25 ENCOUNTER — Ambulatory Visit (INDEPENDENT_AMBULATORY_CARE_PROVIDER_SITE_OTHER): Payer: PRIVATE HEALTH INSURANCE | Admitting: *Deleted

## 2011-10-25 DIAGNOSIS — E119 Type 2 diabetes mellitus without complications: Secondary | ICD-10-CM

## 2011-10-25 DIAGNOSIS — I2699 Other pulmonary embolism without acute cor pulmonale: Secondary | ICD-10-CM

## 2011-10-25 DIAGNOSIS — Z7901 Long term (current) use of anticoagulants: Secondary | ICD-10-CM

## 2011-10-25 DIAGNOSIS — R609 Edema, unspecified: Secondary | ICD-10-CM

## 2011-10-25 DIAGNOSIS — I10 Essential (primary) hypertension: Secondary | ICD-10-CM

## 2011-10-25 NOTE — Assessment & Plan Note (Signed)
Establish with our coumadin clinic.  Coumadin until at least 5/13  F/U with Dr Jens Som at that time to decide on stopping or not

## 2011-10-25 NOTE — Assessment & Plan Note (Signed)
Well controlled.  Continue current medications and low sodium Dash type diet.    

## 2011-10-25 NOTE — Assessment & Plan Note (Signed)
Dependant from obesity.  Continue current diuretic dose

## 2011-10-25 NOTE — Patient Instructions (Signed)
Your physician wants you to follow-up in: 6 months WITH DR Jens Som You will receive a reminder letter in the mail two months in advance. If you don't receive a letter, please call our office to schedule the follow-up appointment. Your physician recommends that you continue on your current medications as directed. Please refer to the Current Medication list given to you today.  NEEDS TO FOLLOW IN COUMADIN CLINIC DX PE

## 2011-10-25 NOTE — Assessment & Plan Note (Signed)
Discussed low carb diet.  Target hemoglobin A1c is 6.5 or less.  Continue current medications.  

## 2011-10-25 NOTE — Progress Notes (Signed)
60 yo patient of Dr Jens Som.  Referred by primary ? To follow coumadin.  Had PE 5/12.  Has not had coumadin checked in 2 months.  No bleeding problems.  PE likely secondary to obesity.  History of SSCP.  Normal cath by Dr Juanda Chance in 2011 with normal filling pressures, right heart pressures, EF and cors.  No SSCP, palpitations, syncope  Mild chronic LE edema.  Compliant with meds.  Takes 7.5/5 mg of coumadin alternating.  Discussed need to take coumadin for a year and F/U with Dr Jens Som in 6 months.  ROS: Denies fever, malais, weight loss, blurry vision, decreased visual acuity, cough, sputum, SOB, hemoptysis, pleuritic pain, palpitaitons, heartburn, abdominal pain, melena, lower extremity edema, claudication, or rash.  All other systems reviewed and negative   General: Affect appropriate Healthy:  appears stated age HEENT: normal Neck supple with no adenopathy JVP normal no bruits no thyromegaly Lungs clear with no wheezing and good diaphragmatic motion Heart:  S1/S2 no murmur,rub, gallop or click PMI normal Abdomen: benighn, BS positve, no tenderness, no AAA no bruit.  No HSM or HJR Distal pulses intact with no bruits No edema Neuro non-focal Skin warm and dry No muscular weakness  Medications Current Outpatient Prescriptions  Medication Sig Dispense Refill  . amLODipine (NORVASC) 10 MG tablet Take 10 mg by mouth daily.        . clonazePAM (KLONOPIN) 1 MG tablet Take 1 mg by mouth at bedtime as needed.        . furosemide (LASIX) 40 MG tablet Take 40 mg by mouth 2 (two) times daily.        . hydrocodone-acetaminophen (LORCET-HD) 5-500 MG per capsule Take 1 capsule by mouth 2 (two) times daily.        . iron polysaccharides (NIFEREX) 150 MG capsule Take 150 mg by mouth 2 (two) times daily.        Marland Kitchen lisinopril (PRINIVIL,ZESTRIL) 10 MG tablet Take 10 mg by mouth daily.        . metFORMIN (GLUCOPHAGE-XR) 500 MG 24 hr tablet Take 500 mg by mouth daily with breakfast.        .  methocarbamol (ROBAXIN) 500 MG tablet Take 500 mg by mouth 2 (two) times daily.        . nitroGLYCERIN (NITROSTAT) 0.4 MG SL tablet Place 0.4 mg under the tongue every 5 (five) minutes as needed.        Marland Kitchen omeprazole (PRILOSEC) 20 MG capsule Take 40 mg by mouth daily.        . pravastatin (PRAVACHOL) 40 MG tablet Take 40 mg by mouth daily.        Marland Kitchen topiramate (TOPAMAX) 25 MG capsule Take 25 mg by mouth daily.        Marland Kitchen warfarin (COUMADIN) 5 MG tablet Take 5 mg by mouth daily. As directed        . zolpidem (AMBIEN) 10 MG tablet Take 10 mg by mouth at bedtime as needed.        . fluticasone (FLOVENT DISKUS) 50 MCG/BLIST diskus inhaler Inhale 2 puffs into the lungs 2 (two) times daily.          Allergies Review of patient's allergies indicates no known allergies.  Family History: Family History  Problem Relation Age of Onset  . Colon cancer Father   . Breast cancer Sister     Social History: History   Social History  . Marital Status: Divorced    Spouse Name: N/A  Number of Children: N/A  . Years of Education: N/A   Occupational History  . Not on file.   Social History Main Topics  . Smoking status: Never Smoker   . Smokeless tobacco: Not on file  . Alcohol Use: No  . Drug Use: No  . Sexually Active: Not on file   Other Topics Concern  . Not on file   Social History Narrative  . No narrative on file    Electrocardiogram:  NSR 85 LVH otherwise normal  Assessment and Plan

## 2011-11-07 ENCOUNTER — Ambulatory Visit: Payer: PRIVATE HEALTH INSURANCE | Attending: Orthopaedic Surgery | Admitting: Physical Therapy

## 2011-11-07 DIAGNOSIS — M25519 Pain in unspecified shoulder: Secondary | ICD-10-CM | POA: Insufficient documentation

## 2011-11-07 DIAGNOSIS — R5381 Other malaise: Secondary | ICD-10-CM | POA: Insufficient documentation

## 2011-11-07 DIAGNOSIS — M25619 Stiffness of unspecified shoulder, not elsewhere classified: Secondary | ICD-10-CM | POA: Insufficient documentation

## 2011-11-07 DIAGNOSIS — IMO0001 Reserved for inherently not codable concepts without codable children: Secondary | ICD-10-CM | POA: Insufficient documentation

## 2011-11-09 ENCOUNTER — Ambulatory Visit: Payer: PRIVATE HEALTH INSURANCE | Admitting: Physical Therapy

## 2011-11-13 ENCOUNTER — Ambulatory Visit: Payer: PRIVATE HEALTH INSURANCE

## 2011-11-14 ENCOUNTER — Ambulatory Visit: Payer: PRIVATE HEALTH INSURANCE | Attending: Internal Medicine | Admitting: Physical Therapy

## 2011-11-14 DIAGNOSIS — IMO0001 Reserved for inherently not codable concepts without codable children: Secondary | ICD-10-CM | POA: Insufficient documentation

## 2011-11-14 DIAGNOSIS — M25519 Pain in unspecified shoulder: Secondary | ICD-10-CM | POA: Insufficient documentation

## 2011-11-14 DIAGNOSIS — M25619 Stiffness of unspecified shoulder, not elsewhere classified: Secondary | ICD-10-CM | POA: Insufficient documentation

## 2011-11-14 DIAGNOSIS — R5381 Other malaise: Secondary | ICD-10-CM | POA: Insufficient documentation

## 2011-11-16 ENCOUNTER — Ambulatory Visit: Payer: PRIVATE HEALTH INSURANCE | Admitting: Physical Therapy

## 2011-11-21 ENCOUNTER — Encounter: Payer: PRIVATE HEALTH INSURANCE | Admitting: Physical Therapy

## 2011-11-22 ENCOUNTER — Ambulatory Visit (INDEPENDENT_AMBULATORY_CARE_PROVIDER_SITE_OTHER): Payer: PRIVATE HEALTH INSURANCE | Admitting: *Deleted

## 2011-11-22 DIAGNOSIS — Z7901 Long term (current) use of anticoagulants: Secondary | ICD-10-CM

## 2011-11-22 DIAGNOSIS — I2699 Other pulmonary embolism without acute cor pulmonale: Secondary | ICD-10-CM

## 2011-11-22 LAB — POCT INR: INR: 2.9

## 2011-11-23 ENCOUNTER — Ambulatory Visit: Payer: PRIVATE HEALTH INSURANCE | Admitting: Physical Therapy

## 2011-11-28 ENCOUNTER — Ambulatory Visit: Payer: PRIVATE HEALTH INSURANCE | Admitting: Physical Therapy

## 2011-11-30 ENCOUNTER — Encounter: Payer: PRIVATE HEALTH INSURANCE | Admitting: Physical Therapy

## 2011-12-05 ENCOUNTER — Ambulatory Visit: Payer: PRIVATE HEALTH INSURANCE | Admitting: Physical Therapy

## 2011-12-07 ENCOUNTER — Encounter: Payer: PRIVATE HEALTH INSURANCE | Admitting: Physical Therapy

## 2011-12-20 ENCOUNTER — Ambulatory Visit (INDEPENDENT_AMBULATORY_CARE_PROVIDER_SITE_OTHER): Payer: PRIVATE HEALTH INSURANCE | Admitting: *Deleted

## 2011-12-20 DIAGNOSIS — I2699 Other pulmonary embolism without acute cor pulmonale: Secondary | ICD-10-CM

## 2011-12-20 DIAGNOSIS — Z7901 Long term (current) use of anticoagulants: Secondary | ICD-10-CM

## 2011-12-21 ENCOUNTER — Ambulatory Visit: Payer: PRIVATE HEALTH INSURANCE | Admitting: Physical Therapy

## 2011-12-25 ENCOUNTER — Other Ambulatory Visit (HOSPITAL_COMMUNITY): Payer: Self-pay | Admitting: Internal Medicine

## 2011-12-25 ENCOUNTER — Other Ambulatory Visit: Payer: Self-pay | Admitting: Internal Medicine

## 2012-01-03 ENCOUNTER — Ambulatory Visit (INDEPENDENT_AMBULATORY_CARE_PROVIDER_SITE_OTHER): Payer: PRIVATE HEALTH INSURANCE | Admitting: *Deleted

## 2012-01-03 DIAGNOSIS — Z7901 Long term (current) use of anticoagulants: Secondary | ICD-10-CM

## 2012-01-03 DIAGNOSIS — I2699 Other pulmonary embolism without acute cor pulmonale: Secondary | ICD-10-CM

## 2012-01-03 MED ORDER — WARFARIN SODIUM 7.5 MG PO TABS: 7.5000 mg | ORAL_TABLET | Freq: Every day | ORAL | Status: DC

## 2012-01-22 ENCOUNTER — Encounter: Payer: PRIVATE HEALTH INSURANCE | Admitting: *Deleted

## 2012-01-23 ENCOUNTER — Ambulatory Visit (INDEPENDENT_AMBULATORY_CARE_PROVIDER_SITE_OTHER): Payer: PRIVATE HEALTH INSURANCE | Admitting: *Deleted

## 2012-01-23 DIAGNOSIS — Z7901 Long term (current) use of anticoagulants: Secondary | ICD-10-CM

## 2012-01-23 DIAGNOSIS — I2699 Other pulmonary embolism without acute cor pulmonale: Secondary | ICD-10-CM

## 2012-02-21 ENCOUNTER — Ambulatory Visit (INDEPENDENT_AMBULATORY_CARE_PROVIDER_SITE_OTHER): Payer: PRIVATE HEALTH INSURANCE | Admitting: *Deleted

## 2012-02-21 DIAGNOSIS — Z7901 Long term (current) use of anticoagulants: Secondary | ICD-10-CM

## 2012-02-21 DIAGNOSIS — I2699 Other pulmonary embolism without acute cor pulmonale: Secondary | ICD-10-CM

## 2012-02-21 LAB — POCT INR: INR: 3

## 2012-03-05 ENCOUNTER — Other Ambulatory Visit: Payer: Self-pay | Admitting: Internal Medicine

## 2012-03-05 DIAGNOSIS — N63 Unspecified lump in unspecified breast: Secondary | ICD-10-CM

## 2012-03-12 ENCOUNTER — Ambulatory Visit
Admission: RE | Admit: 2012-03-12 | Discharge: 2012-03-12 | Disposition: A | Discharge status: Home / Self Care | Payer: PRIVATE HEALTH INSURANCE | Source: Ambulatory Visit | Attending: Internal Medicine | Admitting: Internal Medicine

## 2012-03-12 ENCOUNTER — Ambulatory Visit: Payer: PRIVATE HEALTH INSURANCE

## 2012-03-12 DIAGNOSIS — N63 Unspecified lump in unspecified breast: Secondary | ICD-10-CM

## 2012-03-12 NOTE — Telephone Encounter (Signed)
Close  

## 2012-03-20 ENCOUNTER — Ambulatory Visit (INDEPENDENT_AMBULATORY_CARE_PROVIDER_SITE_OTHER): Payer: PRIVATE HEALTH INSURANCE

## 2012-03-20 DIAGNOSIS — I2699 Other pulmonary embolism without acute cor pulmonale: Secondary | ICD-10-CM

## 2012-03-20 DIAGNOSIS — Z7901 Long term (current) use of anticoagulants: Secondary | ICD-10-CM

## 2012-03-20 LAB — POCT INR: INR: 4

## 2012-04-03 ENCOUNTER — Ambulatory Visit (INDEPENDENT_AMBULATORY_CARE_PROVIDER_SITE_OTHER): Payer: PRIVATE HEALTH INSURANCE | Admitting: Pharmacist

## 2012-04-03 DIAGNOSIS — I2699 Other pulmonary embolism without acute cor pulmonale: Secondary | ICD-10-CM

## 2012-04-03 DIAGNOSIS — Z7901 Long term (current) use of anticoagulants: Secondary | ICD-10-CM

## 2012-04-03 LAB — POCT INR: INR: 2.4

## 2012-04-22 ENCOUNTER — Other Ambulatory Visit: Payer: Self-pay | Admitting: Cardiology

## 2012-04-22 ENCOUNTER — Encounter: Payer: Self-pay | Admitting: Cardiology

## 2012-04-22 ENCOUNTER — Ambulatory Visit (INDEPENDENT_AMBULATORY_CARE_PROVIDER_SITE_OTHER): Payer: Medicaid Other | Admitting: Cardiology

## 2012-04-22 ENCOUNTER — Ambulatory Visit (INDEPENDENT_AMBULATORY_CARE_PROVIDER_SITE_OTHER): Payer: PRIVATE HEALTH INSURANCE

## 2012-04-22 VITALS — BP 132/88 | HR 70 | Ht 64.0 in | Wt 382.0 lb

## 2012-04-22 DIAGNOSIS — Z7901 Long term (current) use of anticoagulants: Secondary | ICD-10-CM

## 2012-04-22 DIAGNOSIS — I2699 Other pulmonary embolism without acute cor pulmonale: Secondary | ICD-10-CM

## 2012-04-22 DIAGNOSIS — R0609 Other forms of dyspnea: Secondary | ICD-10-CM

## 2012-04-22 DIAGNOSIS — I10 Essential (primary) hypertension: Secondary | ICD-10-CM

## 2012-04-22 DIAGNOSIS — E785 Hyperlipidemia, unspecified: Secondary | ICD-10-CM

## 2012-04-22 NOTE — Assessment & Plan Note (Signed)
Blood pressure controlled. Continue present medications. Potassium and renal function monitored by primary care. 

## 2012-04-22 NOTE — Assessment & Plan Note (Signed)
Continue statin. Lipids and liver monitored by primary care. 

## 2012-04-22 NOTE — Patient Instructions (Signed)
Your physician wants you to follow-up in: ONE YEAR WITH DR CRENSHAW You will receive a reminder letter in the mail two months in advance. If you don't receive a letter, please call our office to schedule the follow-up appointment.  

## 2012-04-22 NOTE — Progress Notes (Signed)
WGN:FAOZHYQM female for fu of pulmonary embolus. She had an echocardiogram performed in December of 2010 that showed normal LV function. A Myoview showed an  Ejection fraction was 55%. The study was limited by motion artifact. However there was a reversible defect in the anteroseptal wall and ischemia cannot be excluded. A cardiac catheterization was therefore performed in March of 2011. LV function was normal and her coronaries were normal. Her left ventricular filling pressures were normal as was her pulmonary pressures. Patient had a pulmonary embolus in June of 2012 and was placed on Coumadin.patient last seen in this office in November of 2012. Since then, she has some dyspnea on exertion which is unchanged. No orthopnea or PND. Chronic pedal edema. No chest pain or bleeding.  Current Outpatient Prescriptions  Medication Sig Dispense Refill  . amLODipine (NORVASC) 10 MG tablet Take 10 mg by mouth daily.        . clonazePAM (KLONOPIN) 1 MG tablet Take 1 mg by mouth at bedtime as needed.        . fluticasone (FLOVENT DISKUS) 50 MCG/BLIST diskus inhaler Inhale 2 puffs into the lungs 2 (two) times daily.        . furosemide (LASIX) 40 MG tablet Take 40 mg by mouth 2 (two) times daily.        . hydrocodone-acetaminophen (LORCET-HD) 5-500 MG per capsule Take 1 capsule by mouth 2 (two) times daily.        . iron polysaccharides (NIFEREX) 150 MG capsule Take 150 mg by mouth 2 (two) times daily.        Marland Kitchen lisinopril (PRINIVIL,ZESTRIL) 10 MG tablet Take 10 mg by mouth daily.        . metFORMIN (GLUCOPHAGE-XR) 500 MG 24 hr tablet Take 500 mg by mouth daily with breakfast.        . methocarbamol (ROBAXIN) 500 MG tablet Take 500 mg by mouth 2 (two) times daily.        . nitroGLYCERIN (NITROSTAT) 0.4 MG SL tablet Place 0.4 mg under the tongue every 5 (five) minutes as needed.        Marland Kitchen omeprazole (PRILOSEC) 20 MG capsule Take 40 mg by mouth daily.        . pravastatin (PRAVACHOL) 40 MG tablet Take 40 mg by  mouth daily.        Marland Kitchen topiramate (TOPAMAX) 25 MG capsule Take 25 mg by mouth daily.        Marland Kitchen warfarin (COUMADIN) 7.5 MG tablet Take 1 tablet (7.5 mg total) by mouth daily. Take as directed by coumadin clinic  35 tablet  1  . zolpidem (AMBIEN) 10 MG tablet Take 10 mg by mouth at bedtime as needed.        Marland Kitchen DISCONTD: warfarin (COUMADIN) 5 MG tablet Take 5 mg by mouth daily. As directed           Past Medical History  Diagnosis Date  . Encounter for long-term (current) use of other medications   . Pica   . Iron deficiency anemia, unspecified   . Other and unspecified hyperlipidemia   . Unspecified essential hypertension   . Edema   . Retinal detachment   . Migraine, unspecified, without mention of intractable migraine without mention of status migrainosus   . Diverticulosis of colon (without mention of hemorrhage)   . Type II or unspecified type diabetes mellitus without mention of complication, not stated as uncontrolled   . Osteoarthrosis, unspecified whether generalized or localized, lower leg   .  Morbid obesity   . Unspecified sleep apnea   . Esophageal reflux   . Pulmonary embolus     No past surgical history on file.  History   Social History  . Marital Status: Divorced    Spouse Name: N/A    Number of Children: N/A  . Years of Education: N/A   Occupational History  . Not on file.   Social History Main Topics  . Smoking status: Never Smoker   . Smokeless tobacco: Not on file  . Alcohol Use: No  . Drug Use: No  . Sexually Active: Not on file   Other Topics Concern  . Not on file   Social History Narrative  . No narrative on file    ROS: no fevers or chills, productive cough, hemoptysis, dysphasia, odynophagia, melena, hematochezia, dysuria, hematuria, rash, seizure activity, orthopnea, PND, pedal edema, claudication. Remaining systems are negative.  Physical Exam: Well-developed morbidly obese in no acute distress.  Skin is warm and dry.  HEENT is normal.   Neck is supple. Chest is clear to auscultation with normal expansion.  Cardiovascular exam is regular rate and rhythm.  Abdominal exam nontender or distended. No masses palpated. Extremities show trace edema. neuro grossly intact  ECG normal sinus rhythm at a rate of 70. Left ventricular hypertrophy. No ST changes.

## 2012-04-22 NOTE — Assessment & Plan Note (Signed)
Continue Coumadin. Goal INR 2-3. At the time of her event she really had no precipitating factors. Given her size and relative immobility she might require Coumadin for life.

## 2012-05-13 ENCOUNTER — Ambulatory Visit (INDEPENDENT_AMBULATORY_CARE_PROVIDER_SITE_OTHER): Payer: PRIVATE HEALTH INSURANCE | Admitting: *Deleted

## 2012-05-13 DIAGNOSIS — Z7901 Long term (current) use of anticoagulants: Secondary | ICD-10-CM

## 2012-05-13 DIAGNOSIS — I2699 Other pulmonary embolism without acute cor pulmonale: Secondary | ICD-10-CM

## 2012-05-13 LAB — POCT INR: INR: 2.3

## 2012-06-10 ENCOUNTER — Ambulatory Visit (INDEPENDENT_AMBULATORY_CARE_PROVIDER_SITE_OTHER): Payer: PRIVATE HEALTH INSURANCE | Admitting: *Deleted

## 2012-06-10 DIAGNOSIS — I2699 Other pulmonary embolism without acute cor pulmonale: Secondary | ICD-10-CM

## 2012-06-10 DIAGNOSIS — Z7901 Long term (current) use of anticoagulants: Secondary | ICD-10-CM

## 2012-06-10 LAB — POCT INR: INR: 1.8

## 2012-07-01 ENCOUNTER — Ambulatory Visit (INDEPENDENT_AMBULATORY_CARE_PROVIDER_SITE_OTHER): Payer: PRIVATE HEALTH INSURANCE | Admitting: *Deleted

## 2012-07-01 DIAGNOSIS — Z7901 Long term (current) use of anticoagulants: Secondary | ICD-10-CM

## 2012-07-01 DIAGNOSIS — I2699 Other pulmonary embolism without acute cor pulmonale: Secondary | ICD-10-CM

## 2012-07-31 ENCOUNTER — Ambulatory Visit (INDEPENDENT_AMBULATORY_CARE_PROVIDER_SITE_OTHER): Payer: PRIVATE HEALTH INSURANCE | Admitting: *Deleted

## 2012-07-31 DIAGNOSIS — Z7901 Long term (current) use of anticoagulants: Secondary | ICD-10-CM

## 2012-07-31 DIAGNOSIS — I2699 Other pulmonary embolism without acute cor pulmonale: Secondary | ICD-10-CM

## 2012-08-19 ENCOUNTER — Ambulatory Visit (INDEPENDENT_AMBULATORY_CARE_PROVIDER_SITE_OTHER): Payer: PRIVATE HEALTH INSURANCE

## 2012-08-19 DIAGNOSIS — Z7901 Long term (current) use of anticoagulants: Secondary | ICD-10-CM

## 2012-08-19 DIAGNOSIS — I2699 Other pulmonary embolism without acute cor pulmonale: Secondary | ICD-10-CM

## 2012-09-09 ENCOUNTER — Ambulatory Visit (INDEPENDENT_AMBULATORY_CARE_PROVIDER_SITE_OTHER): Payer: PRIVATE HEALTH INSURANCE | Admitting: *Deleted

## 2012-09-09 DIAGNOSIS — Z7901 Long term (current) use of anticoagulants: Secondary | ICD-10-CM

## 2012-09-09 DIAGNOSIS — I2699 Other pulmonary embolism without acute cor pulmonale: Secondary | ICD-10-CM

## 2012-09-09 LAB — POCT INR: INR: 1.8

## 2012-09-11 ENCOUNTER — Encounter: Payer: Self-pay | Admitting: Gastroenterology

## 2012-09-18 ENCOUNTER — Ambulatory Visit (HOSPITAL_COMMUNITY)
Admission: RE | Admit: 2012-09-18 | Discharge: 2012-09-18 | Disposition: A | Discharge status: Home / Self Care | Payer: PRIVATE HEALTH INSURANCE | Source: Ambulatory Visit | Attending: Internal Medicine | Admitting: Internal Medicine

## 2012-09-18 DIAGNOSIS — E785 Hyperlipidemia, unspecified: Secondary | ICD-10-CM

## 2012-09-18 DIAGNOSIS — E119 Type 2 diabetes mellitus without complications: Secondary | ICD-10-CM

## 2012-09-18 DIAGNOSIS — I10 Essential (primary) hypertension: Secondary | ICD-10-CM | POA: Insufficient documentation

## 2012-09-18 DIAGNOSIS — I6529 Occlusion and stenosis of unspecified carotid artery: Secondary | ICD-10-CM

## 2012-09-18 NOTE — Progress Notes (Deleted)
Right:  No evidence of DVT or superficial thrombosis.    

## 2012-09-18 NOTE — Progress Notes (Signed)
Bilateral:  No evidence of hemodynamically significant internal carotid artery stenosis.   Vertebral artery flow is antegrade.     

## 2012-09-30 ENCOUNTER — Ambulatory Visit (INDEPENDENT_AMBULATORY_CARE_PROVIDER_SITE_OTHER): Payer: PRIVATE HEALTH INSURANCE

## 2012-09-30 DIAGNOSIS — I2699 Other pulmonary embolism without acute cor pulmonale: Secondary | ICD-10-CM

## 2012-09-30 DIAGNOSIS — Z7901 Long term (current) use of anticoagulants: Secondary | ICD-10-CM

## 2012-10-14 ENCOUNTER — Other Ambulatory Visit: Payer: Self-pay | Admitting: Cardiology

## 2012-10-21 ENCOUNTER — Ambulatory Visit (INDEPENDENT_AMBULATORY_CARE_PROVIDER_SITE_OTHER): Payer: PRIVATE HEALTH INSURANCE | Admitting: *Deleted

## 2012-10-21 DIAGNOSIS — Z7901 Long term (current) use of anticoagulants: Secondary | ICD-10-CM

## 2012-10-21 DIAGNOSIS — I2699 Other pulmonary embolism without acute cor pulmonale: Secondary | ICD-10-CM

## 2012-11-20 ENCOUNTER — Ambulatory Visit (INDEPENDENT_AMBULATORY_CARE_PROVIDER_SITE_OTHER): Payer: PRIVATE HEALTH INSURANCE | Admitting: *Deleted

## 2012-11-20 DIAGNOSIS — Z7901 Long term (current) use of anticoagulants: Secondary | ICD-10-CM

## 2012-11-20 DIAGNOSIS — I2699 Other pulmonary embolism without acute cor pulmonale: Secondary | ICD-10-CM

## 2012-12-23 ENCOUNTER — Ambulatory Visit (INDEPENDENT_AMBULATORY_CARE_PROVIDER_SITE_OTHER): Payer: PRIVATE HEALTH INSURANCE | Admitting: *Deleted

## 2012-12-23 DIAGNOSIS — I2699 Other pulmonary embolism without acute cor pulmonale: Secondary | ICD-10-CM

## 2012-12-23 DIAGNOSIS — Z7901 Long term (current) use of anticoagulants: Secondary | ICD-10-CM

## 2013-01-29 ENCOUNTER — Ambulatory Visit (INDEPENDENT_AMBULATORY_CARE_PROVIDER_SITE_OTHER): Payer: PRIVATE HEALTH INSURANCE

## 2013-01-29 DIAGNOSIS — I2699 Other pulmonary embolism without acute cor pulmonale: Secondary | ICD-10-CM

## 2013-01-29 DIAGNOSIS — Z7901 Long term (current) use of anticoagulants: Secondary | ICD-10-CM

## 2013-01-29 LAB — POCT INR: INR: 4.2

## 2013-04-10 ENCOUNTER — Telehealth: Payer: Self-pay | Admitting: Neurology

## 2013-04-10 DIAGNOSIS — Z9989 Dependence on other enabling machines and devices: Secondary | ICD-10-CM

## 2013-04-10 DIAGNOSIS — G4733 Obstructive sleep apnea (adult) (pediatric): Secondary | ICD-10-CM

## 2013-04-14 ENCOUNTER — Ambulatory Visit (INDEPENDENT_AMBULATORY_CARE_PROVIDER_SITE_OTHER): Payer: PRIVATE HEALTH INSURANCE | Admitting: *Deleted

## 2013-04-14 DIAGNOSIS — I2699 Other pulmonary embolism without acute cor pulmonale: Secondary | ICD-10-CM

## 2013-04-14 DIAGNOSIS — Z7901 Long term (current) use of anticoagulants: Secondary | ICD-10-CM

## 2013-04-14 NOTE — Telephone Encounter (Signed)
Please schedule an appointment with the patient for a retitration,   if she needs face to face first , please schedule that visit . CD

## 2013-04-14 NOTE — Telephone Encounter (Signed)
  Patient reports the following:Therapist from [facility] came out to visit pt at home, checked machine and said machine needs to be replaced and RX also needs to be changed to increase air pressure.   At night feels like she's not getting enough air pressure night - been going on for the past couple of months, machine has been getting hot, water in humidifier has been gone by morning.  Pt has had CPAP for 6 years now and sleeps with it every night, she will not sleep without it.  Feels like she is doing well otherwise, falling asleep well, waking up feeling refreshed, no snoring with mask observed.  Needs RX faxed to [facility] for new CPAP   I called [facility] and spoke to Respiratory Dept: verified Respiratory tech went to home on 04/09/13, turned humidifier down, documented pressure set at 8 cm H2O, no download.  Machine showed an AHI of 7.4 on screen, no download was taken at this time - requested download from [facility] today by phone, they will contact the pt to arrange.

## 2013-04-14 NOTE — Telephone Encounter (Signed)
Face to face sleep consult scheduled with patient for 11 AM on Monday 19th, pt knows to arrive early for appt.  She is aware of Doctor's request to have retitration study before ordering new CPAP equipment.  I will call her on Friday the 16th to remind her to bring her CPAP machine with her to her appointment.

## 2013-04-23 ENCOUNTER — Encounter: Payer: Self-pay | Admitting: Neurology

## 2013-04-23 NOTE — Progress Notes (Signed)
Quick Note:  Patient to return for a full night bipap study. Jewelia Bocchino, MD  ______ 

## 2013-04-27 ENCOUNTER — Encounter: Payer: Self-pay | Admitting: Neurology

## 2013-04-27 ENCOUNTER — Institutional Professional Consult (permissible substitution): Payer: Self-pay | Admitting: Neurology

## 2013-05-07 ENCOUNTER — Encounter: Payer: Self-pay | Admitting: Cardiology

## 2013-05-07 ENCOUNTER — Ambulatory Visit (INDEPENDENT_AMBULATORY_CARE_PROVIDER_SITE_OTHER): Payer: PRIVATE HEALTH INSURANCE | Admitting: *Deleted

## 2013-05-07 ENCOUNTER — Encounter: Payer: Self-pay | Admitting: Neurology

## 2013-05-07 ENCOUNTER — Telehealth: Payer: Self-pay | Admitting: *Deleted

## 2013-05-07 ENCOUNTER — Ambulatory Visit (INDEPENDENT_AMBULATORY_CARE_PROVIDER_SITE_OTHER): Payer: PRIVATE HEALTH INSURANCE | Admitting: Cardiology

## 2013-05-07 VITALS — BP 158/83 | HR 83 | Wt 394.0 lb

## 2013-05-07 DIAGNOSIS — R609 Edema, unspecified: Secondary | ICD-10-CM

## 2013-05-07 DIAGNOSIS — I10 Essential (primary) hypertension: Secondary | ICD-10-CM

## 2013-05-07 DIAGNOSIS — Z7901 Long term (current) use of anticoagulants: Secondary | ICD-10-CM

## 2013-05-07 DIAGNOSIS — R1033 Periumbilical pain: Secondary | ICD-10-CM

## 2013-05-07 DIAGNOSIS — I2699 Other pulmonary embolism without acute cor pulmonale: Secondary | ICD-10-CM

## 2013-05-07 LAB — CBC WITH DIFFERENTIAL/PLATELET
Eosinophils Relative: 0.6 % (ref 0.0–5.0)
HCT: 25.4 % — ABNORMAL LOW (ref 36.0–46.0)
Hemoglobin: 7.6 g/dL — CL (ref 12.0–15.0)
Lymphs Abs: 1.4 10*3/uL (ref 0.7–4.0)
MCV: 65.6 fl — ABNORMAL LOW (ref 78.0–100.0)
Monocytes Absolute: 0.4 10*3/uL (ref 0.1–1.0)
Neutro Abs: 2.9 10*3/uL (ref 1.4–7.7)
Platelets: 242 10*3/uL (ref 150.0–400.0)
RDW: 20 % — ABNORMAL HIGH (ref 11.5–14.6)
WBC: 4.8 10*3/uL (ref 4.5–10.5)

## 2013-05-07 LAB — BASIC METABOLIC PANEL
Calcium: 8.7 mg/dL (ref 8.4–10.5)
Creatinine, Ser: 0.6 mg/dL (ref 0.4–1.2)
GFR: 134.49 mL/min (ref >60.00)
Glucose, Bld: 97 mg/dL (ref 70–99)
Sodium: 141 mEq/L (ref 135–145)

## 2013-05-07 LAB — POCT INR: INR: 3.5

## 2013-05-07 NOTE — Progress Notes (Signed)
HPI: Pleasant female for fu of pulmonary embolus. She had an echocardiogram performed in December of 2010 that showed normal LV function. A Myoview showed an Ejection fraction was 55%. The study was limited by motion artifact. However there was a reversible defect in the anteroseptal wall and ischemia cannot be excluded. A cardiac catheterization was therefore performed in March of 2011. LV function was normal and her coronaries were normal. Her left ventricular filling pressures were normal as was her pulmonary pressures. Patient had a pulmonary embolus in June of 2012 and was placed on Coumadin. Carotid Dopplers in October of 2013 showed noted significant stenosis. Patient last seen in May of 2013. Since then, she has dyspnea on exertion and pedal edema which is chronic. Her pedal edema is somewhat worse. No chest pain.   Current Outpatient Prescriptions  Medication Sig Dispense Refill  . amLODipine (NORVASC) 10 MG tablet Take 10 mg by mouth daily.        . clonazePAM (KLONOPIN) 1 MG tablet Take 1 mg by mouth at bedtime as needed.        . fluticasone (FLOVENT DISKUS) 50 MCG/BLIST diskus inhaler Inhale 2 puffs into the lungs 2 (two) times daily.        . furosemide (LASIX) 40 MG tablet Take 40 mg by mouth 2 (two) times daily.        . hydrocodone-acetaminophen (LORCET-HD) 5-500 MG per capsule Take 1 capsule by mouth 2 (two) times daily.        . iron polysaccharides (NIFEREX) 150 MG capsule Take 150 mg by mouth 2 (two) times daily.        Marland Kitchen lisinopril (PRINIVIL,ZESTRIL) 10 MG tablet Take 10 mg by mouth daily.        . metFORMIN (GLUCOPHAGE-XR) 500 MG 24 hr tablet Take 500 mg by mouth daily with breakfast.        . methocarbamol (ROBAXIN) 500 MG tablet Take 500 mg by mouth 2 (two) times daily.        . nitroGLYCERIN (NITROSTAT) 0.4 MG SL tablet Place 0.4 mg under the tongue every 5 (five) minutes as needed.        Marland Kitchen omeprazole (PRILOSEC) 20 MG capsule Take 40 mg by mouth daily.        .  pravastatin (PRAVACHOL) 40 MG tablet Take 40 mg by mouth daily.        Marland Kitchen warfarin (COUMADIN) 7.5 MG tablet Take as directed by coumadin clinic  35 tablet  3  . zolpidem (AMBIEN) 10 MG tablet Take 10 mg by mouth at bedtime as needed.         No current facility-administered medications for this visit.     Past Medical History  Diagnosis Date  . Encounter for long-term (current) use of other medications   . Pica   . Iron deficiency anemia, unspecified   . Other and unspecified hyperlipidemia   . Unspecified essential hypertension   . Edema   . Retinal detachment   . Migraine, unspecified, without mention of intractable migraine without mention of status migrainosus   . Diverticulosis of colon (without mention of hemorrhage)   . Type II or unspecified type diabetes mellitus without mention of complication, not stated as uncontrolled   . Osteoarthrosis, unspecified whether generalized or localized, lower leg   . Morbid obesity   . Unspecified sleep apnea   . Esophageal reflux   . Pulmonary embolus     History reviewed. No pertinent past surgical history.  History  Social History  . Marital Status: Divorced    Spouse Name: N/A    Number of Children: 2  . Years of Education: N/A   Occupational History  . DISABLED    Social History Main Topics  . Smoking status: Never Smoker   . Smokeless tobacco: Not on file  . Alcohol Use: No  . Drug Use: No  . Sexually Active: Not on file   Other Topics Concern  . Not on file   Social History Narrative   Patient with obesity, hyperventilation and obstructive apneas, successfully treated with CPAP at 10 cm water. Good residual AHI values for the last year and low Epworth score. Compliance is good. [facility] follows her.    Her GERD is better on omeprazole and this has further reduced her nocturnal awakenings. She is still hypertensive and needs urgently to lose weight, this would positivley affect her diabetes as well.       I  convinced her to take her Topamax and take her Lasix in the AM at 20 mg. She was advised of low carb diet and potassium rich foods.     ROS: no fevers or chills, productive cough, hemoptysis, dysphasia, odynophagia, melena, hematochezia, dysuria, hematuria, rash, seizure activity, orthopnea, PND, pedal edema, claudication. Remaining systems are negative.  Physical Exam: Well-developed morbidly obese in no acute distress.  Skin is warm and dry.  HEENT is normal.  Neck is supple.  Chest is clear to auscultation with normal expansion.  Cardiovascular exam is regular rate and rhythm.  Abdominal exam nontender or distended. No masses palpated. Extremities show 1+ edema. neuro grossly intact  ECG sinus rhythm at a rate of 83. Left ventricular hypertrophy.

## 2013-05-07 NOTE — Assessment & Plan Note (Signed)
Blood pressure elevated but she follows this at home and it is typically controlled. Continue present medications.

## 2013-05-07 NOTE — Telephone Encounter (Signed)
Spoke with pt, critical HGB of 7.6 called to the office. She has not noticed any bleeding or black stools. She has a follow up appt with her PCP on Monday. Spoke with dr avbuere's office, will fax labs to them as soon as available. They will contact the pt.

## 2013-05-07 NOTE — Assessment & Plan Note (Signed)
Continue Coumadin. Goal INR 2-3. At the time of her event she really had no precipitating factors. Given her size and relative immobility she will require anticoagulation for life. She has asked about xeralto; she will check with her insurance company; if covered we will change from coumadin to xeralto. Check CBC.

## 2013-05-07 NOTE — Assessment & Plan Note (Signed)
Plan continue Lasix. Check potassium and renal function. She most likely has a component of pulmonary hypertension related to her prior pulmonary embolus, obstructive sleep apnea and obesity hypoventilation syndrome. Repeat echocardiogram for RV function.

## 2013-05-07 NOTE — Patient Instructions (Addendum)
Your physician wants you to follow-up in: ONE YEAR WITH DR Shelda Pal will receive a reminder letter in the mail two months in advance. If you don't receive a letter, please call our office to schedule the follow-up appointment.   Your physician has requested that you have an echocardiogram. Echocardiography is a painless test that uses sound waves to create images of your heart. It provides your doctor with information about the size and shape of your heart and how well your heart's chambers and valves are working. This procedure takes approximately one hour. There are no restrictions for this procedure.   Your physician recommends that you HAVE LAB WORK TODAY  XARELTO TO REPLACE WARFARIN

## 2013-05-07 NOTE — Assessment & Plan Note (Signed)
Discussed weight loss. 

## 2013-05-08 ENCOUNTER — Ambulatory Visit (INDEPENDENT_AMBULATORY_CARE_PROVIDER_SITE_OTHER): Payer: Medicare Other | Admitting: Neurology

## 2013-05-08 ENCOUNTER — Encounter: Payer: Self-pay | Admitting: Neurology

## 2013-05-08 VITALS — BP 136/80 | HR 101 | Temp 96.0°F | Ht 64.5 in | Wt 389.0 lb

## 2013-05-08 DIAGNOSIS — E662 Morbid (severe) obesity with alveolar hypoventilation: Secondary | ICD-10-CM

## 2013-05-08 DIAGNOSIS — G4730 Sleep apnea, unspecified: Secondary | ICD-10-CM

## 2013-05-08 NOTE — Patient Instructions (Signed)
Sleep Apnea Sleep apnea is disorder that affects a person's sleep. A person with sleep apnea has abnormal pauses in their breathing when they sleep. It is hard for them to get a good sleep. This makes a person tired during the day. It also can lead to other physical problems. There are three types of sleep apnea. One type is when breathing stops for a short time because your airway is blocked (obstructive sleep apnea). Another type is when the brain sometimes fails to give the normal signal to breathe to the muscles that control your breathing (central sleep apnea). The third type is a combination of the other two types. HOME CARE  Do not sleep on your back. Try to sleep on your side.  Take all medicine as told by your doctor.  Avoid alcohol, calming medicines (sedatives), and depressant drugs.  Try to lose weight if you are overweight. Talk to your doctor about a healthy weight goal. Your doctor may have you use a device that helps to open your airway. It can help you get the air that you need. It is called a positive airway pressure (PAP) device. There are three types of PAP devices:  Continuous positive airway pressure (CPAP) device.  Nasal expiratory positive airway pressure (EPAP) device.  Bilevel positive airway pressure (BPAP) device. MAKE SURE YOU:  Understand these instructions.  Will watch your condition.  Will get help right away if you are not doing well or get worse. Document Released: 09/04/2008 Document Revised: 11/12/2012 Document Reviewed: 03/29/2012 [facility] Patient Information 2014 [place], [place]. Calorie Counting Diet A calorie counting diet requires you to eat the number of calories that are right for you in a day. Calories are the measurement of how much energy you get from the food you eat. Eating the right amount of calories is important for staying at a healthy weight. If you eat too many calories, your body will store them as fat and you may gain weight. If you  eat too few calories, you may lose weight. Counting the number of calories you eat during a day will help you know if you are eating the right amount. A Registered Dietitian can determine how many calories you need in a day. The amount of calories needed varies from person to person. If your goal is to lose weight, you will need to eat fewer calories. Losing weight can benefit you if you are overweight or have health problems such as heart disease, high blood pressure, or diabetes. If your goal is to gain weight, you will need to eat more calories. Gaining weight may be necessary if you have a certain health problem that causes your body to need more energy. TIPS Whether you are increasing or decreasing the number of calories you eat during a day, it may be hard to get used to changes in what you eat and drink. The following are tips to help you keep track of the number of calories you eat.  Measure foods at home with measuring cups. This helps you know the amount of food and number of calories you are eating.  Restaurants often serve food in amounts that are larger than 1 serving. While eating out, estimate how many servings of a food you are given. For example, a serving of cooked rice is  cup or about the size of half of a fist. Knowing serving sizes will help you be aware of how much food you are eating at restaurants.  Ask for smaller portion sizes  or child-size portions at restaurants.  Plan to eat half of a meal at a restaurant. Take the rest home or share the other half with a friend.  Read the Nutrition Facts panel on food labels for calorie content and serving size. You can find out how many servings are in a package, the size of a serving, and the number of calories each serving has.  For example, a package might contain 3 cookies. The Nutrition Facts panel on that package says that 1 serving is 1 cookie. Below that, it will say there are 3 servings in the container. The calories section  of the Nutrition Facts label says there are 90 calories. This means there are 90 calories in 1 cookie (1 serving). If you eat 1 cookie you have eaten 90 calories. If you eat all 3 cookies, you have eaten 270 calories (3 servings x 90 calories = 270 calories). The list below tells you how big or small some common portion sizes are.  1 oz.........4 stacked dice.  3 oz........Marland KitchenDeck of cards.  1 tsp.......Marland KitchenTip of little finger.  1 tbs......Marland KitchenMarland KitchenThumb.  2 tbs.......Marland KitchenGolf ball.   cup......Marland KitchenHalf of a fist.  1 cup.......Marland KitchenA fist. KEEP A FOOD LOG Write down every food item you eat, the amount you eat, and the number of calories in each food you eat during the day. At the end of the day, you can add up the total number of calories you have eaten. It may help to keep a list like the one below. Find out the calorie information by reading the Nutrition Facts panel on food labels. Breakfast  Bran cereal (1 cup, 110 calories).  Fat-free milk ( cup, 45 calories). Snack  Apple (1 medium, 80 calories). Lunch  Spinach (1 cup, 20 calories).  Tomato ( medium, 20 calories).  Chicken breast strips (3 oz, 165 calories).  Shredded cheddar cheese ( cup, 110 calories).  Light [place] dressing (2 tbs, 60 calories).  Whole-wheat bread (1 slice, 80 calories).  Tub margarine (1 tsp, 35 calories).  Vegetable soup (1 cup, 160 calories). Dinner  Pork chop (3 oz, 190 calories).  Brown rice (1 cup, 215 calories).  Steamed broccoli ( cup, 20 calories).  Strawberries (1  cup, 65 calories).  Whipped cream (1 tbs, 50 calories). Daily Calorie Total: 1425 Document Released: 11/26/2005 Document Revised: 02/18/2012 Document Reviewed: 05/23/2007 [facility] Patient Information 2014 [place], [place]. Exercise to Lose Weight Exercise and a healthy diet may help you lose weight. Your doctor may suggest specific exercises. EXERCISE IDEAS AND TIPS  Choose low-cost things you enjoy doing, such as walking,  bicycling, or exercising to workout videos.  Take stairs instead of the elevator.  Walk during your lunch break.  Park your car further away from work or school.  Go to a gym or an exercise class.  Start with 5 to 10 minutes of exercise each day. Build up to 30 minutes of exercise 4 to 6 days a week.  Wear shoes with good support and comfortable clothes.  Stretch before and after working out.  Work out until you breathe harder and your heart beats faster.  Drink extra water when you exercise.  Do not do so much that you hurt yourself, feel dizzy, or get very short of breath. Exercises that burn about 150 calories:  Running 1  miles in 15 minutes.  Playing volleyball for 45 to 60 minutes.  Washing and waxing a car for 45 to 60 minutes.  Playing touch football for 45 minutes.  miles in 35 minutes.  Pushing a stroller 1  miles in 30 minutes.  Playing basketball for 30 minutes.  Raking leaves for 30 minutes.  Bicycling 5 miles in 30 minutes.  Walking 2 miles in 30 minutes.  Dancing for 30 minutes.  Shoveling snow for 15 minutes.  Swimming laps for 20 minutes.  Walking up stairs for 15 minutes.  Bicycling 4 miles in 15 minutes.  Gardening for 30 to 45 minutes.  Jumping rope for 15 minutes.  Washing windows or floors for 45 to 60 minutes. Document Released: 12/29/2010 Document Revised: 02/18/2012 Document Reviewed: 12/29/2010 [employer] Patient Information 2014 [employer].  

## 2013-05-08 NOTE — Assessment & Plan Note (Signed)
BMI over 55- possible hypoventilation syndrome.

## 2013-05-08 NOTE — Progress Notes (Signed)
[facility]  Provider:  Dr Remmington Teters Referring Provider: Dorrene German, MD Primary Care Physician:  Dorrene German, MD  Chief Complaint  Patient presents with  . Follow-up    C Pap # 10    HPI:  Brenda Clark is a 62 y.o. female here as a referral from Dr. Concepcion Elk for  CPAP service. During her last visit in October 2010, I reviewed her sleep study from 2008 performed under Dr. Alona Bene care. The patient had at the time he was CPAP compliantly and her residual AHI was less than 2.  The patient is here today good her sleep apnea machine as nor longer working effectively the patient and Nicole Cella Epworth sleepiness score at 5 points and has still tried to use her machine but could not provide the adequate pressure, Is talked to her [employer] which initiated that she should see a sleep medicine physician in followup. She notice that she is snoring again and that she has very fragmented sleep waking up gasping for air. She may wake herself up from snoring. The patient still is morbidly obese and has been diagnosed recently with severe anemia which may contribute to her significant fatigue of 56 points rather than excessive daytime sleepiness. The patient further has a main complaint of nonrestorative sleep on CPAP. She awakens with swollen ankles and has continued ankle edema throughout the day.  The patient that goes to bed around 9:38 PM and arises at 5:30 AM, she estimates her all the old nightly sleep time only between 4 and 5 hours at the moles, since her sleep is often interrupted she has to go at least twice at night to the bathroom. She made slipped into a daytime nap especially if she watches TV or sits still for a prolonged period of time. She does not schedule these  Naps.  BMI over 55 . Her main risk factor for a sleep breathing disorder is her severely elevated BMI, morbid obesity. We were unable to obtain a download in office, there was  no chip card  in her CPAP machine, she reports a download by  her DME just last week. [facility].       Review of Systems: Out of a complete 14 system review, the patient complains of only the following symptoms, and all other reviewed systems are negative. *see above -  poor ambulation on cane , FSS,  superobesity.  History   Social History  . Marital Status: Divorced    Spouse Name: N/A    Number of Children: 2  . Years of Education: N/A   Occupational History  . DISABLED     since 2007   Social History Main Topics  . Smoking status: Never Smoker   . Smokeless tobacco: Not on file  . Alcohol Use: No  . Drug Use: No  . Sexually Active: Not on file   Other Topics Concern  . Not on file   Social History Narrative   Patient with obesity, hyperventilation and obstructive apneas, successfully treated with CPAP at 10 cm water. Good residual AHI values for the last year and low Epworth score. Compliance is good. [facility] follows her.    Her GERD is better on omeprazole and this has further reduced her nocturnal awakenings. She is still hypertensive and needs urgently to lose weight, this would positivley affect her diabetes as well.       I convinced her to take her Topamax and take her Lasix in  the AM at 20 mg. She was advised of low carb diet and potassium rich foods.     Family History  Problem Relation Age of Onset  . Colon cancer Father   . Breast cancer Sister   . Diabetes Sister     Past Medical History  Diagnosis Date  . Encounter for long-term (current) use of other medications   . Pica   . Iron deficiency anemia, unspecified   . Other and unspecified hyperlipidemia   . Unspecified essential hypertension   . Edema   . Retinal detachment   . Migraine, unspecified, without mention of intractable migraine without mention of status migrainosus   . Diverticulosis of colon (without mention of hemorrhage)   . Type II or unspecified type diabetes mellitus without  mention of complication, not stated as uncontrolled   . Osteoarthrosis, unspecified whether generalized or localized, lower leg   . Morbid obesity   . Unspecified sleep apnea   . Esophageal reflux   . Pulmonary embolus   . GERD (gastroesophageal reflux disease)   . Headache, migraine   . Chronic pain   . OSA (obstructive sleep apnea)   . Hyperlipidemia   . Arthritis     Past Surgical History  Procedure Laterality Date  . Cataract extraction    . Lumbar disc surgery    . Tubal ligation      Current Outpatient Prescriptions  Medication Sig Dispense Refill  . amLODipine (NORVASC) 10 MG tablet Take 10 mg by mouth daily.        . fluticasone (FLOVENT DISKUS) 50 MCG/BLIST diskus inhaler Inhale 2 puffs into the lungs 2 (two) times daily.        . furosemide (LASIX) 40 MG tablet Take 40 mg by mouth 2 (two) times daily.        . hydrocodone-acetaminophen (LORCET-HD) 5-500 MG per capsule Take 1 capsule by mouth 2 (two) times daily.        Marland Kitchen lisinopril (PRINIVIL,ZESTRIL) 10 MG tablet Take 10 mg by mouth daily.        . metFORMIN (GLUCOPHAGE-XR) 500 MG 24 hr tablet Take 500 mg by mouth daily with breakfast.        . nitroGLYCERIN (NITROSTAT) 0.4 MG SL tablet Place 0.4 mg under the tongue every 5 (five) minutes as needed.        Marland Kitchen omeprazole (PRILOSEC) 20 MG capsule Take 40 mg by mouth daily.        Marland Kitchen warfarin (COUMADIN) 7.5 MG tablet Take as directed by coumadin clinic  35 tablet  3  . zolpidem (AMBIEN) 10 MG tablet Take 10 mg by mouth at bedtime as needed.         No current facility-administered medications for this visit.    Allergies as of 05/08/2013  . (No Known Allergies)    Vitals: BP 136/80  Pulse 101  Temp(Src) 96 F (35.6 C) (Oral)  Ht 5' 4.5" (1.638 m)  Wt 389 lb (176.449 kg)  BMI 65.76 kg/m2 Last Weight:  Wt Readings from Last 1 Encounters:  05/08/13 389 lb (176.449 kg)   Last Height:   Ht Readings from Last 1 Encounters:  05/08/13 5' 4.5" (1.638 m)      Physical exam:  General: The patient is awake, alert and appears not in acute distress. The patient is well groomed. Head: Normocephalic, atraumatic. Neck is supple. Mallampati 3 , neck circumference:17.5 inches , there is no evidence of retrognathia, the patient can breathe through either nightly on  unrestricted. Cardiovascular:  Regular rate and rhythm 49-55  without  murmurs or carotid bruit, and without distended neck veins. Respiratory: Lungs are clear to auscultation. Skin:  With ankle  edema, . Trunk: BMI  Highly elevated .  Neurologic exam : The patient is awake and alert, oriented to place and time.  Memory subjective  described as intact. There is a normal attention span & concentration ability. Speech is fluent without  dysarthria, dysphonia or aphasia. Mood and affect are appropriate.  Cranial nerves: Pupils are equal and slow reactive to light. detached retina once in the left eye  And bilateral  cataract removal.   Funduscopic exam . Extraocular movements  in vertical and horizontal planes intact and without nystagmus. Visual fields by finger perimetry are intact. Hearing to finger rub intact.  Facial sensation intact to fine touch. Facial motor strength is symmetric and tongue moves midline.  Motor exam:   Normal tone and normal muscle bulk and symmetric normal strength in all extremities. Her weight resulted in her non ambulatory state , she gets easily SOB.   Sensory:  Fine touch, pinprick and vibration were tested in all extremities. Ankle and feet are numb to fine touch - Proprioception is tested in the upper extremities only. This was normal.  Coordination: Rapid alternating movements in the fingers/hands is tested and normal. Finger-to-nose maneuver tested and normal without evidence of ataxia, dysmetria or tremor.  Gait and station: Patient walks with assistive device  Within  limits.  Wide based gait. Stance is stable and normal. Tandem gait is impossible , turns  with 6  Steps .  Deep tendon reflexes: in the  upper and lower extremities are symmetric and attenuated . Babinski maneuver response is  downgoing.   Assessment:  After physical and neurologic examination, review of laboratory studies, imaging, neurophysiology testing and pre-existing records, assessment will be reviewed on the problem list.  Plan:  Treatment plan and additional workup will be reviewed under Problem List.      To return for a split study with CO2 measurements. superobesity is main risk factor. Educational material about OSA, CSA , provided and medical risks arising were explained.  Patient was 100 % compliant in the past.

## 2013-05-08 NOTE — Assessment & Plan Note (Signed)
Patient was diagnosed in January 2008 at the [facility] with OSA- Dr. Jetty Duhamel. BMI at the time 57.5 AHI 18.9. CPAP titrated to send 10 cm water pressure. [facility] and [facility]. The patient was last seen  2010 and is considered a new pt.

## 2013-05-11 ENCOUNTER — Telehealth: Payer: Self-pay | Admitting: Cardiology

## 2013-05-11 NOTE — Telephone Encounter (Signed)
Spoke with pt, she would like to change to xarelto. Will forward for dr Jens Som review

## 2013-05-11 NOTE — Telephone Encounter (Signed)
Refer to coumadin clinic and change coumadin to xeralto. Brenda Clark

## 2013-05-11 NOTE — Telephone Encounter (Signed)
Spoke with pt, Aware of dr Ludwig Clarks recommendations. She has an appt 05-25-13 and the change will be made at that time.

## 2013-05-11 NOTE — Telephone Encounter (Signed)
New Problem  Pt is calling you regarding her medication replacement. She asked if you could call her back.

## 2013-05-17 ENCOUNTER — Ambulatory Visit (INDEPENDENT_AMBULATORY_CARE_PROVIDER_SITE_OTHER): Payer: Medicare Other | Admitting: Neurology

## 2013-05-17 VITALS — BP 138/85

## 2013-05-17 DIAGNOSIS — G4733 Obstructive sleep apnea (adult) (pediatric): Secondary | ICD-10-CM

## 2013-05-17 DIAGNOSIS — E662 Morbid (severe) obesity with alveolar hypoventilation: Secondary | ICD-10-CM

## 2013-05-17 DIAGNOSIS — G4730 Sleep apnea, unspecified: Secondary | ICD-10-CM

## 2013-05-18 NOTE — Progress Notes (Signed)
See media tab for full report  PT HAD SUCCESSFUL SPLIT NIGHT STUDY, CPAP APPLIED AND WAS TITRATED TO 9 CM H2O, PT WORE F&P ESON MASK WITH NO CHINSTRAP. TECH: JAMES T. HUNDLEY, RPSGT, RST (UNABLE TO ACCESS EPIC), ENCOUNTER INFO ENTERED NEXT DAY BY Aleece Loyd, RPSGT

## 2013-05-19 ENCOUNTER — Other Ambulatory Visit: Payer: Self-pay | Admitting: Neurology

## 2013-05-19 ENCOUNTER — Other Ambulatory Visit (HOSPITAL_COMMUNITY): Payer: PRIVATE HEALTH INSURANCE

## 2013-05-19 DIAGNOSIS — I2699 Other pulmonary embolism without acute cor pulmonale: Secondary | ICD-10-CM

## 2013-05-19 DIAGNOSIS — G4733 Obstructive sleep apnea (adult) (pediatric): Secondary | ICD-10-CM

## 2013-05-20 ENCOUNTER — Encounter: Payer: Self-pay | Admitting: *Deleted

## 2013-05-20 ENCOUNTER — Encounter: Payer: Medicare Other | Admitting: Neurology

## 2013-05-20 ENCOUNTER — Telehealth: Payer: Self-pay | Admitting: *Deleted

## 2013-05-25 ENCOUNTER — Ambulatory Visit (INDEPENDENT_AMBULATORY_CARE_PROVIDER_SITE_OTHER): Payer: PRIVATE HEALTH INSURANCE

## 2013-05-25 ENCOUNTER — Ambulatory Visit (HOSPITAL_COMMUNITY): Payer: PRIVATE HEALTH INSURANCE | Attending: Cardiology

## 2013-05-25 DIAGNOSIS — E785 Hyperlipidemia, unspecified: Secondary | ICD-10-CM | POA: Insufficient documentation

## 2013-05-25 DIAGNOSIS — R0602 Shortness of breath: Secondary | ICD-10-CM

## 2013-05-25 DIAGNOSIS — Z86711 Personal history of pulmonary embolism: Secondary | ICD-10-CM | POA: Insufficient documentation

## 2013-05-25 DIAGNOSIS — I10 Essential (primary) hypertension: Secondary | ICD-10-CM | POA: Insufficient documentation

## 2013-05-25 DIAGNOSIS — R609 Edema, unspecified: Secondary | ICD-10-CM | POA: Insufficient documentation

## 2013-05-25 DIAGNOSIS — I2699 Other pulmonary embolism without acute cor pulmonale: Secondary | ICD-10-CM

## 2013-05-25 DIAGNOSIS — R0989 Other specified symptoms and signs involving the circulatory and respiratory systems: Secondary | ICD-10-CM | POA: Insufficient documentation

## 2013-05-25 DIAGNOSIS — Z7901 Long term (current) use of anticoagulants: Secondary | ICD-10-CM

## 2013-05-25 DIAGNOSIS — E119 Type 2 diabetes mellitus without complications: Secondary | ICD-10-CM | POA: Insufficient documentation

## 2013-05-25 DIAGNOSIS — R0609 Other forms of dyspnea: Secondary | ICD-10-CM | POA: Insufficient documentation

## 2013-05-25 NOTE — Progress Notes (Signed)
Echocardiogram performed.  

## 2013-06-01 ENCOUNTER — Other Ambulatory Visit: Payer: Self-pay | Admitting: Cardiology

## 2013-06-22 ENCOUNTER — Telehealth: Payer: Self-pay | Admitting: Cardiology

## 2013-06-22 NOTE — Telephone Encounter (Signed)
Spoke with pt and moved appt out to Friday.

## 2013-06-22 NOTE — Telephone Encounter (Signed)
New problem   Pt cxld appt due to having back trouble--she wants to schedule more than 5 days out-today's appt was cxld & needs to be r/s

## 2013-06-30 ENCOUNTER — Ambulatory Visit (INDEPENDENT_AMBULATORY_CARE_PROVIDER_SITE_OTHER): Payer: PRIVATE HEALTH INSURANCE | Admitting: *Deleted

## 2013-06-30 DIAGNOSIS — Z7901 Long term (current) use of anticoagulants: Secondary | ICD-10-CM

## 2013-06-30 DIAGNOSIS — I2699 Other pulmonary embolism without acute cor pulmonale: Secondary | ICD-10-CM

## 2013-07-11 ENCOUNTER — Encounter: Payer: Self-pay | Admitting: Neurology

## 2013-07-16 ENCOUNTER — Ambulatory Visit (INDEPENDENT_AMBULATORY_CARE_PROVIDER_SITE_OTHER): Payer: PRIVATE HEALTH INSURANCE | Admitting: *Deleted

## 2013-07-16 DIAGNOSIS — Z7901 Long term (current) use of anticoagulants: Secondary | ICD-10-CM

## 2013-07-16 DIAGNOSIS — I2699 Other pulmonary embolism without acute cor pulmonale: Secondary | ICD-10-CM

## 2013-07-30 ENCOUNTER — Ambulatory Visit: Payer: Medicare Other | Admitting: Neurology

## 2013-08-14 ENCOUNTER — Ambulatory Visit: Payer: Medicare Other | Admitting: Neurology

## 2013-09-11 ENCOUNTER — Ambulatory Visit (INDEPENDENT_AMBULATORY_CARE_PROVIDER_SITE_OTHER): Payer: Medicare Other | Admitting: Neurology

## 2013-09-11 ENCOUNTER — Encounter: Payer: Self-pay | Admitting: Neurology

## 2013-09-11 VITALS — BP 148/76 | HR 90 | Temp 98.6°F | Resp 17 | Ht 64.5 in

## 2013-09-11 DIAGNOSIS — E662 Morbid (severe) obesity with alveolar hypoventilation: Secondary | ICD-10-CM | POA: Insufficient documentation

## 2013-09-11 DIAGNOSIS — I2699 Other pulmonary embolism without acute cor pulmonale: Secondary | ICD-10-CM

## 2013-09-11 DIAGNOSIS — G4733 Obstructive sleep apnea (adult) (pediatric): Secondary | ICD-10-CM

## 2013-09-11 DIAGNOSIS — G4730 Sleep apnea, unspecified: Secondary | ICD-10-CM

## 2013-09-11 NOTE — Progress Notes (Signed)
Brenda Clark  Provider:  Melvyn Clark, M D  Referring Provider: Dorrene German, MD Primary Care Physician:  Brenda German, MD  Chief Complaint  Patient presents with  . Follow-up    CPAP    HPI:  Brenda Clark is a 62 y.o. female  Is seen here as a referral/ revisit  from Dr. Concepcion Clark for severe obesity hypoventilation and status post pulmonary embolism, OSA.    Mrs. Brenda Clark has been an established patient of our practice was at risk for possible obesity hypoventilation syndrome 2 tremor BMI over 55. She was last seen on May 30 , 2014.  She presented with ankle edema and she has in short of breath with minimal exertion. At the time I ordered a return for a split study with CO2 measurements.  The patient had a history of OSA,  obesity hypoventilation and pulmonary embolism.     Sleep study @[facility]  sleep Epworth 12, BMI  68.0 ;  05-17-13 , AHi 52.6 , RDI was 62.4. Nadir 83% 02 with 47.6 minutes of desaturation time. Titrated from 5 to 9 cm H20. Reduction in desaturation to 11.6 minutes. CPAP issued 06-16-13 .    She reports today a  100% compliance, the same as  she used her previous CPAP.   She had trouble finding restful sleep and reqiured a Nurse, adult. CPAP downloads in august were excellent with residual AHI of 0.8,  have been taken place in early May.. Reviewed and discussed  today's download in office dated 10-to-2014 J the residual apnea hypotony index of 0.6, average daily time in therapy 8 hours and 10 minutes and the machine is set at 9 cm water pressure was a 2 cm EPR or roll she has minimal weeks and high compliance is good resolution of her apnea.  She has to adjust her mask frequently , air will leak, she has large gaps in her dentition, allowing for little structural support.  CPAP setting 9 cm from previously 10 cm water.  Patients sleep hygiene has improved , she f goes to bed at 9.30 PM and has 2 bathroom breaks, sleeps promptly with the CPAP on,   No caffeine in AM any more, No sodas in daytime, no snoring or dry mouth with CPAP,    PLan : No change in setting. BMI unchanged - low carb diet information given and discussed.  SOB - oxygen levels may need separate evaluation.    Review of Systems: Out of a complete 14 system review, the patient complains of only the following symptoms, and all other reviewed systems are negative. SOB,  Obesity, gait disorder sec to weight.  Epworth 4 poins today, FSS 17 , GDS 2 points.   History   Social History  . Marital Status: Divorced    Spouse Name: N/A    Number of Children: 2  . Years of Education: HS   Occupational History  . DISABLED     since 2007   Social History Main Topics  . Smoking status: Never Smoker   . Smokeless tobacco: Never Used  . Alcohol Use: No  . Drug Use: No  . Sexual Activity: Not on file   Other Topics Concern  . Not on file   Social History Narrative   Patient with obesity, hyperventilation and obstructive apneas, successfully treated with CPAP at 10 cm water. Good residual AHI values for the last year and low Epworth score. Compliance is good. [facility] follows her.    Her GERD  is better on omeprazole and this has further reduced her nocturnal awakenings. She is still hypertensive and needs urgently to lose weight, this would positivley affect her diabetes as well.       I convinced her to take her Topamax and take her Lasix in the AM at 20 mg. She was advised of low carb diet and potassium rich foods.       Patient lives at home alone.   Caffeine Use: occasionally    Family History  Problem Relation Age of Onset  . Colon cancer Father   . Breast cancer Sister   . Diabetes Sister     Past Medical History  Diagnosis Date  . Encounter for long-term (current) use of other medications   . Pica   . Iron deficiency anemia, unspecified   . Other and unspecified hyperlipidemia   . Unspecified essential hypertension   . Edema   . Retinal  detachment   . Migraine, unspecified, without mention of intractable migraine without mention of status migrainosus   . Diverticulosis of colon (without mention of hemorrhage)   . Type II or unspecified type diabetes mellitus without mention of complication, not stated as uncontrolled   . Osteoarthrosis, unspecified whether generalized or localized, lower leg   . Morbid obesity   . Unspecified sleep apnea   . Esophageal reflux   . Pulmonary embolus   . GERD (gastroesophageal reflux disease)   . Headache, migraine   . Chronic pain   . OSA (obstructive sleep apnea)   . Hyperlipidemia   . Arthritis     Past Surgical History  Procedure Laterality Date  . Cataract extraction    . Lumbar disc surgery    . Tubal ligation      Current Outpatient Prescriptions  Medication Sig Dispense Refill  . amLODipine (NORVASC) 10 MG tablet Take 10 mg by mouth daily.        . fluticasone (FLOVENT DISKUS) 50 MCG/BLIST diskus inhaler Inhale 2 puffs into the lungs 2 (two) times daily.        . furosemide (LASIX) 40 MG tablet Take 40 mg by mouth 2 (two) times daily.        . hydrocodone-acetaminophen (LORCET-HD) 5-500 MG per capsule Take 1 capsule by mouth 2 (two) times daily.        Marland Kitchen lisinopril (PRINIVIL,ZESTRIL) 10 MG tablet Take 10 mg by mouth daily.        . metFORMIN (GLUCOPHAGE-XR) 500 MG 24 hr tablet Take 500 mg by mouth daily with breakfast.       . nitroGLYCERIN (NITROSTAT) 0.4 MG SL tablet Place 0.4 mg under the tongue every 5 (five) minutes as needed.        Marland Kitchen omeprazole (PRILOSEC) 20 MG capsule Take 40 mg by mouth daily.        Marland Kitchen warfarin (COUMADIN) 7.5 MG tablet TAKE AS DIRECTED BY COUMADIN CLINIC  35 tablet  3  . zolpidem (AMBIEN) 10 MG tablet Take 10 mg by mouth at bedtime as needed. Taking as needed, not daily.       No current facility-administered medications for this visit.    Allergies as of 09/11/2013  . (No Known Allergies)    Vitals: BP 148/76  Pulse 90  Temp(Src) 98.6 F  (37 C) (Oral)  Resp 17  Ht 5' 4.5" (1.638 m) Last Weight:  Wt Readings from Last 1 Encounters:  05/08/13 389 lb (176.449 kg)   Last Height:   Ht Readings from Last 1  Encounters:  09/11/13 5' 4.5" (1.638 m)   Physical exam:  General: The patient is awake, alert and appears not in acute distress. The patient is morbidly obese with ankle edema.  Head: Normocephalic, atraumatic. Neck is supple. Mallampati 3  neck circumference: 16.inches  Edentulous.  Cardiovascular:  Regular rate and rhythm , without  murmurs or carotid bruit, and without distended neck veins. Respiratory: Lungs are clear to auscultation.  Neurologic exam : The patient is awake and alert, oriented to place and time.  Memory subjective  described as intact. There is a normal attention span & concentration ability.  Speech is fluent without   dysarthria, dysphonia or aphasia. Mood and affect are appropriate.  Cranial nerves: Pupils are equal and briskly reactive to light.  Extraocular movements  in vertical and horizontal planes intact and without nystagmus.  Visual fields by finger perimetry are intact. Hearing to finger rub intact.   Facial motor strength is symmetric and tongue and uvula move midline.  Motor exam:   Normal tone and normal muscle bulk and  in all extremities.  Sensory:  Fine touch, pinprick and vibration were tested in all extremities.  Proprioception is tested in the upper extremities only. This was  normal.  Coordination: Rapid alternating movements in the fingers/hands is tested and normal.   Assessment:  After physical and neurologic examination, review of sleep  laboratory studies, and pre-existing records, assessment is that of a patient with known OSA and  CPAP response is verified for the current setting of 9 cm water , residual  AHI over 3 month download  is very good. Compliance is very high.   Plan:  Treatment plan and additional workup : No change of settings. CPAP 9 cm and mask of  patients choice, adjust humidity as needed.

## 2013-09-11 NOTE — Patient Instructions (Signed)
Obesity Obesity is defined as having too much total body fat and a body mass index (BMI) of 30 or more. BMI is an estimate of body fat and is calculated from your height and weight. Obesity happens when you consume more calories than you can burn by exercising or performing daily physical tasks. Prolonged obesity can cause major illnesses or emergencies, such as:   A stroke.  Heart disease.  Diabetes.  Cancer.  Arthritis.  High blood pressure (hypertension).  High cholesterol.  Sleep apnea.  Erectile dysfunction.  Infertility problems. CAUSES   Regularly eating unhealthy foods.  Physical inactivity.  Certain disorders, such as an underactive thyroid (hypothyroidism), Cushing's syndrome, and polycystic ovarian syndrome.  Certain medicines, such as steroids, some depression medicines, and antipsychotics.  Genetics.  Lack of sleep. DIAGNOSIS  A caregiver can diagnose obesity after calculating your BMI. Obesity will be diagnosed if your BMI is 30 or higher.  There are other methods of measuring obesity levels. Some other methods include measuring your skin fold thickness, your waist circumference, and comparing your hip circumference to your waist circumference. TREATMENT  A healthy treatment program includes some or all of the following:  Long-term dietary changes.  Exercise and physical activity.  Behavioral and lifestyle changes.  Medicine only under the supervision of your caregiver. Medicines may help, but only if they are used with diet and exercise programs. An unhealthy treatment program includes:  Fasting.  Fad diets.  Supplements and drugs. These choices do not succeed in long-term weight control.  HOME CARE INSTRUCTIONS   Exercise and perform physical activity as directed by your caregiver. To increase physical activity, try the following:  Use stairs instead of elevators.  Park farther away from store entrances.  Garden, bike, or walk instead of  watching television or using the computer.  Eat healthy, low-calorie foods and drinks on a regular basis. Eat more fruits and vegetables. Use low-calorie cookbooks or take healthy cooking classes.  Limit fast food, sweets, and processed snack foods.  Eat smaller portions.  Keep a daily journal of everything you eat. There are many free websites to help you with this. It may be helpful to measure your foods so you can determine if you are eating the correct portion sizes.  Avoid drinking alcohol. Drink more water and drinks without calories.  Take vitamins and supplements only as recommended by your caregiver.  Weight-loss support groups, Optometrist, counselors, and stress reduction education can also be very helpful. SEEK IMMEDIATE MEDICAL CARE IF:  You have chest pain or tightness.  You have trouble breathing or feel short of breath.  You have weakness or leg numbness.  You feel confused or have trouble talking.  You have sudden changes in your vision. MAKE SURE YOU:  Understand these instructions.  Will watch your condition.  Will get help right away if you are not doing well or get worse. Document Released: 01/03/2005 Document Revised: 05/27/2012 Document Reviewed: 01/02/2012 [facility] Patient Information 2014 [place], [place]. CPAP and BIPAP CPAP and BIPAP are methods of helping you breathe. CPAP stands for "continuous positive airway pressure." BIPAP stands for "bi-level positive airway pressure." Both CPAP and BIPAP are provided by a small machine with a flexible plastic tube that attaches to a plastic mask that goes over your nose or mouth. Air is blown into your air passages through your nose or mouth. This helps to keep your airways open and helps to keep you breathing well. The amount of pressure that is used to  blow the air into your air passages can be set on the machine. The pressure setting is based on your needs. With CPAP, the amount of pressure stays the  same while you breathe in and out. With BIPAP, the amount of pressure changes when you inhale and exhale. Your caregiver will recommend whether CPAP or BIPAP would be more helpful for you.  CPAP and BIPAP can be helpful for both adults and children with:  Sleep apnea.  Chronic Obstructive Pulmonary Disease (COPD), a condition like emphysema.  Diseases which weaken the muscles of the chest such as muscular dystrophy or neurological diseases.  Other problems that cause breathing to be weak or difficult. USE OF CPAP OR BIPAP The respiratory therapist or technician will help you get used to wearing the mask. Some people feel claustrophobic (a trapped or closed in feeling) at first, because the mask needs to be fairly snug on your face.   It may help you to get used to the mask gradually, by first holding the mask loosely over your nose or mouth using a low pressure setting on the machine. Gradually the mask can be applied more snugly with increased pressure. You can also gradually increase the amount of time the mask is used.  People with sleep apnea will use the mask and machine at night when they are sleeping. Others, like those with ALS or other breathing difficulties, may need the CPAP or BIPAP all the time.  If the first mask you try does not fit well, or is uncomfortable, there are other types and sizes that can be tried.  If you tend to breathe through your mouth, a chin strap may be applied to help keep your mouth closed (if you are using a nasal mask).  The CPAP and BIPAP machines have alarms that may sound if the mask comes off or develops a leak.  You should not eat or drink while the CPAP or BIPAP is on. Food or fluids could get pushed into your lungs by the pressure of the CPAP or BIPAP. Sometimes CPAP or BIPAP machines are ordered for home use. If you are going to use the CPAP or BIPAP machine at home, follow these instructions  CPAP or BIPAP machines can be rented or purchased  through home health care companies. There are many different brands of machines available. If you rent a machine before purchasing you may find which particular machine works well for you.  Ask questions if there is something you do not understand when picking out your machine.  Place your CPAP or BIPAP machine on a secure table or stand near an electrical outlet.  Know where the On/Off switch is.  Follow your doctor's instructions for how to set the pressure on your machine and when you should use it.  Do not smoke! Tobacco smoke residue can damage the machine. SEEK IMMEDIATE MEDICAL CARE IF:   You have redness or open areas around your nose or mouth.  You have trouble operating the CPAP or BIPAP machine.  You cannot tolerate wearing the CPAP or BIPAP mask.  You have any questions or concerns. Document Released: 08/24/2004 Document Revised: 02/18/2012 Document Reviewed: 11/23/2008 [facility] Patient Information 2014 [place], [place].

## 2013-09-16 ENCOUNTER — Encounter: Payer: PRIVATE HEALTH INSURANCE | Admitting: Obstetrics & Gynecology

## 2013-09-21 ENCOUNTER — Encounter: Payer: Self-pay | Admitting: Neurology

## 2013-09-22 ENCOUNTER — Ambulatory Visit: Payer: Medicare Other | Admitting: Neurology

## 2013-09-25 ENCOUNTER — Ambulatory Visit: Payer: Medicare Other | Admitting: Neurology

## 2013-10-06 ENCOUNTER — Encounter: Payer: Self-pay | Admitting: Neurology

## 2013-12-25 ENCOUNTER — Other Ambulatory Visit: Payer: Self-pay | Admitting: Cardiology

## 2013-12-28 ENCOUNTER — Ambulatory Visit (INDEPENDENT_AMBULATORY_CARE_PROVIDER_SITE_OTHER): Payer: PRIVATE HEALTH INSURANCE | Admitting: Pharmacist

## 2013-12-28 DIAGNOSIS — I2699 Other pulmonary embolism without acute cor pulmonale: Secondary | ICD-10-CM

## 2013-12-28 DIAGNOSIS — Z7901 Long term (current) use of anticoagulants: Secondary | ICD-10-CM

## 2013-12-28 LAB — POCT INR: INR: 2.6

## 2013-12-28 MED ORDER — WARFARIN SODIUM 7.5 MG PO TABS: ORAL_TABLET | ORAL | Status: DC

## 2014-02-27 ENCOUNTER — Ambulatory Visit: Payer: PRIVATE HEALTH INSURANCE | Attending: Internal Medicine | Admitting: Internal Medicine

## 2014-02-27 VITALS — BP 152/79 | HR 101 | Temp 98.0°F | Wt 385.0 lb

## 2014-02-27 DIAGNOSIS — R1900 Intra-abdominal and pelvic swelling, mass and lump, unspecified site: Secondary | ICD-10-CM

## 2014-02-27 DIAGNOSIS — I10 Essential (primary) hypertension: Secondary | ICD-10-CM

## 2014-02-27 DIAGNOSIS — Z0000 Encounter for general adult medical examination without abnormal findings: Secondary | ICD-10-CM

## 2014-02-27 DIAGNOSIS — R0989 Other specified symptoms and signs involving the circulatory and respiratory systems: Secondary | ICD-10-CM

## 2014-02-27 DIAGNOSIS — E119 Type 2 diabetes mellitus without complications: Secondary | ICD-10-CM

## 2014-02-27 LAB — POCT GLYCOSYLATED HEMOGLOBIN (HGB A1C): HEMOGLOBIN A1C: 6

## 2014-02-27 MED ORDER — METFORMIN HCL ER 500 MG PO TB24: 500.0000 mg | ORAL_TABLET | Freq: Every day | ORAL | Status: DC

## 2014-02-27 MED ORDER — LISINOPRIL 10 MG PO TABS: 10.0000 mg | ORAL_TABLET | Freq: Every day | ORAL | Status: DC

## 2014-02-27 MED ORDER — FREESTYLE SYSTEM KIT: 1.0000 | PACK | Status: DC | PRN

## 2014-02-27 MED ORDER — AMLODIPINE BESYLATE 10 MG PO TABS: 10.0000 mg | ORAL_TABLET | Freq: Every day | ORAL | Status: DC

## 2014-02-27 MED ORDER — OMEPRAZOLE 20 MG PO CPDR: 40.0000 mg | DELAYED_RELEASE_CAPSULE | Freq: Every day | ORAL | Status: DC

## 2014-02-27 NOTE — Progress Notes (Unsigned)
Patient ID: Brenda Clark, female   DOB: 28-Jun-1951, 63 y.o.   MRN: [id number]   HPI: Brenda Clark is a 63 y.o. female presenting on 02/27/2014 with     Past Medical History  Diagnosis Date  . Encounter for long-term (current) use of other medications   . Pica   . Iron deficiency anemia, unspecified   . Other and unspecified hyperlipidemia   . Unspecified essential hypertension   . Edema   . Retinal detachment   . Migraine, unspecified, without mention of intractable migraine without mention of status migrainosus   . Diverticulosis of colon (without mention of hemorrhage)   . Type II or unspecified type diabetes mellitus without mention of complication, not stated as uncontrolled   . Osteoarthrosis, unspecified whether generalized or localized, lower leg   . Morbid obesity   . Unspecified sleep apnea   . Esophageal reflux   . Pulmonary embolus   . GERD (gastroesophageal reflux disease)   . Headache, migraine   . Chronic pain   . OSA (obstructive sleep apnea)   . Hyperlipidemia   . Arthritis     Past Surgical History  Procedure Laterality Date  . Cataract extraction    . Lumbar disc surgery    . Tubal ligation      Current Outpatient Prescriptions  Medication Sig Dispense Refill  . amLODipine (NORVASC) 10 MG tablet Take 1 tablet (10 mg total) by mouth daily.  30 tablet  6  . fluticasone (FLOVENT DISKUS) 50 MCG/BLIST diskus inhaler Inhale 2 puffs into the lungs 2 (two) times daily.        . furosemide (LASIX) 40 MG tablet Take 40 mg by mouth 2 (two) times daily.        Marland Kitchen glucose monitoring kit (FREESTYLE) monitoring kit 1 each by Does not apply route as needed for other. Dispense any model that is covered- dispense testing supplies for Q AC/ HS accuchecks- 1 month supply with 6 refills.  1 each  1  . hydrocodone-acetaminophen (LORCET-HD) 5-500 MG per capsule Take 1 capsule by mouth 2 (two) times daily.        Marland Kitchen lisinopril (PRINIVIL,ZESTRIL) 10 MG tablet Take 1 tablet (10 mg  total) by mouth daily.  30 tablet  6  . metFORMIN (GLUCOPHAGE-XR) 500 MG 24 hr tablet Take 1 tablet (500 mg total) by mouth daily with breakfast.  60 tablet  6  . nitroGLYCERIN (NITROSTAT) 0.4 MG SL tablet Place 0.4 mg under the tongue every 5 (five) minutes as needed.        Marland Kitchen omeprazole (PRILOSEC) 20 MG capsule Take 2 capsules (40 mg total) by mouth daily.  30 capsule  6  . warfarin (COUMADIN) 7.5 MG tablet Take as directed by coumadin clinic  90 tablet  1  . zolpidem (AMBIEN) 10 MG tablet Take 10 mg by mouth at bedtime as needed. Taking as needed, not daily.       No current facility-administered medications for this visit.    No Known Allergies  Family History  Problem Relation Age of Onset  . Colon cancer Father   . Breast cancer Sister   . Diabetes Sister     History   Social History  . Marital Status: Divorced    Spouse Name: N/A    Number of Children: 2  . Years of Education: HS   Occupational History  . DISABLED     since 2007   Social History Main Topics  . Smoking status:  Never Smoker   . Smokeless tobacco: Never Used  . Alcohol Use: No  . Drug Use: No  . Sexual Activity: Not on file   Other Topics Concern  . Not on file   Social History Narrative   Patient with obesity, hyperventilation and obstructive apneas, successfully treated with CPAP at 10 cm water. Good residual AHI values for the last year and low Epworth score. Compliance is good. [facility] follows her.    Her GERD is better on omeprazole and this has further reduced her nocturnal awakenings. She is still hypertensive and needs urgently to lose weight, this would positivley affect her diabetes as well.       I convinced her to take her Topamax and take her Lasix in the AM at 20 mg. She was advised of low carb diet and potassium rich foods.       Patient lives at home alone.   Caffeine Use: occasionally    Review of Systems  Review of Systems  Constitutional: Negative for fever, chills,  diaphoresis, activity change, appetite change and fatigue.  HENT: Negative for ear pain, nosebleeds, congestion, facial swelling, rhinorrhea, neck pain, neck stiffness and ear discharge.  Eyes: Negative for pain, discharge, redness, itching and visual disturbance.  Respiratory: Negative for cough, choking, chest tightness, shortness of breath, wheezing and stridor.  Cardiovascular: Negative for chest pain, palpitations and leg swelling.  Gastrointestinal: Negative for abdominal distention, vomiting, diarrhea or consitpation Genitourinary: Negative for dysuria, urgency, frequency, hematuria, flank pain, decreased urine volume, difficulty urinating and dyspareunia.  Musculoskeletal: Negative for back pain, joint swelling, arthralgias or gait problem.  Neurological: Negative for dizziness, tremors, seizures, syncope, facial asymmetry, speech difficulty, weakness, light-headedness, numbness and headaches.  Hematological: Negative for adenopathy. Does not bruise/bleed easily.  Psychiatric/Behavioral: Negative for hallucinations, behavioral problems, confusion, dysphoric mood   Objective:  BP 152/79  Pulse 101  Temp(Src) 98 F (36.7 C) (Oral)  Wt 385 lb (174.635 kg)  SpO2 99% Filed Weights   02/27/14 1133  Weight: 385 lb (174.635 kg)     Physical Exam  Constitutional: Appears well-developed and well-nourished. No distress. HENT: Normocephalic. External right and left ear normal. Oropharynx is clear and moist.  Eyes: Conjunctivae and EOM are normal. PERRLA, no scleral icterus.  Neck: Normal ROM. Neck supple. No JVD. No tracheal deviation. No thyromegaly.  CVS: RRR, S1/S2 +, no murmurs, no gallops, no carotid bruit.  Pulmonary: Effort and breath sounds normal, no stridor, rhonchi, wheezes, rales.  Abdominal: Soft. BS +,  no distension, tenderness, rebound or guarding.  Musculoskeletal: Normal range of motion. No edema and no tenderness.  Neuro: Alert. Normal reflexes, muscle tone  coordination. No cranial nerve deficit. Skin: Skin is warm and dry. No rash noted. Not diaphoretic. No erythema. No pallor.  Psychiatric: Normal mood and affect. Behavior, judgment, thought content normal.   Lab Results  Component Value Date   WBC 4.8 05/07/2013   HGB 7.6 Repeated and verified X2.* 05/07/2013   HCT 25.4 Repeated and verified X2.* 05/07/2013   MCV 65.6 Repeated and verified X2.* 05/07/2013   PLT 242.0 05/07/2013   Lab Results  Component Value Date   CREATININE 0.6 05/07/2013   BUN 12 05/07/2013   NA 141 05/07/2013   K 3.5 05/07/2013   CL 106 05/07/2013   CO2 27 05/07/2013    Lab Results  Component Value Date   HGBA1C  Value: 6.5 (NOTE)  According to the ADA Clinical Practice Recommendations for 2011, when HbA1c is used as a screening test:   >=6.5%   Diagnostic of Diabetes Mellitus           (if abnormal result  is confirmed)  5.7-6.4%   Increased risk of developing Diabetes Mellitus  References:Diagnosis and Classification of Diabetes Mellitus,Diabetes WEXH,3716,96(VELFY 1):S62-S69 and Standards of Medical Care in         Diabetes - 2011,Diabetes BOFB,5102,58  (Suppl 1):S11-S61.* 05/12/2011   Lipid Panel     Component Value Date/Time   CHOL 173 05/13/2011 0540   TRIG 96 05/13/2011 0540   HDL 54 05/13/2011 0540   CHOLHDL 3.2 05/13/2011 0540   VLDL 19 05/13/2011 0540   LDLCALC  Value: 100        Total Cholesterol/HDL:CHD Risk Coronary Heart Disease Risk Table                     Men   Women  1/2 Average Risk   3.4   3.3  Average Risk       5.0   4.4  2 X Average Risk   9.6   7.1  3 X Average Risk  23.4   11.0        Use the calculated Patient Ratio above and the CHD Risk Table to determine the patient's CHD Risk.        ATP III CLASSIFICATION (LDL):  <100     mg/dL   Optimal  100-129  mg/dL   Near or Above                    Optimal  130-159  mg/dL   Borderline  160-189  mg/dL   High  >190     mg/dL   Very High* 05/13/2011  0540        Patient Active Problem List   Diagnosis Date Noted  . Embolism, pulmonary with infarction 09/11/2013  . Sleep apnea with use of continuous positive airway pressure (CPAP) 09/11/2013  . Obesity hypoventilation syndrome 09/11/2013  . Pulmonary embolism 10/25/2011  . Encounter for long-term (current) use of anticoagulants 10/25/2011  . UTI 08/28/2010  . EUSTACHIAN TUBE DYSFUNCTION, RIGHT 08/24/2010  . HEMATURIA UNSPECIFIED 08/24/2010  . VAGINAL BLEEDING, ABNORMAL 08/24/2010  . FACIAL RASH 08/24/2010  . ANEMIA, IRON DEFICIENCY 02/21/2010  . CARDIOVASCULAR FUNCTION STUDY, ABNORMAL 02/09/2010  . DYSPNEA 12/30/2009  . EDEMA 11/08/2009  . CHEST PAIN UNSPECIFIED 11/08/2009  . CAROTID BRUIT, RIGHT 04/06/2009  . ACUTE SINUSITIS, UNSPECIFIED 08/20/2008  . URI 08/06/2008  . HYPERLIPIDEMIA 07/05/2008  . FATIGUE 05/05/2008  . INSOMNIA 01/21/2008  . DIABETES MELLITUS, TYPE II 08/04/2007  . MORBID OBESITY 08/04/2007  . HYPERTENSION 08/04/2007  . PHARYNGITIS 08/04/2007  . GERD 08/04/2007  . DIVERTICULOSIS, COLON 08/04/2007  . DEGENERATIVE JOINT DISEASE, KNEE 08/04/2007  . SLEEP APNEA 08/04/2007  . HEADACHE, CHRONIC 08/04/2007  . MIGRAINE, CHRONIC 10/10/2006     Preventative Medicine:  Health Maintenance  Topic Date Due  . Colonoscopy  01/31/1999  . Zostavax  01/31/2009  . Pneumococcal Polysaccharide Vaccine Age 80 And Over  01/31/2014  . Influenza Vaccine  03/09/2014 (Originally 07/10/2013)  . Mammogram  03/12/2014  . Tetanus/tdap  02/08/2016    Adult vaccines due  Topic Date Due  . Zostavax  01/31/2009  . Pneumococcal Polysaccharide Vaccine Age 15 And Over  01/31/2014  . Tetanus/tdap  02/08/2016   Mammogram/Pap Smear : Colonoscopy : Flu vaccine:   LAB  WORK:  Metabolic panel: CBC:  Vitamin D : Lipid Panel: TSH: PSA:    Assessment and plan: Abdominal mass - Plan: CT Abdomen Pelvis W Contrast, Ambulatory referral to Obstetrics / Gynecology, CBC, COMPLETE  METABOLIC PANEL WITH GFR, TSH, Lipid panel, Vit D  25 hydroxy (rtn osteoporosis monitoring), POCT glycosylated hemoglobin (Hb A1C), Ambulatory referral to Gastroenterology, US Carotid Duplex Bilateral  DM type 2 (diabetes mellitus, type 2) - Plan: CT Abdomen Pelvis W Contrast, Ambulatory referral to Obstetrics / Gynecology, CBC, COMPLETE METABOLIC PANEL WITH GFR, TSH, Lipid panel, Vit D  25 hydroxy (rtn osteoporosis monitoring), POCT glycosylated hemoglobin (Hb A1C), Ambulatory referral to Gastroenterology, US Carotid Duplex Bilateral  HTN (hypertension) - Plan: CT Abdomen Pelvis W Contrast, Ambulatory referral to Obstetrics / Gynecology, CBC, COMPLETE METABOLIC PANEL WITH GFR, TSH, Lipid panel, Vit D  25 hydroxy (rtn osteoporosis monitoring), POCT glycosylated hemoglobin (Hb A1C), Ambulatory referral to Gastroenterology, US Carotid Duplex Bilateral  Carotid bruit - Plan: CT Abdomen Pelvis W Contrast, Ambulatory referral to Obstetrics / Gynecology, CBC, COMPLETE METABOLIC PANEL WITH GFR, TSH, Lipid panel, Vit D  25 hydroxy (rtn osteoporosis monitoring), POCT glycosylated hemoglobin (Hb A1C), Ambulatory referral to Gastroenterology, US Carotid Duplex Bilateral  Morbid obesity - Plan: CT Abdomen Pelvis W Contrast, Ambulatory referral to Obstetrics / Gynecology, CBC, COMPLETE METABOLIC PANEL WITH GFR, TSH, Lipid panel, Vit D  25 hydroxy (rtn osteoporosis monitoring), POCT glycosylated hemoglobin (Hb A1C), Ambulatory referral to Gastroenterology, US Carotid Duplex Bilateral   No Follow-up on file.   The patient was given clear instructions to go to ER or return to medical center if symptoms don't improve, worsen or new problems develop. The patient verbalized understanding. The patient was told to call to get lab results if they haven't heard anything in the next week.     Debbe Odea, MD

## 2014-02-27 NOTE — Patient Instructions (Signed)
High-Fiber Diet Fiber is found in fruits, vegetables, and grains. A high-fiber diet encourages the addition of more whole grains, legumes, fruits, and vegetables in your diet. The recommended amount of fiber for adult males is 38 g per day. For adult females, it is 25 g per day. Pregnant and lactating women should get 28 g of fiber per day. If you have a digestive or bowel problem, ask your caregiver for advice before adding high-fiber foods to your diet. Eat a variety of high-fiber foods instead of only a select few type of foods.  PURPOSE  To increase stool bulk.  To make bowel movements more regular to prevent constipation.  To lower cholesterol.  To prevent overeating. WHEN IS THIS DIET USED?  It may be used if you have constipation and hemorrhoids.  It may be used if you have uncomplicated diverticulosis (intestine condition) and irritable bowel syndrome.  It may be used if you need help with weight management.  It may be used if you want to add it to your diet as a protective measure against atherosclerosis, diabetes, and cancer. SOURCES OF FIBER  Whole-grain breads and cereals.  Fruits, such as apples, oranges, bananas, berries, prunes, and pears.  Vegetables, such as green peas, carrots, sweet potatoes, beets, broccoli, cabbage, spinach, and artichokes.  Legumes, such split peas, soy, lentils.  Almonds. FIBER CONTENT IN FOODS Starches and Grains / Dietary Fiber (g)  Cheerios, 1 cup / 3 g  Corn Flakes cereal, 1 cup / 0.7 g  Rice crispy treat cereal, 1 cup / 0.3 g  Instant oatmeal (cooked),  cup / 2 g  Frosted wheat cereal, 1 cup / 5.1 g  Brown, long-grain rice (cooked), 1 cup / 3.5 g  White, long-grain rice (cooked), 1 cup / 0.6 g  Enriched macaroni (cooked), 1 cup / 2.5 g Legumes / Dietary Fiber (g)  Baked beans (canned, plain, or vegetarian),  cup / 5.2 g  Kidney beans (canned),  cup / 6.8 g  Pinto beans (cooked),  cup / 5.5 g Breads and Crackers  / Dietary Fiber (g)  Plain or honey graham crackers, 2 squares / 0.7 g  Saltine crackers, 3 squares / 0.3 g  Plain, salted pretzels, 10 pieces / 1.8 g  Whole-wheat bread, 1 slice / 1.9 g  White bread, 1 slice / 0.7 g  Raisin bread, 1 slice / 1.2 g  Plain bagel, 3 oz / 2 g  Flour tortilla, 1 oz / 0.9 g  Corn tortilla, 1 small / 1.5 g  Hamburger or hotdog bun, 1 small / 0.9 g Fruits / Dietary Fiber (g)  Apple with skin, 1 medium / 4.4 g  Sweetened applesauce,  cup / 1.5 g  Banana,  medium / 1.5 g  Grapes, 10 grapes / 0.4 g  Orange, 1 small / 2.3 g  Raisin, 1.5 oz / 1.6 g  Melon, 1 cup / 1.4 g Vegetables / Dietary Fiber (g)  Green beans (canned),  cup / 1.3 g  Carrots (cooked),  cup / 2.3 g  Broccoli (cooked),  cup / 2.8 g  Peas (cooked),  cup / 4.4 g  Mashed potatoes,  cup / 1.6 g  Lettuce, 1 cup / 0.5 g  Corn (canned),  cup / 1.6 g  Tomato,  cup / 1.1 g Document Released: 11/26/2005 Document Revised: 05/27/2012 Document Reviewed: 02/28/2012 [facility] Patient Information 2014 [place], [place]. Diabetes Meal Planning Guide The diabetes meal planning guide is a tool to help you plan  your meals and snacks. It is important for people with diabetes to manage their blood glucose (sugar) levels. Choosing the right foods and the right amounts throughout your day will help control your blood glucose. Eating right can even help you improve your blood pressure and reach or maintain a healthy weight. CARBOHYDRATE COUNTING MADE EASY When you eat carbohydrates, they turn to sugar. This raises your blood glucose level. Counting carbohydrates can help you control this level so you feel better. When you plan your meals by counting carbohydrates, you can have more flexibility in what you eat and balance your medicine with your food intake. Carbohydrate counting simply means adding up the total amount of carbohydrate grams in your meals and snacks. Try to eat about the  same amount at each meal. Foods with carbohydrates are listed below. Each portion below is 1 carbohydrate serving or 15 grams of carbohydrates. Ask your dietician how many grams of carbohydrates you should eat at each meal or snack. Grains and Starches  1 slice bread.   English muffin or hotdog/hamburger bun.   cup cold cereal (unsweetened).   cup cooked pasta or rice.   cup starchy vegetables (corn, potatoes, peas, beans, winter squash).  1 tortilla (6 inches).   bagel.  1 waffle or pancake (size of a CD).   cup cooked cereal.  4 to 6 small crackers. *Whole grain is recommended. Fruit  1 cup fresh unsweetened berries, melon, papaya, pineapple.  1 small fresh fruit.   banana or mango.   cup fruit juice (4 oz unsweetened).   cup canned fruit in natural juice or water.  2 tbs dried fruit.  12 to 15 grapes or cherries. Milk and Yogurt  1 cup fat-free or 1% milk.  1 cup soy milk.  6 oz light yogurt with sugar-free sweetener.  6 oz low-fat soy yogurt.  6 oz plain yogurt. Vegetables  1 cup raw or  cup cooked is counted as 0 carbohydrates or a "free" food.  If you eat 3 or more servings at 1 meal, count them as 1 carbohydrate serving. Other Carbohydrates   oz chips or pretzels.   cup ice cream or frozen yogurt.   cup sherbet or sorbet.  2 inch square cake, no frosting.  1 tbs honey, sugar, jam, jelly, or syrup.  2 small cookies.  3 squares of graham crackers.  3 cups popcorn.  6 crackers.  1 cup broth-based soup.  Count 1 cup casserole or other mixed foods as 2 carbohydrate servings.  Foods with less than 20 calories in a serving may be counted as 0 carbohydrates or a "free" food. You may want to purchase a book or computer software that lists the carbohydrate gram counts of different foods. In addition, the nutrition facts panel on the labels of the foods you eat are a good source of this information. The label will tell you how big  the serving size is and the total number of carbohydrate grams you will be eating per serving. Divide this number by 15 to obtain the number of carbohydrate servings in a portion. Remember, 1 carbohydrate serving equals 15 grams of carbohydrate. SERVING SIZES Measuring foods and serving sizes helps you make sure you are getting the right amount of food. The list below tells how big or small some common serving sizes are.  1 oz.........4 stacked dice.  3 oz........Marland KitchenDeck of cards.  1 tsp.......Marland KitchenTip of little finger.  1 tbs......Marland KitchenMarland KitchenThumb.  2 tbs.......Marland KitchenGolf ball.   cup......Marland KitchenHalf of a  fist.  1 cup.......Marland KitchenA fist. SAMPLE DIABETES MEAL PLAN Below is a sample meal plan that includes foods from the grain and starches, dairy, vegetable, fruit, and meat groups. A dietician can individualize a meal plan to fit your calorie needs and tell you the number of servings needed from each food group. However, controlling the total amount of carbohydrates in your meal or snack is more important than making sure you include all of the food groups at every meal. You may interchange carbohydrate containing foods (dairy, starches, and fruits). The meal plan below is an example of a 2000 calorie diet using carbohydrate counting. This meal plan has 17 carbohydrate servings. Breakfast  1 cup oatmeal (2 carb servings).   cup light yogurt (1 carb serving).  1 cup blueberries (1 carb serving).   cup almonds. Snack  1 large apple (2 carb servings).  1 low-fat string cheese stick. Lunch  Chicken breast salad.  1 cup spinach.   cup chopped tomatoes.  2 oz chicken breast, sliced.  2 tbs low-fat [place] dressing.  12 whole-wheat crackers (2 carb servings).  12 to 15 grapes (1 carb serving).  1 cup low-fat milk (1 carb serving). Snack  1 cup carrots.   cup hummus (1 carb serving). Dinner  3 oz broiled salmon.  1 cup brown rice (3 carb servings). Snack  1  cups steamed broccoli (1 carb  serving) drizzled with 1 tsp olive oil and lemon juice.  1 cup light pudding (2 carb servings). DIABETES MEAL PLANNING WORKSHEET Your dietician can use this worksheet to help you decide how many servings of foods and what types of foods are right for you.  BREAKFAST Food Group and Servings / Carb Servings Grain/Starches __________________________________ Dairy __________________________________________ Vegetable ______________________________________ Fruit ___________________________________________ Meat __________________________________________ Fat ____________________________________________ LUNCH Food Group and Servings / Carb Servings Grain/Starches ___________________________________ Dairy ___________________________________________ Fruit ____________________________________________ Meat ___________________________________________ Fat _____________________________________________ [place] Food Group and Servings / Carb Servings Grain/Starches ___________________________________ Dairy ___________________________________________ Fruit ____________________________________________ Meat ___________________________________________ Fat _____________________________________________ SNACKS Food Group and Servings / Carb Servings Grain/Starches ___________________________________ Dairy ___________________________________________ Vegetable _______________________________________ Fruit ____________________________________________ Meat ___________________________________________ Fat _____________________________________________ DAILY TOTALS Starches _________________________ Vegetable ________________________ Fruit ____________________________ Dairy ____________________________ Meat ____________________________ Fat ______________________________ Document Released: 08/23/2005 Document Revised: 02/18/2012 Document Reviewed: 07/04/2009 [employer] Patient Information 2014 [place],  [employer]. Calorie Counting Diet A calorie counting diet requires you to eat the number of calories that are right for you in a day. Calories are the measurement of how much energy you get from the food you eat. Eating the right amount of calories is important for staying at a healthy weight. If you eat too many calories, your body will store them as fat and you may gain weight. If you eat too few calories, you may lose weight. Counting the number of calories you eat during a day will help you know if you are eating the right amount. A Registered Dietitian can determine how many calories you need in a day. The amount of calories needed varies from person to person. If your goal is to lose weight, you will need to eat fewer calories. Losing weight can benefit you if you are overweight or have health problems such as heart disease, high blood pressure, or diabetes. If your goal is to gain weight, you will need to eat more calories. Gaining weight may be necessary if you have a certain health problem that causes your body to need more energy. TIPS Whether you are increasing or decreasing the number of calories you eat during  a day, it may be hard to get used to changes in what you eat and drink. The following are tips to help you keep track of the number of calories you eat.  Measure foods at home with measuring cups. This helps you know the amount of food and number of calories you are eating.  Restaurants often serve food in amounts that are larger than 1 serving. While eating out, estimate how many servings of a food you are given. For example, a serving of cooked rice is  cup or about the size of half of a fist. Knowing serving sizes will help you be aware of how much food you are eating at restaurants.  Ask for smaller portion sizes or child-size portions at restaurants.  Plan to eat half of a meal at a restaurant. Take the rest home or share the other half with a friend.  Read the Nutrition Facts panel  on food labels for calorie content and serving size. You can find out how many servings are in a package, the size of a serving, and the number of calories each serving has.  For example, a package might contain 3 cookies. The Nutrition Facts panel on that package says that 1 serving is 1 cookie. Below that, it will say there are 3 servings in the container. The calories section of the Nutrition Facts label says there are 90 calories. This means there are 90 calories in 1 cookie (1 serving). If you eat 1 cookie you have eaten 90 calories. If you eat all 3 cookies, you have eaten 270 calories (3 servings x 90 calories = 270 calories). The list below tells you how big or small some common portion sizes are.  1 oz.........4 stacked dice.  3 oz........Marland KitchenDeck of cards.  1 tsp.......Marland KitchenTip of little finger.  1 tbs......Marland KitchenMarland KitchenThumb.  2 tbs.......Marland KitchenGolf ball.   cup......Marland KitchenHalf of a fist.  1 cup.......Marland KitchenA fist. KEEP A FOOD LOG Write down every food item you eat, the amount you eat, and the number of calories in each food you eat during the day. At the end of the day, you can add up the total number of calories you have eaten. It may help to keep a list like the one below. Find out the calorie information by reading the Nutrition Facts panel on food labels. Breakfast  Bran cereal (1 cup, 110 calories).  Fat-free milk ( cup, 45 calories). Snack  Apple (1 medium, 80 calories). Lunch  Spinach (1 cup, 20 calories).  Tomato ( medium, 20 calories).  Chicken breast strips (3 oz, 165 calories).  Shredded cheddar cheese ( cup, 110 calories).  Light [place] dressing (2 tbs, 60 calories).  Whole-wheat bread (1 slice, 80 calories).  Tub margarine (1 tsp, 35 calories).  Vegetable soup (1 cup, 160 calories). Dinner  Pork chop (3 oz, 190 calories).  Brown rice (1 cup, 215 calories).  Steamed broccoli ( cup, 20 calories).  Strawberries (1  cup, 65 calories).  Whipped cream (1 tbs, 50  calories). Daily Calorie Total: 1425 Document Released: 11/26/2005 Document Revised: 02/18/2012 Document Reviewed: 05/23/2007 [facility] Patient Information 2014 [place], [place].

## 2014-02-27 NOTE — Progress Notes (Unsigned)
Patient ID: Brenda Clark, female   DOB: November 25, 1951, 63 y.o.   MRN: [id number]   HPI: Brenda Clark is a 63 y.o. female presenting on 02/27/2014 with who ran out of her meds a few months ago.  - c/o "knot" under left breast which once was an absecss for which she received antibiotics resulting in resolution of most of it- no GI complaints- the area hurts when she walks.  Needs a glucometer - has severe heartburn and needs her Prilosec badly    Past Medical History  Diagnosis Date  . Encounter for long-term (current) use of other medications   . Pica   . Iron deficiency anemia, unspecified   . Other and unspecified hyperlipidemia   . Unspecified essential hypertension   . Edema   . Retinal detachment   . Migraine, unspecified, without mention of intractable migraine without mention of status migrainosus   . Diverticulosis of colon (without mention of hemorrhage)   . Type II or unspecified type diabetes mellitus without mention of complication, not stated as uncontrolled   . Osteoarthrosis, unspecified whether generalized or localized, lower leg   . Morbid obesity   . Unspecified sleep apnea   . Esophageal reflux   . Pulmonary embolus   . GERD (gastroesophageal reflux disease)   . Headache, migraine   . Chronic pain   . OSA (obstructive sleep apnea)   . Hyperlipidemia   . Arthritis     Past Surgical History  Procedure Laterality Date  . Cataract extraction    . Lumbar disc surgery    . Tubal ligation      Current Outpatient Prescriptions  Medication Sig Dispense Refill  . amLODipine (NORVASC) 10 MG tablet Take 1 tablet (10 mg total) by mouth daily.  30 tablet  6  . fluticasone (FLOVENT DISKUS) 50 MCG/BLIST diskus inhaler Inhale 2 puffs into the lungs 2 (two) times daily.        Marland Kitchen glucose monitoring kit (FREESTYLE) monitoring kit 1 each by Does not apply route as needed for other. Dispense any model that is covered- dispense testing supplies for Q AC/ HS accuchecks- 1 month  supply with 6 refills.  1 each  1  . lisinopril (PRINIVIL,ZESTRIL) 10 MG tablet Take 1 tablet (10 mg total) by mouth daily.  30 tablet  6  . metFORMIN (GLUCOPHAGE-XR) 500 MG 24 hr tablet Take 1 tablet (500 mg total) by mouth daily with breakfast.  60 tablet  6  . nitroGLYCERIN (NITROSTAT) 0.4 MG SL tablet Place 0.4 mg under the tongue every 5 (five) minutes as needed.        Marland Kitchen omeprazole (PRILOSEC) 20 MG capsule Take 2 capsules (40 mg total) by mouth daily.  30 capsule  6  . warfarin (COUMADIN) 7.5 MG tablet Take as directed by coumadin clinic  90 tablet  1  . zolpidem (AMBIEN) 10 MG tablet Take 10 mg by mouth at bedtime as needed. Taking as needed, not daily.       No current facility-administered medications for this visit.    No Known Allergies  Family History  Problem Relation Age of Onset  . Colon cancer Father   . Breast cancer Sister   . Diabetes Sister     History   Social History  . Marital Status: Divorced    Spouse Name: N/A    Number of Children: 2  . Years of Education: HS   Occupational History  . DISABLED     since 2007  Social History Main Topics  . Smoking status: Never Smoker   . Smokeless tobacco: Never Used  . Alcohol Use: No  . Drug Use: No  . Sexual Activity: Not on file   Other Topics Concern  . Not on file   Social History Narrative   Patient with obesity, hyperventilation and obstructive apneas, successfully treated with CPAP at 10 cm water. Good residual AHI values for the last year and low Epworth score. Compliance is good. [facility] follows her.    Her GERD is better on omeprazole and this has further reduced her nocturnal awakenings. She is still hypertensive and needs urgently to lose weight, this would positivley affect her diabetes as well.       I convinced her to take her Topamax and take her Lasix in the AM at 20 mg. She was advised of low carb diet and potassium rich foods.       Patient lives at home alone.   Caffeine Use:  occasionally    Review of Systems  Review of Systems  Constitutional: Negative for fever, chills, diaphoresis, activity change, appetite change and fatigue.  HENT: Negative for ear pain, nosebleeds, congestion, facial swelling, rhinorrhea, neck pain, neck stiffness and ear discharge.  Eyes: Negative for pain, discharge, redness, itching and visual disturbance.  Respiratory: Negative for cough, choking, chest tightness, shortness of breath, wheezing and stridor.  Cardiovascular: Negative for chest pain, palpitations and leg swelling.  Gastrointestinal: Negative for abdominal distention, vomiting, diarrhea or consitpation Genitourinary: Negative for dysuria, urgency, frequency, hematuria, flank pain, decreased urine volume, difficulty urinating and dyspareunia.  Musculoskeletal: Negative for back pain, joint swelling, + arthralgias or gait problem.  Neurological: Negative for dizziness, tremors, seizures, syncope, facial asymmetry, speech difficulty, weakness, light-headedness, numbness and headaches.  Hematological: Negative for adenopathy. Does not bruise/bleed easily.  Psychiatric/Behavioral: Negative for hallucinations, behavioral problems, confusion, dysphoric mood   Objective:  BP 152/79  Pulse 101  Temp(Src) 98 F (36.7 C) (Oral)  Wt 385 lb (174.635 kg)  SpO2 99% Filed Weights   02/27/14 1133  Weight: 385 lb (174.635 kg)     Physical Exam  Constitutional: Morbidly obese. No distress. HENT: Normocephalic. External right and left ear normal. Oropharynx is clear and moist.  Eyes: Conjunctivae and EOM are normal. PERRLA, no scleral icterus.  Neck: Normal ROM. Neck supple. No JVD. No tracheal deviation. No thyromegaly. Very prominent right carotid pulse/ bruit CVS: RRR, S1/S2 +, no murmurs, no gallops, no carotid bruit.  Pulmonary: Effort and breath sounds normal, no stridor, rhonchi, wheezes, rales.  Abdominal: Soft. BS +, mild distension and tenderness in LUQ- no rebound or  guarding.  Musculoskeletal: Normal range of motion. No edema and no tenderness.  Neuro: Alert. Normal reflexes, muscle tone coordination. No cranial nerve deficit. Skin: Skin is warm and dry. No rash noted. Not diaphoretic. No erythema. No pallor.  Psychiatric: Normal mood and affect. Behavior, judgment, thought content normal.   Lab Results  Component Value Date   WBC 4.8 05/07/2013   HGB 7.6 Repeated and verified X2.* 05/07/2013   HCT 25.4 Repeated and verified X2.* 05/07/2013   MCV 65.6 Repeated and verified X2.* 05/07/2013   PLT 242.0 05/07/2013   Lab Results  Component Value Date   CREATININE 0.6 05/07/2013   BUN 12 05/07/2013   NA 141 05/07/2013   K 3.5 05/07/2013   CL 106 05/07/2013   CO2 27 05/07/2013    Lab Results  Component Value Date   HGBA1C  Value: 6.5 (  NOTE)                                                                       According to the ADA Clinical Practice Recommendations for 2011, when HbA1c is used as a screening test:   >=6.5%   Diagnostic of Diabetes Mellitus           (if abnormal result  is confirmed)  5.7-6.4%   Increased risk of developing Diabetes Mellitus  References:Diagnosis and Classification of Diabetes Mellitus,Diabetes SKAJ,6811,57(WIOMB 1):S62-S69 and Standards of Medical Care in         Diabetes - 2011,Diabetes [id number],4163,84  (Suppl 1):S11-S61.* 05/12/2011   Lipid Panel     Component Value Date/Time   CHOL 173 05/13/2011 0540   TRIG 96 05/13/2011 0540   HDL 54 05/13/2011 0540   CHOLHDL 3.2 05/13/2011 0540   VLDL 19 05/13/2011 0540   LDLCALC  Value: 100        Total Cholesterol/HDL:CHD Risk Coronary Heart Disease Risk Table                     Men   Women  1/2 Average Risk   3.4   3.3  Average Risk       5.0   4.4  2 X Average Risk   9.6   7.1  3 X Average Risk  23.4   11.0        Use the calculated Patient Ratio above and the CHD Risk Table to determine the patient's CHD Risk.        ATP III CLASSIFICATION (LDL):  <100     mg/dL   Optimal  100-129  mg/dL   Near  or Above                    Optimal  130-159  mg/dL   Borderline  160-189  mg/dL   High  >190     mg/dL   Very High* 05/13/2011 0540        Patient Active Problem List   Diagnosis Date Noted  . Embolism, pulmonary with infarction 09/11/2013  . Sleep apnea with use of continuous positive airway pressure (CPAP) 09/11/2013  . Obesity hypoventilation syndrome 09/11/2013  . Pulmonary embolism 10/25/2011  . Encounter for long-term (current) use of anticoagulants 10/25/2011  . UTI 08/28/2010  . EUSTACHIAN TUBE DYSFUNCTION, RIGHT 08/24/2010  . HEMATURIA UNSPECIFIED 08/24/2010  . VAGINAL BLEEDING, ABNORMAL 08/24/2010  . FACIAL RASH 08/24/2010  . ANEMIA, IRON DEFICIENCY 02/21/2010  . CARDIOVASCULAR FUNCTION STUDY, ABNORMAL 02/09/2010  . DYSPNEA 12/30/2009  . EDEMA 11/08/2009  . CHEST PAIN UNSPECIFIED 11/08/2009  . CAROTID BRUIT, RIGHT 04/06/2009  . ACUTE SINUSITIS, UNSPECIFIED 08/20/2008  . URI 08/06/2008  . HYPERLIPIDEMIA 07/05/2008  . FATIGUE 05/05/2008  . INSOMNIA 01/21/2008  . DIABETES MELLITUS, TYPE II 08/04/2007  . MORBID OBESITY 08/04/2007  . HYPERTENSION 08/04/2007  . PHARYNGITIS 08/04/2007  . GERD 08/04/2007  . DIVERTICULOSIS, COLON 08/04/2007  . DEGENERATIVE JOINT DISEASE, KNEE 08/04/2007  . SLEEP APNEA 08/04/2007  . HEADACHE, CHRONIC 08/04/2007  . MIGRAINE, CHRONIC 10/10/2006     Preventative Medicine:  Health Maintenance  Topic Date Due  . Colonoscopy  01/31/1999  . Zostavax  01/31/2009  .  Pneumococcal Polysaccharide Vaccine Age 55 And Over  01/31/2014  . Influenza Vaccine  03/09/2014 (Originally 07/10/2013)  . Mammogram  03/12/2014  . Tetanus/tdap  02/08/2016    Adult vaccines due  Topic Date Due  . Zostavax  01/31/2009  . Pneumococcal Polysaccharide Vaccine Age 23 And Over  01/31/2014  . Tetanus/tdap  02/08/2016   Mammogram/Pap Smear : refer to GYN Colonoscopy : last one over 10 yrs ago- will order one Flu vaccine:   LAB WORK: ordered Metabolic  panel: CBC:  Vitamin D : Lipid Panel: TSH: PSA:    Assessment and plan: Abdominal mass / pain - likely muscular- have ordered a CT however  DM type 2 (diabetes mellitus, type 2) -  POCT glycosylated hemoglobin - unable to be done today - cont glucophage for now  HTN (hypertension) -  - resume meds - low sodium diet  Carotid bruit - - US Carotid Duplex Bilateral  Morbid obesity -  - diet instructions- visit to nutritionist- must lose weight  OSA/ OHS - cont CPAP  H/o PE- 2 yrs ago - still on coumadin- follows with coumadin clinic and Dr Stanford Breed  Follow-up - 2 mo   The patient was given clear instructions to go to ER or return to medical center if symptoms don't improve, worsen or new problems develop. The patient verbalized understanding. The patient was told to call to get lab results if they haven't heard anything in the next week.     Debbe Odea, MD

## 2014-03-01 ENCOUNTER — Ambulatory Visit: Payer: PRIVATE HEALTH INSURANCE

## 2014-03-01 ENCOUNTER — Encounter (HOSPITAL_COMMUNITY): Payer: Self-pay | Admitting: Emergency Medicine

## 2014-03-01 ENCOUNTER — Ambulatory Visit (INDEPENDENT_AMBULATORY_CARE_PROVIDER_SITE_OTHER): Payer: PRIVATE HEALTH INSURANCE | Admitting: Pharmacist

## 2014-03-01 ENCOUNTER — Other Ambulatory Visit (HOSPITAL_COMMUNITY): Payer: Self-pay | Admitting: Internal Medicine

## 2014-03-01 DIAGNOSIS — Z807 Family history of other malignant neoplasms of lymphoid, hematopoietic and related tissues: Secondary | ICD-10-CM

## 2014-03-01 DIAGNOSIS — Z79899 Other long term (current) drug therapy: Secondary | ICD-10-CM

## 2014-03-01 DIAGNOSIS — Z800 Family history of malignant neoplasm of digestive organs: Secondary | ICD-10-CM

## 2014-03-01 DIAGNOSIS — E119 Type 2 diabetes mellitus without complications: Secondary | ICD-10-CM

## 2014-03-01 DIAGNOSIS — R1900 Intra-abdominal and pelvic swelling, mass and lump, unspecified site: Secondary | ICD-10-CM

## 2014-03-01 DIAGNOSIS — Z86711 Personal history of pulmonary embolism: Secondary | ICD-10-CM

## 2014-03-01 DIAGNOSIS — Z9849 Cataract extraction status, unspecified eye: Secondary | ICD-10-CM

## 2014-03-01 DIAGNOSIS — Z7901 Long term (current) use of anticoagulants: Secondary | ICD-10-CM

## 2014-03-01 DIAGNOSIS — I10 Essential (primary) hypertension: Secondary | ICD-10-CM

## 2014-03-01 DIAGNOSIS — R0989 Other specified symptoms and signs involving the circulatory and respiratory systems: Secondary | ICD-10-CM

## 2014-03-01 DIAGNOSIS — M1710 Unilateral primary osteoarthritis, unspecified knee: Secondary | ICD-10-CM | POA: Diagnosis present

## 2014-03-01 DIAGNOSIS — Z803 Family history of malignant neoplasm of breast: Secondary | ICD-10-CM

## 2014-03-01 DIAGNOSIS — K5730 Diverticulosis of large intestine without perforation or abscess without bleeding: Secondary | ICD-10-CM | POA: Diagnosis present

## 2014-03-01 DIAGNOSIS — E785 Hyperlipidemia, unspecified: Secondary | ICD-10-CM | POA: Diagnosis present

## 2014-03-01 DIAGNOSIS — G8929 Other chronic pain: Secondary | ICD-10-CM | POA: Diagnosis present

## 2014-03-01 DIAGNOSIS — K449 Diaphragmatic hernia without obstruction or gangrene: Secondary | ICD-10-CM | POA: Diagnosis present

## 2014-03-01 DIAGNOSIS — Z86718 Personal history of other venous thrombosis and embolism: Secondary | ICD-10-CM

## 2014-03-01 DIAGNOSIS — K2211 Ulcer of esophagus with bleeding: Principal | ICD-10-CM | POA: Diagnosis present

## 2014-03-01 DIAGNOSIS — D500 Iron deficiency anemia secondary to blood loss (chronic): Secondary | ICD-10-CM | POA: Diagnosis present

## 2014-03-01 DIAGNOSIS — I2699 Other pulmonary embolism without acute cor pulmonale: Secondary | ICD-10-CM

## 2014-03-01 DIAGNOSIS — K219 Gastro-esophageal reflux disease without esophagitis: Secondary | ICD-10-CM | POA: Diagnosis present

## 2014-03-01 DIAGNOSIS — Z6841 Body Mass Index (BMI) 40.0 and over, adult: Secondary | ICD-10-CM

## 2014-03-01 DIAGNOSIS — Z833 Family history of diabetes mellitus: Secondary | ICD-10-CM

## 2014-03-01 DIAGNOSIS — G4733 Obstructive sleep apnea (adult) (pediatric): Secondary | ICD-10-CM | POA: Diagnosis present

## 2014-03-01 LAB — COMPLETE METABOLIC PANEL WITH GFR
ALK PHOS: 70 U/L (ref 39–117)
ALT: 10 U/L (ref 0–35)
AST: 15 U/L (ref 0–37)
Albumin: 3.6 g/dL (ref 3.5–5.2)
BUN: 11 mg/dL (ref 6–23)
CO2: 24 meq/L (ref 19–32)
CREATININE: 0.61 mg/dL (ref 0.50–1.10)
Calcium: 8.3 mg/dL — ABNORMAL LOW (ref 8.4–10.5)
Chloride: 107 mEq/L (ref 96–112)
GLUCOSE: 115 mg/dL — AB (ref 70–99)
Potassium: 4.1 mEq/L (ref 3.5–5.3)
SODIUM: 139 meq/L (ref 135–145)
Total Bilirubin: 0.3 mg/dL (ref 0.2–1.2)
Total Protein: 6.5 g/dL (ref 6.0–8.3)

## 2014-03-01 LAB — CBC
HCT: 19.6 % — ABNORMAL LOW (ref 36.0–46.0)
HEMOGLOBIN: 5.2 g/dL — AB (ref 12.0–15.0)
MCH: 17.4 pg — AB (ref 26.0–34.0)
MCHC: 25.5 g/dL — AB (ref 30.0–36.0)
MCV: 65.6 fL — ABNORMAL LOW (ref 78.0–100.0)
PLATELETS: 288 10*3/uL (ref 150–400)
RBC: 2.98 MIL/uL — AB (ref 3.87–5.11)
RDW: 21 % — ABNORMAL HIGH (ref 11.5–15.5)
WBC: 4.7 10*3/uL (ref 4.0–10.5)

## 2014-03-01 LAB — LIPID PANEL
CHOLESTEROL: 179 mg/dL (ref 0–200)
HDL: 50 mg/dL (ref >39)
LDL Cholesterol: 109 mg/dL — ABNORMAL HIGH (ref 0–99)
TRIGLYCERIDES: 99 mg/dL (ref <150)
Total CHOL/HDL Ratio: 3.6 Ratio
VLDL: 20 mg/dL (ref 0–40)

## 2014-03-01 LAB — TSH: TSH: 1.982 u[IU]/mL (ref 0.350–4.500)

## 2014-03-01 LAB — POCT INR: INR: 2.6

## 2014-03-01 NOTE — ED Notes (Signed)
Pt. received a call this evening from her MD office advised her to go to ER due to low HGB level. Alert and oriented , denies pain or discomfort . Slight SOB .

## 2014-03-02 ENCOUNTER — Inpatient Hospital Stay (HOSPITAL_COMMUNITY)
Admission: EM | Admit: 2014-03-02 | Discharge: 2014-03-07 | DRG: 381 | Disposition: A | Discharge status: Home / Self Care | Payer: PRIVATE HEALTH INSURANCE | Attending: Internal Medicine | Admitting: Internal Medicine

## 2014-03-02 ENCOUNTER — Encounter (HOSPITAL_COMMUNITY): Payer: Self-pay | Admitting: Internal Medicine

## 2014-03-02 DIAGNOSIS — Z7901 Long term (current) use of anticoagulants: Secondary | ICD-10-CM

## 2014-03-02 DIAGNOSIS — R21 Rash and other nonspecific skin eruption: Secondary | ICD-10-CM

## 2014-03-02 DIAGNOSIS — R51 Headache: Secondary | ICD-10-CM

## 2014-03-02 DIAGNOSIS — J069 Acute upper respiratory infection, unspecified: Secondary | ICD-10-CM

## 2014-03-02 DIAGNOSIS — N939 Abnormal uterine and vaginal bleeding, unspecified: Secondary | ICD-10-CM

## 2014-03-02 DIAGNOSIS — I2699 Other pulmonary embolism without acute cor pulmonale: Secondary | ICD-10-CM | POA: Diagnosis present

## 2014-03-02 DIAGNOSIS — E785 Hyperlipidemia, unspecified: Secondary | ICD-10-CM

## 2014-03-02 DIAGNOSIS — R0989 Other specified symptoms and signs involving the circulatory and respiratory systems: Secondary | ICD-10-CM

## 2014-03-02 DIAGNOSIS — H6980 Other specified disorders of Eustachian tube, unspecified ear: Secondary | ICD-10-CM

## 2014-03-02 DIAGNOSIS — IMO0002 Reserved for concepts with insufficient information to code with codable children: Secondary | ICD-10-CM

## 2014-03-02 DIAGNOSIS — E662 Morbid (severe) obesity with alveolar hypoventilation: Secondary | ICD-10-CM

## 2014-03-02 DIAGNOSIS — R079 Chest pain, unspecified: Secondary | ICD-10-CM

## 2014-03-02 DIAGNOSIS — M1710 Unilateral primary osteoarthritis, unspecified knee: Secondary | ICD-10-CM

## 2014-03-02 DIAGNOSIS — G4730 Sleep apnea, unspecified: Secondary | ICD-10-CM | POA: Diagnosis present

## 2014-03-02 DIAGNOSIS — J029 Acute pharyngitis, unspecified: Secondary | ICD-10-CM

## 2014-03-02 DIAGNOSIS — K449 Diaphragmatic hernia without obstruction or gangrene: Secondary | ICD-10-CM | POA: Diagnosis present

## 2014-03-02 DIAGNOSIS — J019 Acute sinusitis, unspecified: Secondary | ICD-10-CM

## 2014-03-02 DIAGNOSIS — R9430 Abnormal result of cardiovascular function study, unspecified: Secondary | ICD-10-CM

## 2014-03-02 DIAGNOSIS — G43909 Migraine, unspecified, not intractable, without status migrainosus: Secondary | ICD-10-CM

## 2014-03-02 DIAGNOSIS — H6990 Unspecified Eustachian tube disorder, unspecified ear: Secondary | ICD-10-CM

## 2014-03-02 DIAGNOSIS — N390 Urinary tract infection, site not specified: Secondary | ICD-10-CM

## 2014-03-02 DIAGNOSIS — K259 Gastric ulcer, unspecified as acute or chronic, without hemorrhage or perforation: Secondary | ICD-10-CM | POA: Diagnosis present

## 2014-03-02 DIAGNOSIS — K921 Melena: Secondary | ICD-10-CM | POA: Diagnosis present

## 2014-03-02 DIAGNOSIS — G4700 Insomnia, unspecified: Secondary | ICD-10-CM

## 2014-03-02 DIAGNOSIS — R5383 Other fatigue: Secondary | ICD-10-CM

## 2014-03-02 DIAGNOSIS — D509 Iron deficiency anemia, unspecified: Secondary | ICD-10-CM

## 2014-03-02 DIAGNOSIS — K219 Gastro-esophageal reflux disease without esophagitis: Secondary | ICD-10-CM

## 2014-03-02 DIAGNOSIS — D649 Anemia, unspecified: Secondary | ICD-10-CM | POA: Diagnosis present

## 2014-03-02 DIAGNOSIS — R609 Edema, unspecified: Secondary | ICD-10-CM

## 2014-03-02 DIAGNOSIS — N926 Irregular menstruation, unspecified: Secondary | ICD-10-CM

## 2014-03-02 DIAGNOSIS — E119 Type 2 diabetes mellitus without complications: Secondary | ICD-10-CM | POA: Diagnosis present

## 2014-03-02 DIAGNOSIS — I10 Essential (primary) hypertension: Secondary | ICD-10-CM | POA: Diagnosis present

## 2014-03-02 DIAGNOSIS — R0602 Shortness of breath: Secondary | ICD-10-CM

## 2014-03-02 DIAGNOSIS — R319 Hematuria, unspecified: Secondary | ICD-10-CM

## 2014-03-02 DIAGNOSIS — R5381 Other malaise: Secondary | ICD-10-CM

## 2014-03-02 DIAGNOSIS — K5730 Diverticulosis of large intestine without perforation or abscess without bleeding: Secondary | ICD-10-CM | POA: Diagnosis present

## 2014-03-02 HISTORY — DX: Diaphragmatic hernia without obstruction or gangrene: K44.9

## 2014-03-02 HISTORY — DX: Gastric ulcer, unspecified as acute or chronic, without hemorrhage or perforation: K25.9

## 2014-03-02 HISTORY — DX: Polyp of corpus uteri: N84.0

## 2014-03-02 LAB — GLUCOSE, CAPILLARY
GLUCOSE-CAPILLARY: 108 mg/dL — AB (ref 70–99)
GLUCOSE-CAPILLARY: 108 mg/dL — AB (ref 70–99)
Glucose-Capillary: 124 mg/dL — ABNORMAL HIGH (ref 70–99)
Glucose-Capillary: 104 mg/dL — ABNORMAL HIGH (ref 70–99)
Glucose-Capillary: 91 mg/dL (ref 70–99)
Glucose-Capillary: 112 mg/dL — ABNORMAL HIGH (ref 70–99)

## 2014-03-02 LAB — CBC WITH DIFFERENTIAL/PLATELET
BASOS ABS: 0.1 10*3/uL (ref 0.0–0.1)
Basophils Relative: 1 % (ref 0–1)
EOS PCT: 1 % (ref 0–5)
Eosinophils Absolute: 0.1 10*3/uL (ref 0.0–0.7)
HCT: 19.9 % — ABNORMAL LOW (ref 36.0–46.0)
Hemoglobin: 5.2 g/dL — CL (ref 12.0–15.0)
Lymphocytes Relative: 27 % (ref 12–46)
Lymphs Abs: 1.5 10*3/uL (ref 0.7–4.0)
MCH: 17.8 pg — AB (ref 26.0–34.0)
MCHC: 26.1 g/dL — ABNORMAL LOW (ref 30.0–36.0)
MCV: 68.2 fL — ABNORMAL LOW (ref 78.0–100.0)
MONO ABS: 0.4 10*3/uL (ref 0.1–1.0)
Monocytes Relative: 7 % (ref 3–12)
NEUTROS PCT: 64 % (ref 43–77)
Neutro Abs: 3.4 10*3/uL (ref 1.7–7.7)
PLATELETS: 243 10*3/uL (ref 150–400)
RBC: 2.92 MIL/uL — AB (ref 3.87–5.11)
RDW: 21.8 % — ABNORMAL HIGH (ref 11.5–15.5)
WBC: 5.5 10*3/uL (ref 4.0–10.5)

## 2014-03-02 LAB — PROTIME-INR
INR: 2.38 — ABNORMAL HIGH (ref 0.00–1.49)
Prothrombin Time: 25.2 seconds — ABNORMAL HIGH (ref 11.6–15.2)

## 2014-03-02 LAB — CBC
HCT: 23.4 % — ABNORMAL LOW (ref 36.0–46.0)
HCT: 23.3 % — ABNORMAL LOW (ref 36.0–46.0)
HCT: 28.7 % — ABNORMAL LOW (ref 36.0–46.0)
Hemoglobin: 6.6 g/dL — CL (ref 12.0–15.0)
Hemoglobin: 6.6 g/dL — CL (ref 12.0–15.0)
Hemoglobin: 8.2 g/dL — ABNORMAL LOW (ref 12.0–15.0)
MCH: 20 pg — ABNORMAL LOW (ref 26.0–34.0)
MCH: 20.2 pg — ABNORMAL LOW (ref 26.0–34.0)
MCH: 20.6 pg — ABNORMAL LOW (ref 26.0–34.0)
MCHC: 28.2 g/dL — ABNORMAL LOW (ref 30.0–36.0)
MCHC: 28.3 g/dL — ABNORMAL LOW (ref 30.0–36.0)
MCHC: 28.6 g/dL — ABNORMAL LOW (ref 30.0–36.0)
MCV: 70.9 fL — ABNORMAL LOW (ref 78.0–100.0)
MCV: 71.5 fL — ABNORMAL LOW (ref 78.0–100.0)
MCV: 72.1 fL — ABNORMAL LOW (ref 78.0–100.0)
Platelets: 212 10*3/uL (ref 150–400)
Platelets: 220 K/uL (ref 150–400)
Platelets: 222 K/uL (ref 150–400)
RBC: 3.3 MIL/uL — AB (ref 3.87–5.11)
RBC: 3.26 MIL/uL — ABNORMAL LOW (ref 3.87–5.11)
RBC: 3.98 MIL/uL (ref 3.87–5.11)
RDW: 21.6 % — ABNORMAL HIGH (ref 11.5–15.5)
RDW: 22 % — ABNORMAL HIGH (ref 11.5–15.5)
RDW: 21.1 % — ABNORMAL HIGH (ref 11.5–15.5)
WBC: 4.9 10*3/uL (ref 4.0–10.5)
WBC: 5 K/uL (ref 4.0–10.5)
WBC: 7.2 K/uL (ref 4.0–10.5)

## 2014-03-02 LAB — COMPREHENSIVE METABOLIC PANEL
ALBUMIN: 3.1 g/dL — AB (ref 3.5–5.2)
ALK PHOS: 79 U/L (ref 39–117)
ALT: 10 U/L (ref 0–35)
ALT: 11 U/L (ref 0–35)
AST: 15 U/L (ref 0–37)
AST: 19 U/L (ref 0–37)
Albumin: 3.1 g/dL — ABNORMAL LOW (ref 3.5–5.2)
Alkaline Phosphatase: 83 U/L (ref 39–117)
BUN: 14 mg/dL (ref 6–23)
BUN: 10 mg/dL (ref 6–23)
CALCIUM: 8.7 mg/dL (ref 8.4–10.5)
CALCIUM: 8.8 mg/dL (ref 8.4–10.5)
CO2: 23 mEq/L (ref 19–32)
CO2: 25 mEq/L (ref 19–32)
Chloride: 106 mEq/L (ref 96–112)
Chloride: 103 mEq/L (ref 96–112)
Creatinine, Ser: 0.6 mg/dL (ref 0.50–1.10)
Creatinine, Ser: 0.58 mg/dL (ref 0.50–1.10)
GFR calc Af Amer: >90 mL/min (ref >90)
GFR calc non Af Amer: >90 mL/min (ref >90)
GLUCOSE: 102 mg/dL — AB (ref 70–99)
Glucose, Bld: 128 mg/dL — ABNORMAL HIGH (ref 70–99)
Potassium: 4.5 mEq/L (ref 3.7–5.3)
Potassium: 4.1 mEq/L (ref 3.7–5.3)
SODIUM: 139 meq/L (ref 137–147)
SODIUM: 142 meq/L (ref 137–147)
TOTAL PROTEIN: 7 g/dL (ref 6.0–8.3)
Total Bilirubin: <0.2 mg/dL — ABNORMAL LOW (ref 0.3–1.2)
Total Bilirubin: 0.4 mg/dL (ref 0.3–1.2)
Total Protein: 6.8 g/dL (ref 6.0–8.3)

## 2014-03-02 LAB — MAGNESIUM: Magnesium: 1.9 mg/dL (ref 1.5–2.5)

## 2014-03-02 LAB — IRON AND TIBC
IRON: 27 ug/dL — AB (ref 42–135)
Saturation Ratios: 6 % — ABNORMAL LOW (ref 20–55)
TIBC: 443 ug/dL (ref 250–470)
UIBC: 416 ug/dL — ABNORMAL HIGH (ref 125–400)

## 2014-03-02 LAB — VITAMIN D 25 HYDROXY (VIT D DEFICIENCY, FRACTURES): Vit D, 25-Hydroxy: 18 ng/mL — ABNORMAL LOW (ref 30–89)

## 2014-03-02 LAB — RETICULOCYTES
RBC.: 3.26 MIL/uL — AB (ref 3.87–5.11)
RETIC COUNT ABSOLUTE: 48.9 10*3/uL (ref 19.0–186.0)
Retic Ct Pct: 1.5 % (ref 0.4–3.1)

## 2014-03-02 LAB — PREPARE RBC (CROSSMATCH)

## 2014-03-02 LAB — VITAMIN B12: VITAMIN B 12: 481 pg/mL (ref 211–911)

## 2014-03-02 LAB — FOLATE: FOLATE: 18.9 ng/mL

## 2014-03-02 LAB — TSH: TSH: 1.436 u[IU]/mL (ref 0.350–4.500)

## 2014-03-02 LAB — PHOSPHORUS: Phosphorus: 3.1 mg/dL (ref 2.3–4.6)

## 2014-03-02 LAB — FERRITIN: Ferritin: 1 ng/mL — ABNORMAL LOW (ref 10–291)

## 2014-03-02 LAB — POC OCCULT BLOOD, ED: Fecal Occult Bld: NEGATIVE

## 2014-03-02 MED ORDER — PANTOPRAZOLE SODIUM 40 MG PO TBEC: 40.0000 mg | DELAYED_RELEASE_TABLET | Freq: Every day | ORAL | Status: DC

## 2014-03-02 MED ORDER — HYDROCODONE-ACETAMINOPHEN 5-325 MG PO TABS: 1.0000 | ORAL_TABLET | ORAL | Status: DC | PRN

## 2014-03-02 MED ORDER — INSULIN ASPART 100 UNIT/ML ~~LOC~~ SOLN
0.0000 [IU] | SUBCUTANEOUS | Status: DC
  Administered 2014-03-02 – 2014-03-05 (×4): 1 [IU] via SUBCUTANEOUS

## 2014-03-02 MED ORDER — FUROSEMIDE 10 MG/ML IJ SOLN
20.0000 mg | Freq: Once | INTRAMUSCULAR | Status: AC
  Administered 2014-03-02: 20 mg via INTRAVENOUS
  Filled 2014-03-02: qty 2

## 2014-03-02 MED ORDER — AMLODIPINE BESYLATE 10 MG PO TABS
10.0000 mg | ORAL_TABLET | Freq: Every day | ORAL | Status: DC
  Administered 2014-03-02 – 2014-03-07 (×6): 10 mg via ORAL
  Filled 2014-03-02 (×6): qty 1

## 2014-03-02 MED ORDER — ACETAMINOPHEN 650 MG RE SUPP: 650.0000 mg | Freq: Four times a day (QID) | RECTAL | Status: DC | PRN

## 2014-03-02 MED ORDER — SODIUM CHLORIDE 0.9 % IJ SOLN
3.0000 mL | Freq: Two times a day (BID) | INTRAMUSCULAR | Status: DC
  Administered 2014-03-03 – 2014-03-07 (×7): 3 mL via INTRAVENOUS

## 2014-03-02 MED ORDER — SODIUM CHLORIDE 0.9 % IV SOLN
500.0000 mL | Freq: Once | INTRAVENOUS | Status: AC
  Administered 2014-03-02: 500 mL via INTRAVENOUS

## 2014-03-02 MED ORDER — ACETAMINOPHEN 325 MG PO TABS
650.0000 mg | ORAL_TABLET | Freq: Once | ORAL | Status: AC
  Administered 2014-03-02: 650 mg via ORAL
  Filled 2014-03-02: qty 2

## 2014-03-02 MED ORDER — PANTOPRAZOLE SODIUM 40 MG IV SOLR
40.0000 mg | Freq: Two times a day (BID) | INTRAVENOUS | Status: DC
  Administered 2014-03-02 (×2): 40 mg via INTRAVENOUS
  Filled 2014-03-02 (×4): qty 40

## 2014-03-02 MED ORDER — ONDANSETRON HCL 4 MG/2ML IJ SOLN
4.0000 mg | Freq: Four times a day (QID) | INTRAMUSCULAR | Status: DC | PRN
Start: 2014-03-02 — End: 2014-03-07

## 2014-03-02 MED ORDER — LISINOPRIL 10 MG PO TABS
10.0000 mg | ORAL_TABLET | Freq: Every day | ORAL | Status: DC
  Administered 2014-03-02 – 2014-03-03 (×2): 10 mg via ORAL
  Filled 2014-03-02 (×2): qty 1

## 2014-03-02 MED ORDER — ACETAMINOPHEN 325 MG PO TABS: 650.0000 mg | ORAL_TABLET | Freq: Four times a day (QID) | ORAL | Status: DC | PRN

## 2014-03-02 MED ORDER — ONDANSETRON HCL 4 MG PO TABS: 4.0000 mg | ORAL_TABLET | Freq: Four times a day (QID) | ORAL | Status: DC | PRN

## 2014-03-02 MED ORDER — DIPHENHYDRAMINE HCL 25 MG PO CAPS
25.0000 mg | ORAL_CAPSULE | Freq: Once | ORAL | Status: AC
  Administered 2014-03-02: 25 mg via ORAL
  Filled 2014-03-02: qty 1

## 2014-03-02 NOTE — H&P (Signed)
PCP: Wynelle Cleveland   Chief Complaint:  Low hg  HPI: Brenda Clark is a 63 y.o. female   has a past medical history of Encounter for long-term (current) use of other medications; Pica; Iron deficiency anemia, unspecified; Other and unspecified hyperlipidemia; Unspecified essential hypertension; Edema; Retinal detachment; Migraine, unspecified, without mention of intractable migraine without mention of status migrainosus; Diverticulosis of colon (without mention of hemorrhage); Type II or unspecified type diabetes mellitus without mention of complication, not stated as uncontrolled; Osteoarthrosis, unspecified whether generalized or localized, lower leg; Morbid obesity; Unspecified sleep apnea; Esophageal reflux; Pulmonary embolus; GERD (gastroesophageal reflux disease); Headache, migraine; Chronic pain; OSA (obstructive sleep apnea); Hyperlipidemia; and Arthritis.   Presented with  Patient with hx of pica eating large amounts of ice. She has been told in the past she has hx of iron deficiency anemia. Patient has hx of PE on chronic coumadin for the past 2 years. For the past week she have had black stools and shortness of breath. She presented to Dr. Wynelle Cleveland and had a CBC done that showed hg of 5.2. Patient was sent to ER. In ER she was found to be hemoccult negative on exam and was started on blood transfusion.  Denies menstrual flow.  Reports having colonoscopy long time ago done by [facility]. Denies ever having endoscopy. Patient denies taking NSAIDS.   Review of Systems:     Pertinent positives include: melena, shortness of breath at rest. dyspnea on exertion,  Constitutional:  No weight loss, night sweats, Fevers, chills, fatigue, weight loss  HEENT:  No headaches, Difficulty swallowing,Tooth/dental problems,Sore throat,  No sneezing, itching, ear ache, nasal congestion, post nasal drip,  Cardio-vascular:  No chest pain, Orthopnea, PND, anasarca, dizziness, palpitations.no Bilateral lower extremity  swelling  GI:  No heartburn, indigestion, abdominal pain, nausea, vomiting, diarrhea, change in bowel habits, loss of appetite, , blood in stool, hematemesis Resp:  no  No  No excess mucus, no productive cough, No non-productive cough, No coughing up of blood.No change in color of mucus.No wheezing. Skin:  no rash or lesions. No jaundice GU:  no dysuria, change in color of urine, no urgency or frequency. No straining to urinate.  No flank pain.  Musculoskeletal:  No joint pain or no joint swelling. No decreased range of motion. No back pain.  Psych:  No change in mood or affect. No depression or anxiety. No memory loss.  Neuro: no localizing neurological complaints, no tingling, no weakness, no double vision, no gait abnormality, no slurred speech, no confusion  Otherwise ROS are negative except for above, 10 systems were reviewed  Past Medical History: Past Medical History  Diagnosis Date  . Encounter for long-term (current) use of other medications   . Pica   . Iron deficiency anemia, unspecified   . Other and unspecified hyperlipidemia   . Unspecified essential hypertension   . Edema   . Retinal detachment   . Migraine, unspecified, without mention of intractable migraine without mention of status migrainosus   . Diverticulosis of colon (without mention of hemorrhage)   . Type II or unspecified type diabetes mellitus without mention of complication, not stated as uncontrolled   . Osteoarthrosis, unspecified whether generalized or localized, lower leg   . Morbid obesity   . Unspecified sleep apnea   . Esophageal reflux   . Pulmonary embolus   . GERD (gastroesophageal reflux disease)   . Headache, migraine   . Chronic pain   . OSA (obstructive sleep apnea)   .  Hyperlipidemia   . Arthritis    Past Surgical History  Procedure Laterality Date  . Cataract extraction    . Lumbar disc surgery    . Tubal ligation       Medications: Prior to Admission medications    Medication Sig Start Date End Date Taking? Authorizing Provider  ACCU-CHEK AVIVA PLUS test strip 1 each by Other route as needed.  02/27/14  Yes Historical Provider, MD  ACCU-CHEK SOFTCLIX LANCETS lancets 1 each as needed.  02/27/14  Yes Historical Provider, MD  acetaminophen (TYLENOL) 650 MG CR tablet Take 1,300 mg by mouth every 8 (eight) hours as needed for pain.   Yes Historical Provider, MD  amLODipine (NORVASC) 10 MG tablet Take 1 tablet (10 mg total) by mouth daily. 02/27/14  Yes Debbe Odea, MD  diphenhydramine-acetaminophen (TYLENOL PM) 25-500 MG TABS Take 1 tablet by mouth at bedtime as needed (for sleep).   Yes Historical Provider, MD  glucose monitoring kit (FREESTYLE) monitoring kit 1 each by Does not apply route as needed for other. Dispense any model that is covered- dispense testing supplies for Q AC/ HS accuchecks- 1 month supply with 6 refills. 02/27/14  Yes Debbe Odea, MD  lisinopril (PRINIVIL,ZESTRIL) 10 MG tablet Take 1 tablet (10 mg total) by mouth daily. 02/27/14  Yes Debbe Odea, MD  metFORMIN (GLUCOPHAGE-XR) 500 MG 24 hr tablet Take 1 tablet (500 mg total) by mouth daily with breakfast. 02/27/14  Yes Debbe Odea, MD  nitroGLYCERIN (NITROSTAT) 0.4 MG SL tablet Place 0.4 mg under the tongue every 5 (five) minutes as needed.     Yes Historical Provider, MD  omeprazole (PRILOSEC) 20 MG capsule Take 2 capsules (40 mg total) by mouth daily. 02/27/14  Yes Debbe Odea, MD  warfarin (COUMADIN) 7.5 MG tablet Take 3.75-7.5 mg by mouth See admin instructions. Take 1 tablet by mouth on Monday, Wednesday, Friday.  Take 1/2 tablet (3.26m) by mouth on Tuesday, Thursday, Saturday, sunday   Yes Historical Provider, MD    Allergies:  No Known Allergies  Social History:  Ambulatory with cane Lives at  Home alone   reports that she has never smoked. She has never used smokeless tobacco. She reports that she does not drink alcohol or use illicit drugs.   Family History: family history  includes Breast cancer in her sister; Colon cancer in her father; Diabetes in her sister.    Physical Exam: Patient Vitals for the past 24 hrs:  BP Temp Temp src Pulse Resp SpO2  03/02/14 0245 127/66 mmHg 97.8 F (36.6 C) Oral 79 17 100 %  03/02/14 0230 149/72 mmHg 97.5 F (36.4 C) Oral 94 17 100 %  03/02/14 0215 115/51 mmHg 97.5 F (36.4 C) Oral 76 24 99 %  03/02/14 0100 127/22 mmHg - - 80 25 99 %  03/02/14 0050 114/45 mmHg - - 80 - 100 %  03/01/14 2305 142/69 mmHg 98.3 F (36.8 C) Oral 94 18 99 %    1. General:  in No Acute distress 2. Psychological: Alert and Oriented 3. Head/ENT:   Moist  Mucous Membranes                          Head Non traumatic, neck supple                          Normal   Dentition 4. SKIN: normal  Skin turgor,  Skin clean Dry and  intact no rash, pale 5. Heart: Regular rate and rhythm no Murmur, Rub or gallop 6. Lungs: Clear to auscultation bilaterally, no wheezes or crackles   7. Abdomen: Soft, non-tender, Non distended, obese 8. Lower extremities: no clubbing, cyanosis, or edema 9. Neurologically Grossly intact, moving all 4 extremities equally 10. MSK: Normal range of motion Hemoccult neg per ER  body mass index is unknown because there is no weight on file.   Labs on Admission:   Recent Labs  03/02/14 0004  NA 142  K 4.5  CL 106  CO2 23  GLUCOSE 128*  BUN 14  CREATININE 0.60  CALCIUM 8.8    Recent Labs  03/02/14 0004  AST 19  ALT 11  ALKPHOS 83  BILITOT <0.2*  PROT 7.0  ALBUMIN 3.1*   No results found for this basename: LIPASE, AMYLASE,  in the last 72 hours  Recent Labs  03/02/14 0004  WBC 5.5  NEUTROABS 3.4  HGB 5.2*  HCT 19.9*  MCV 68.2*  PLT 243   No results found for this basename: CKTOTAL, CKMB, CKMBINDEX, TROPONINI,  in the last 72 hours No results found for this basename: TSH, T4TOTAL, FREET3, T3FREE, THYROIDAB,  in the last 72 hours No results found for this basename: VITAMINB12, FOLATE, FERRITIN, TIBC,  IRON, RETICCTPCT,  in the last 72 hours Lab Results  Component Value Date   HGBA1C 6 02/27/2014    The CrCl is unknown because both a height and weight (above a minimum accepted value) are required for this calculation. ABG    Component Value Date/Time   PHART 7.490* 05/12/2011 1859   HCO3 20.8 05/12/2011 1859   TCO2 22 05/12/2011 1859   ACIDBASEDEF 2.0 05/12/2011 1859   O2SAT 91.0 05/12/2011 1859     No results found for this basename: DDIMER     Other results:   Cultures: No results found for this basename: sdes, specrequest, cult, reptstatus       Radiological Exams on Admission: No results found.  Chart has been reviewed  Assessment/Plan  63 yo F w hx of irondeficiecy anemia here with Hg of 5.2 and hx of melena x 1 week  Present on Admission:  . Symptomatic anemia - anemia panel, [facility] Evert will 3 units of packed red blood cells and follow CBC.  . Melena - voice and post loss or GI bleed we'll make sure patient on protonix, GI consult in AM . DIABETES MELLITUS, TYPE II - SSI hold metformin  . Embolism, pulmonary with infarction - hold Coumadin for now given history of melena and significant anemia she was 2 years ago according to patient.  Marland Kitchen HYPERTENSION - continue home medications, currently stable . Sleep apnea with use of continuous positive airway pressure (CPAP)- cpap ordered    Prophylaxis: SCD  Protonix  CODE STATUS: FULL CODE  Other plan as per orders.  I have spent a total of 55 min on this admission  Kaine Mcquillen 03/02/2014, 3:01 AM

## 2014-03-02 NOTE — Progress Notes (Signed)
Pt arrive to unit - pt in no s/s of distress. Pt receiving blood upon arrival. Pt oriented to room. Whiteboard updated. Callbell within reach. Will continue to monitor. VS stable. Tele applied. Report received from [place], RN prior to pt's arrival.

## 2014-03-02 NOTE — Progress Notes (Signed)
[facility] PROGRESS NOTE   Brenda Clark ZOX:[id number] DOB: 1951/10/24 DOA: 03/02/2014 PCP: Dorrene GermanAVBUERE,EDWIN A, MD  HPI/Subjective: Patient seen with son at bedside. Denies any dizziness or shortness of breath.  Assessment/Plan: Principal Problem:   Anemia Active Problems:   DIABETES MELLITUS, TYPE II   HYPERTENSION   Encounter for long-term (current) use of anticoagulants   Embolism, pulmonary with infarction   Sleep apnea with use of continuous positive airway pressure (CPAP)   Symptomatic anemia   Melena   Anemia -Acute on chronic anemia, Likely secondary to GI bleed, presented with hemoglobin of 5.2. -Has chronic anemia with a hemoglobin baseline about 8.4. -In 6 of 2012 she has ferritin of 9 and iron of 11. -Patient did receive 2 units of packed RBCs hemoglobin increased to 6.6. I will transfuse 2 more units of blood. -Patient is on Coumadin INR is 2.3.  GI bleed -In settings of anticoagulation, Coumadin held. [facility] gastroenterology consulted for further evaluation.  History of PE -Patient is on Coumadin, hold Coumadin, had a VQ scan on 05/2011 with high probability of PE. -We will review the rest of the indications for anticoagulation. -Patient might not need to take Coumadin as she has GI bleed.  Diabetes mellitus type 2 -Controlled diabetes mellitus with hemoglobin A1c of 6.0. -Continue home medications, carb modified diet when she is about to eat and SSI.  Morbid obesity -Patient weighs 384 pounds with calculated BMI of 66.1.   Code Status: Full code Family Communication: Plan discussed with the patient. Disposition Plan: Remains inpatient   Consultants:  [facility]  Procedures:  None  Antibiotics:  None   Objective: Filed Vitals:   03/02/14 1320  BP: 124/72  Pulse: 74  Temp: 98.4 F (36.9 C)  Resp: 20    Intake/Output Summary (Last 24 hours) at 03/02/14 1340 Last data filed at 03/02/14 0547  Gross per 24 hour  Intake   472.5 ml  Output      0 ml  Net  472.5 ml   Filed Weights   03/02/14 0419  Weight: 174.4 kg (384 lb 7.7 oz)    Exam: General: Alert and awake, oriented x3, not in any acute distress. HEENT: anicteric sclera, pupils reactive to light and accommodation, EOMI CVS: S1-S2 clear, no murmur rubs or gallops Chest: clear to auscultation bilaterally, no wheezing, rales or rhonchi Abdomen: soft nontender, nondistended, normal bowel sounds, no organomegaly Extremities: no cyanosis, clubbing or edema noted bilaterally Neuro: Cranial nerves II-XII intact, no focal neurological deficits  Data Reviewed: Basic Metabolic Panel:  Recent Labs Lab 03/01/14 0908 03/02/14 0004 03/02/14 1220  NA 139 142 139  K 4.1 4.5 4.1  CL 107 106 103  CO2 24 23 25   GLUCOSE 115* 128* 102*  BUN 11 14 10   CREATININE 0.61 0.60 0.58  CALCIUM 8.3* 8.8 8.7  MG  --   --  1.9  PHOS  --   --  3.1   Liver Function Tests:  Recent Labs Lab 03/01/14 0908 03/02/14 0004 03/02/14 1220  AST 15 19 15   ALT 10 11 10   ALKPHOS 70 83 79  BILITOT 0.3 <0.2* 0.4  PROT 6.5 7.0 6.8  ALBUMIN 3.6 3.1* 3.1*   No results found for this basename: LIPASE, AMYLASE,  in the last 168 hours No results found for this basename: AMMONIA,  in the last 168 hours CBC:  Recent Labs Lab 03/01/14 0908 03/02/14 0004 03/02/14 1220  WBC 4.7 5.5 4.9  NEUTROABS  --  3.4  --  HGB 5.2* 5.2* 6.6*  HCT 19.6* 19.9* 23.4*  MCV 65.6* 68.2* 70.9*  PLT 288 243 212   Cardiac Enzymes: No results found for this basename: CKTOTAL, CKMB, CKMBINDEX, TROPONINI,  in the last 168 hours BNP (last 3 results) No results found for this basename: PROBNP,  in the last 8760 hours CBG:  Recent Labs Lab 03/02/14 0438 03/02/14 0803 03/02/14 1210  GLUCAP 124* 104* 108*    Micro No results found for this or any previous visit (from the past 240 hour(s)).   Studies: No results found.  Scheduled Meds: . amLODipine  10 mg Oral Daily  . furosemide   20 mg Intravenous Once  . insulin aspart  0-9 Units Subcutaneous 6 times per day  . lisinopril  10 mg Oral Daily  . pantoprazole (PROTONIX) IV  40 mg Intravenous Q12H  . sodium chloride  3 mL Intravenous Q12H   Continuous Infusions:      Time spent: 35 minutes    [facility] A  [facility] Pager [phone] If 7PM-7AM, please contact night-coverage at [email or web address], password [facility] 03/02/2014, 1:40 PM  LOS: 0 days

## 2014-03-02 NOTE — Consult Note (Signed)
                                                                           [facility] Consult: 11:35 AM 03/02/2014  LOS: 0 days    Referring Provider: Dr Doutova Primary Care Physician:  [facility], Dr Rizwan Primary Gastroenterologist:  None.      Reason for Consultation:  Anemia.     HPI: Brenda Clark is a 63 y.o. female.  Obese type 2 diabetic on Coumadin since PE of 2012.  Iron deficiency, microcytic anemia (Hgb 8.1) in 2012 required transfusion.  Discharged on NuIron.  Hgb to 7.6 in 04/2013.  Not taking po Iron for a long time.    Established care with [facility] on Saturday.  Labs obtained on 3/23 with Hgb of 5.2, MCV 65.  Sent to [facility] for admission and transfusion.  FOBT negative.  In last 2 weeks had seen dark brown, not melenic, formed stools intermittently.  No nausea, no anorexia.  6 months progressive DOE/weakness, minimal dizziness.  + ice pica.  Had colonoscopy 15 to 20 yrs ago at [place] but no procedure or pathology reports in Epic.   No nose bleed, large hematomas, weight loss.  No ASA products or NSAIDs.  No recent dose adjustments to Coumadin.  She has a painless "knot" in RUQ, been there for years. Taking Prilosec for heartburn.  No dysphagia.   Dad died with later onset colon cancer at 82.      Past Medical History  Diagnosis Date  . Iron deficiency anemia, unspecified     ice pica  . Other and unspecified hyperlipidemia   . Unspecified essential hypertension   . Retinal detachment   . Migraine, unspecified, without mention of intractable migraine without mention of status migrainosus   . Type II or unspecified type diabetes mellitus without mention of complication, not stated as uncontrolled   . Osteoarthrosis, unspecified whether generalized or localized, lower leg   . Morbid obesity   . Pulmonary embolus 2012  . GERD  (gastroesophageal reflux disease)   . Headache, migraine   . Chronic pain   . OSA (obstructive sleep apnea)   . Hyperlipidemia   . Arthritis     DDD and HNP on MRI in 2008  . Endometrial polyp 08/2010    biopsied.     Past Surgical History  Procedure Laterality Date  . Cataract extraction    . Lumbar disc surgery    . Tubal ligation      Prior to Admission medications   Medication Sig Start Date End Date Taking? Authorizing Provider  ACCU-CHEK AVIVA PLUS test strip 1 each by Other route as needed.  02/27/14  Yes Historical Provider, MD  ACCU-CHEK SOFTCLIX LANCETS lancets 1 each as needed.  02/27/14  Yes Historical Provider, MD  acetaminophen (TYLENOL) 650 MG CR tablet Take 1,300 mg by mouth every 8 (eight) hours as needed for pain.   Yes Historical Provider, MD  amLODipine (NORVASC) 10 MG tablet Take 1 tablet (10 mg total) by mouth daily. 02/27/14  Yes Saima Rizwan, MD  diphenhydramine-acetaminophen (TYLENOL PM) 25-500 MG TABS Take 1 tablet by mouth at bedtime as needed (for sleep).     Yes Historical Provider, MD  glucose monitoring kit (FREESTYLE) monitoring kit 1 each by Does not apply route as needed for other. Dispense any model that is covered- dispense testing supplies for Q AC/ HS accuchecks- 1 month supply with 6 refills. 02/27/14  Yes Saima Rizwan, MD  lisinopril (PRINIVIL,ZESTRIL) 10 MG tablet Take 1 tablet (10 mg total) by mouth daily. 02/27/14  Yes Saima Rizwan, MD  metFORMIN (GLUCOPHAGE-XR) 500 MG 24 hr tablet Take 1 tablet (500 mg total) by mouth daily with breakfast. 02/27/14  Yes Saima Rizwan, MD  nitroGLYCERIN (NITROSTAT) 0.4 MG SL tablet Place 0.4 mg under the tongue every 5 (five) minutes as needed.     Yes Historical Provider, MD  omeprazole (PRILOSEC) 20 MG capsule Take 2 capsules (40 mg total) by mouth daily. 02/27/14  Yes Saima Rizwan, MD  warfarin (COUMADIN) 7.5 MG tablet Take 3.75-7.5 mg by mouth See admin instructions. Take 1 tablet by mouth on Monday, Wednesday,  Friday.  Take 1/2 tablet (3.75mg) by mouth on Tuesday, Thursday, Saturday, sunday   Yes Historical Provider, MD    Scheduled Meds: . amLODipine  10 mg Oral Daily  . insulin aspart  0-9 Units Subcutaneous 6 times per day  . lisinopril  10 mg Oral Daily  . pantoprazole (PROTONIX) IV  40 mg Intravenous Q12H  . sodium chloride  3 mL Intravenous Q12H   Infusions:   PRN Meds: acetaminophen, acetaminophen, HYDROcodone-acetaminophen, ondansetron (ZOFRAN) IV, ondansetron   Allergies as of 03/01/2014  . (No Known Allergies)    Family History  Problem Relation Age of Onset  . Colon cancer Father   . Breast cancer Sister   . Diabetes Sister        Hodgkins Lymphoma                                          Daughter.   History   Social History  . Marital Status: Divorced    Spouse Name: N/A    Number of Children: 2  . Years of Education: HS   Occupational History  . DISABLED     since 2007   Social History Main Topics  . Smoking status: Never Smoker   . Smokeless tobacco: Never Used  . Alcohol Use: No  . Drug Use: No  . Sexual Activity: Not on file   Other Topics Concern  . Not on file   Social History Narrative   Patient with obesity, hyperventilation and obstructive apneas, successfully treated with CPAP at 10 cm water. Good residual AHI values for the last year and low Epworth score. Compliance is good. [facility] follows her.    Her GERD is better on omeprazole and this has further reduced her nocturnal awakenings. She is still hypertensive and needs urgently to lose weight, this would positivley affect her diabetes as well.       I convinced her to take her Topamax and take her Lasix in the AM at 20 mg. She was advised of low carb diet and potassium rich foods.       Patient lives at home alone.   Caffeine Use: occasionally    REVIEW OF SYSTEMS: Constitutional:  Per HPI ENT:  No nose bleeds Pulm:  DOE.  No cough CV:  No palpitations, no LE edema.  GU:  No  hematuria, no frequency GI:  No dysphagia.  Daily BMs 1 to 2   x per day. Heme:  Per HPI   Transfusions:  Per HPI Neuro:  No headaches, no peripheral tingling or numbness Derm:  No itching, no rash or sores.  Endocrine:  No sweats or chills.  No polyuria or dysuria Immunization:  Declines vaccinations.  Travel:  None beyond local counties in last few months.    PHYSICAL EXAM: Vital signs in last 24 hours: Filed Vitals:   03/02/14 0845  BP: 126/61  Pulse: 71  Temp: 97.7 F (36.5 C)  Resp: 20   Wt Readings from Last 3 Encounters:  03/02/14 174.4 kg (384 lb 7.7 oz)  02/27/14 174.635 kg (385 lb)  05/08/13 176.449 kg (389 lb)   General: obese AAF in NAD Head:  No asymmetry or trauma  Eyes:  + conj pallor Ears:  Not HOH  Nose:  No discharge or congestion Mouth:  Clear and moist. Neck:  No mass, no TMG Lungs:  Clear bil.  No cough or dyspnea at rest Heart: RRR.  No MRG Abdomen:  Soft, obese, fullness without palpable mass or obvious hernia in left UQ.   Rectal: deferred.  FOBT negative yesterday   Musc/Skeltl: no joint contractures Extremities:  No CCE  Neurologic:  Oriented x 3.  No tremor.  No limb weakness Skin:  No telangectasia, no rash or sores Tattoos:  none Nodes:  No cervical adenopathy   Psych:  Pleasant, relaxed, not depressed.       LAB RESULTS:  Recent Labs  03/01/14 0908 03/02/14 0004  WBC 4.7 5.5  HGB 5.2* 5.2*  HCT 19.6* 19.9*  PLT 288 243   BMET Lab Results  Component Value Date   NA 142 03/02/2014   NA 139 03/01/2014   NA 141 05/07/2013   K 4.5 03/02/2014   K 4.1 03/01/2014   K 3.5 05/07/2013   CL 106 03/02/2014   CL 107 03/01/2014   CL 106 05/07/2013   CO2 23 03/02/2014   CO2 24 03/01/2014   CO2 27 05/07/2013   GLUCOSE 128* 03/02/2014   GLUCOSE 115* 03/01/2014   GLUCOSE 97 05/07/2013   BUN 14 03/02/2014   BUN 11 03/01/2014   BUN 12 05/07/2013   CREATININE 0.60 03/02/2014   CREATININE 0.61 03/01/2014   CREATININE 0.6 05/07/2013   CALCIUM 8.8  03/02/2014   CALCIUM 8.3* 03/01/2014   CALCIUM 8.7 05/07/2013   LFT  Recent Labs  03/01/14 0908 03/02/14 0004  PROT 6.5 7.0  ALBUMIN 3.6 3.1*  AST 15 19  ALT 10 11  ALKPHOS 70 83  BILITOT 0.3 <0.2*   PT/INR Lab Results  Component Value Date   INR 2.38* 03/02/2014   INR 2.6 03/01/2014   INR 2.6 12/28/2013    RADIOLOGY STUDIES: No results found.  ENDOSCOPIC STUDIES: Colonoscopy at least 10 years ago.   IMPRESSION:   *  Anemia Hx IDA dating back to 05/2011 when she says she was transfused.  Has not been on Iron.   *  PE 05/2011.  Coumadin on hold but INR still therapeutic.   *  OSA, morbid obesity  *  Type 2 DM.  On Glucophage at home.   *  ? Ventral hernia in LUQ.      PLAN:     *  Needs colonoscopy and EGD when INR normalizes, ? On 3/26? *  Anemia labs ordered.     Sarah Gribbin  03/02/2014, 11:35 AM Pager: [phone]     [facility] Attending  I have also seen and   assessed the patient and agree with the above note. Ferritin is 1  Needs colonoscopy/EGD when INR below 2 The risks and benefits as well as alternatives of endoscopic procedure(s) have been discussed and reviewed. All questions answered. The patient agrees to proceed.  Carl E. Gessner, MD, FACG [facility] [phone] (pager) 03/02/2014 6:22 PM   

## 2014-03-02 NOTE — ED Provider Notes (Signed)
CSN: [id number]     Arrival date & time 03/01/14  2256 History   First MD Initiated Contact with Patient 03/02/14 0107     Chief Complaint  Patient presents with  . Anemia     (Consider location/radiation/quality/duration/timing/severity/associated sxs/prior Treatment) HPI This is a 63 year old female with a history of anemia on Coumadin for DVT. She was called by her PCP yesterday evening and advised that her hemoglobin was dangerously low and she needed to go the hospital. She acknowledges that she's been short of breath with exertion for about 2 weeks but denies chest pain. She states she has a long standing history of eating ice. She is not aware of having any black tarry stools. She has some mild edema in her legs. Her hemoglobin obtained on arrival was noted to be 5.2.  Past Medical History  Diagnosis Date  . Encounter for long-term (current) use of other medications   . Pica   . Iron deficiency anemia, unspecified   . Other and unspecified hyperlipidemia   . Unspecified essential hypertension   . Edema   . Retinal detachment   . Migraine, unspecified, without mention of intractable migraine without mention of status migrainosus   . Diverticulosis of colon (without mention of hemorrhage)   . Type II or unspecified type diabetes mellitus without mention of complication, not stated as uncontrolled   . Osteoarthrosis, unspecified whether generalized or localized, lower leg   . Morbid obesity   . Unspecified sleep apnea   . Esophageal reflux   . Pulmonary embolus   . GERD (gastroesophageal reflux disease)   . Headache, migraine   . Chronic pain   . OSA (obstructive sleep apnea)   . Hyperlipidemia   . Arthritis    Past Surgical History  Procedure Laterality Date  . Cataract extraction    . Lumbar disc surgery    . Tubal ligation     Family History  Problem Relation Age of Onset  . Colon cancer Father   . Breast cancer Sister   . Diabetes Sister    History  Substance  Use Topics  . Smoking status: Never Smoker   . Smokeless tobacco: Never Used  . Alcohol Use: No   OB History   Grav Para Term Preterm Abortions TAB SAB Ect Mult Living                 Review of Systems  All other systems reviewed and are negative.    Allergies  Review of patient's allergies indicates no known allergies.  Home Medications   Current Outpatient Rx  Name  Route  Sig  Dispense  Refill  . ACCU-CHEK AVIVA PLUS test strip   Other   1 each by Other route as needed.          Marland Kitchen ACCU-CHEK SOFTCLIX LANCETS lancets      1 each as needed.          Marland Kitchen acetaminophen (TYLENOL) 650 MG CR tablet   Oral   Take 1,300 mg by mouth every 8 (eight) hours as needed for pain.         Marland Kitchen amLODipine (NORVASC) 10 MG tablet   Oral   Take 1 tablet (10 mg total) by mouth daily.   30 tablet   6   . diphenhydramine-acetaminophen (TYLENOL PM) 25-500 MG TABS   Oral   Take 1 tablet by mouth at bedtime as needed (for sleep).         Marland Kitchen glucose monitoring  kit (FREESTYLE) monitoring kit   Does not apply   1 each by Does not apply route as needed for other. Dispense any model that is covered- dispense testing supplies for Q AC/ HS accuchecks- 1 month supply with 6 refills.   1 each   1   . lisinopril (PRINIVIL,ZESTRIL) 10 MG tablet   Oral   Take 1 tablet (10 mg total) by mouth daily.   30 tablet   6   . metFORMIN (GLUCOPHAGE-XR) 500 MG 24 hr tablet   Oral   Take 1 tablet (500 mg total) by mouth daily with breakfast.   60 tablet   6   . nitroGLYCERIN (NITROSTAT) 0.4 MG SL tablet   Sublingual   Place 0.4 mg under the tongue every 5 (five) minutes as needed.           Marland Kitchen omeprazole (PRILOSEC) 20 MG capsule   Oral   Take 2 capsules (40 mg total) by mouth daily.   30 capsule   6   . warfarin (COUMADIN) 7.5 MG tablet   Oral   Take 3.75-7.5 mg by mouth See admin instructions. Take 1 tablet by mouth on Monday, Wednesday, Friday.  Take 1/2 tablet (3.8m) by mouth on  Tuesday, Thursday, Saturday, sunday          BP 114/45  Pulse 80  Temp(Src) 98.3 F (36.8 C) (Oral)  Resp 18  SpO2 100%  Physical Exam General: Well-developed, obese female in no acute distress; appearance consistent with age of record HENT: normocephalic; atraumatic Eyes: pupils equal, round and reactive to light; extraocular muscles intact; conjunctival pallor Neck: supple Heart: regular rate and rhythm Lungs: clear to auscultation bilaterally Abdomen: soft; obese; mild epigastric tenderness; evaluation for masses limited by obesity; bowel present Rectal: Normal sphincter tone; thrombosed external hemorrhoids; stool in vault, brown on examining glove, heme-negative Extremities: No deformity; full range of motion; 1+ pitting edema of lower legs Neurologic: Awake, alert and oriented; motor function intact in all extremities and symmetric; no facial droop Skin: Warm and dry Psychiatric: Normal mood and affect    ED Course  Procedures (including critical care time)    MDM   Nursing notes and vitals signs, including pulse oximetry, reviewed.  Summary of this visit's results, reviewed by myself:  Labs:  Results for orders placed during the hospital encounter of 03/02/14 (from the past 24 hour(s))  CBC WITH DIFFERENTIAL     Status: Abnormal   Collection Time    03/02/14 12:04 AM      Result Value Ref Range   WBC 5.5  4.0 - 10.5 K/uL   RBC 2.92 (*) 3.87 - 5.11 MIL/uL   Hemoglobin 5.2 (*) 12.0 - 15.0 g/dL   HCT 19.9 (*) 36.0 - 46.0 %   MCV 68.2 (*) 78.0 - 100.0 fL   MCH 17.8 (*) 26.0 - 34.0 pg   MCHC 26.1 (*) 30.0 - 36.0 g/dL   RDW 21.8 (*) 11.5 - 15.5 %   Platelets 243  150 - 400 K/uL   Neutrophils Relative % 64  43 - 77 %   Lymphocytes Relative 27  12 - 46 %   Monocytes Relative 7  3 - 12 %   Eosinophils Relative 1  0 - 5 %   Basophils Relative 1  0 - 1 %   Neutro Abs 3.4  1.7 - 7.7 K/uL   Lymphs Abs 1.5  0.7 - 4.0 K/uL   Monocytes Absolute 0.4  0.1 - 1.0 K/uL  Eosinophils Absolute 0.1  0.0 - 0.7 K/uL   Basophils Absolute 0.1  0.0 - 0.1 K/uL   RBC Morphology TARGET CELLS    COMPREHENSIVE METABOLIC PANEL     Status: Abnormal   Collection Time    03/02/14 12:04 AM      Result Value Ref Range   Sodium 142  137 - 147 mEq/L   Potassium 4.5  3.7 - 5.3 mEq/L   Chloride 106  96 - 112 mEq/L   CO2 23  19 - 32 mEq/L   Glucose, Bld 128 (*) 70 - 99 mg/dL   BUN 14  6 - 23 mg/dL   Creatinine, Ser 0.60  0.50 - 1.10 mg/dL   Calcium 8.8  8.4 - 10.5 mg/dL   Total Protein 7.0  6.0 - 8.3 g/dL   Albumin 3.1 (*) 3.5 - 5.2 g/dL   AST 19  0 - 37 U/L   ALT 11  0 - 35 U/L   Alkaline Phosphatase 83  39 - 117 U/L   Total Bilirubin <0.2 (*) 0.3 - 1.2 mg/dL   GFR calc non Af Amer >90  >90 mL/min   GFR calc Af Amer >90  >90 mL/min  TYPE AND SCREEN     Status: None   Collection Time    03/02/14 12:05 AM      Result Value Ref Range   ABO/RH(D) AB POS     Antibody Screen NEG     Sample Expiration 03/05/2014     Unit Number [id number]     Blood Component Type RED CELLS,LR     Unit division 00     Status of Unit ALLOCATED     Transfusion Status OK TO TRANSFUSE     Crossmatch Result Compatible     Unit Number [id number]     Blood Component Type RED CELLS,LR     Unit division 00     Status of Unit ISSUED     Transfusion Status OK TO TRANSFUSE     Crossmatch Result Compatible    PREPARE RBC (CROSSMATCH)     Status: None   Collection Time    03/02/14 12:05 AM      Result Value Ref Range   Order Confirmation ORDER PROCESSED BY BLOOD BANK    PROTIME-INR     Status: Abnormal   Collection Time    03/02/14  1:11 AM      Result Value Ref Range   Prothrombin Time 25.2 (*) 11.6 - 15.2 seconds   INR 2.38 (*) 0.00 - 1.49        Shiana Rappleye L Kassadi Presswood, MD 03/02/14 0230

## 2014-03-02 NOTE — Progress Notes (Signed)
Utilization review completed.  

## 2014-03-02 NOTE — ED Notes (Signed)
Molpus, MD is aware of the pt's critical hemoglobin.

## 2014-03-03 DIAGNOSIS — Z86718 Personal history of other venous thrombosis and embolism: Secondary | ICD-10-CM

## 2014-03-03 LAB — GLUCOSE, CAPILLARY
GLUCOSE-CAPILLARY: 113 mg/dL — AB (ref 70–99)
Glucose-Capillary: 111 mg/dL — ABNORMAL HIGH (ref 70–99)
Glucose-Capillary: 117 mg/dL — ABNORMAL HIGH (ref 70–99)
Glucose-Capillary: 125 mg/dL — ABNORMAL HIGH (ref 70–99)
Glucose-Capillary: 96 mg/dL (ref 70–99)
Glucose-Capillary: 98 mg/dL (ref 70–99)

## 2014-03-03 LAB — TYPE AND SCREEN
ABO/RH(D): AB POS
Antibody Screen: NEGATIVE
Unit division: 0
Unit division: 0
Unit division: 0
Unit division: 0

## 2014-03-03 LAB — CBC
HEMATOCRIT: 26.2 % — AB (ref 36.0–46.0)
HEMATOCRIT: 28.9 % — AB (ref 36.0–46.0)
Hemoglobin: 7.7 g/dL — ABNORMAL LOW (ref 12.0–15.0)
Hemoglobin: 8.4 g/dL — ABNORMAL LOW (ref 12.0–15.0)
MCH: 21.2 pg — AB (ref 26.0–34.0)
MCH: 21.1 pg — ABNORMAL LOW (ref 26.0–34.0)
MCHC: 29.4 g/dL — AB (ref 30.0–36.0)
MCHC: 29.1 g/dL — AB (ref 30.0–36.0)
MCV: 72 fL — ABNORMAL LOW (ref 78.0–100.0)
MCV: 72.4 fL — ABNORMAL LOW (ref 78.0–100.0)
Platelets: 191 10*3/uL (ref 150–400)
Platelets: 191 10*3/uL (ref 150–400)
RBC: 3.64 MIL/uL — ABNORMAL LOW (ref 3.87–5.11)
RBC: 3.99 MIL/uL (ref 3.87–5.11)
RDW: 21 % — ABNORMAL HIGH (ref 11.5–15.5)
RDW: 20.9 % — AB (ref 11.5–15.5)
WBC: 6 10*3/uL (ref 4.0–10.5)
WBC: 5.4 10*3/uL (ref 4.0–10.5)

## 2014-03-03 LAB — HEMOGLOBIN A1C
Hgb A1c MFr Bld: 5.7 % — ABNORMAL HIGH
Mean Plasma Glucose: 117 mg/dL — ABNORMAL HIGH

## 2014-03-03 LAB — PROTIME-INR
INR: 1.83 — AB (ref 0.00–1.49)
PROTHROMBIN TIME: 20.6 s — AB (ref 11.6–15.2)

## 2014-03-03 MED ORDER — BISACODYL 5 MG PO TBEC
5.0000 mg | DELAYED_RELEASE_TABLET | Freq: Two times a day (BID) | ORAL | Status: AC
  Administered 2014-03-03 – 2014-03-04 (×2): 5 mg via ORAL
  Filled 2014-03-03 (×4): qty 1

## 2014-03-03 MED ORDER — PEG-KCL-NACL-NASULF-NA ASC-C 100 G PO SOLR
1.0000 | Freq: Once | ORAL | Status: AC
  Administered 2014-03-03: 200 g via ORAL
  Filled 2014-03-03: qty 1

## 2014-03-03 MED ORDER — PANTOPRAZOLE SODIUM 40 MG PO TBEC
40.0000 mg | DELAYED_RELEASE_TABLET | Freq: Every day | ORAL | Status: DC
  Administered 2014-03-03 – 2014-03-07 (×4): 40 mg via ORAL
  Filled 2014-03-03 (×4): qty 1

## 2014-03-03 MED ORDER — SODIUM CHLORIDE 0.9 % IV SOLN: INTRAVENOUS | Status: DC

## 2014-03-03 NOTE — Progress Notes (Signed)
          Daily Rounding Note  03/03/2014, 2:12 PM  LOS: 1 day   SUBJECTIVE:       No complaints except the clear liquids.   OBJECTIVE:         Vital signs in last 24 hours:    Temp:  [97.3 F (36.3 C)-98.6 F (37 C)] 98.1 F (36.7 C) (03/25 1154) Pulse Rate:  [69-86] 74 (03/25 1154) Resp:  [16-20] 20 (03/25 1154) BP: (94-167)/(49-84) 167/73 mmHg (03/25 1154) SpO2:  [94 %-100 %] 100 % (03/25 1154) Last BM Date: 03/01/14 General: looks well, comfortable.  NAD   Heart: RRR.  No MRG Chest: clear bil.  Abdomen: obese, soft, NT, ND.  Active BS  Extremities: no CCE Neuro/Psych:  Pleasant, alert.   Intake/Output from previous day: 03/24 0701 - 03/25 0700 In: 1045.5 [Blood:1045.5] Out: -   Intake/Output this shift: Total I/O In: 3 [I.V.:3] Out: -   Lab Results:  Recent Labs  03/02/14 1924 03/03/14 0202 03/03/14 0745  WBC 7.2 6.0 5.4  HGB 8.2* 7.7* 8.4*  HCT 28.7* 26.2* 28.9*  PLT 222 191 191   BMET  Recent Labs  03/01/14 0908 03/02/14 0004 03/02/14 1220  NA 139 142 139  K 4.1 4.5 4.1  CL 107 106 103  CO2 24 23 25   GLUCOSE 115* 128* 102*  BUN 11 14 10   CREATININE 0.61 0.60 0.58  CALCIUM 8.3* 8.8 8.7   LFT  Recent Labs  03/01/14 0908 03/02/14 0004 03/02/14 1220  PROT 6.5 7.0 6.8  ALBUMIN 3.6 3.1* 3.1*  AST 15 19 15   ALT 10 11 10   ALKPHOS 70 83 79  BILITOT 0.3 <0.2* 0.4   PT/INR  Recent Labs  03/01/14 1227 03/02/14 0111  LABPROT  --  25.2*  INR 2.6 2.38*     ASSESMENT:   * Anemia, iron deficient. .  Ferritin1!  Hx IDA dating back to 05/2011. Improved post 4 PRBC.  FOBT negative.  * PE/DVT 05/2011. Coumadin on hold but INR still therapeutic.  * OSA, morbid obesity  * Type 2 DM. On Glucophage at home.     PLAN   *  Colonoscopy and EGD when INR <2.  Not ready yet. Did set her up for cases with Propofol on 3/27 at 1330.  Prep orders written.  *  Po Protonix per home routine.       Jennye MoccasinSarah Clark  03/03/2014, 2:12 PM Pager: [phone]  I agree w/ above. Anticipate colonoscopy/EGD Friday 3/27  Brenda Booparl E. Eriko Economos, MD, [facility] [phone] (pager) 03/03/2014 5:59 PM

## 2014-03-03 NOTE — Progress Notes (Signed)
[facility] PROGRESS NOTE  JATON EILERS ZOX:[id number] DOB: [birth year] DOA: 03/02/2014 PCP: Dorrene German, MD  Assessment/Plan: Principal Problem:  Anemia  Active Problems:  DIABETES MELLITUS, TYPE II  HYPERTENSION  Encounter for long-term (current) use of anticoagulants  Embolism, pulmonary with infarction  Sleep apnea with use of continuous positive airway pressure (CPAP)  Symptomatic anemia  Melena   1. 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 GI bleed In settings of anticoagulation, Coumadin held.  -EGD/colon per GI   3. History of PE 2012 from L leg DVT; completed treatment but was not told to stop coumadin  -check DVT US; may not need long term AC; echo (2014): normal LV, RV function   4. Diabetes mellitus type 2  -Controlled diabetes mellitus with hemoglobin A1c of 6.0.  -Continue home medications, carb modified diet when she is about to eat and SSI.  5. Morbid obesity  -Patient weighs 384 pounds with calculated BMI of 66.1. 6. HTN hold diuretics, ACE while NPO   Code Status: full Family Communication: d/w patient (indicate person spoken with, relationship, and if by phone, the number) Disposition Plan: home 24-48 hours    Consultants:  GI  Procedures:  None   Antibiotics:  None  (indicate start date, and stop date if known)  HPI/Subjective: alert  Objective: Filed Vitals:   03/03/14 1017  BP: 113/72  Pulse:   Temp:   Resp:     Intake/Output Summary (Last 24 hours) at 03/03/14 1049 Last data filed at 03/03/14 1017  Gross per 24 hour  Intake 1048.5 ml  Output      0 ml  Net 1048.5 ml   Filed Weights   03/02/14 0419  Weight: 174.4 kg (384 lb 7.7 oz)    Exam:   General:  alert  Cardiovascular: s1,s2 rrr  Respiratory: CTA BL  Abdomen: soft, nt,nd   Musculoskeletal: no pitting edema   Data Reviewed: Basic Metabolic Panel:  Recent Labs Lab 03/01/14 0908 03/02/14 0004  03/02/14 1220  NA 139 142 139  K 4.1 4.5 4.1  CL 107 106 103  CO2 24 23 25   GLUCOSE 115* 128* 102*  BUN 11 14 10   CREATININE 0.61 0.60 0.58  CALCIUM 8.3* 8.8 8.7  MG  --   --  1.9  PHOS  --   --  3.1   Liver Function Tests:  Recent Labs Lab 03/01/14 0908 03/02/14 0004 03/02/14 1220  AST 15 19 15   ALT 10 11 10   ALKPHOS 70 83 79  BILITOT 0.3 <0.2* 0.4  PROT 6.5 7.0 6.8  ALBUMIN 3.6 3.1* 3.1*   No results found for this basename: LIPASE, AMYLASE,  in the last 168 hours No results found for this basename: AMMONIA,  in the last 168 hours CBC:  Recent Labs Lab 03/02/14 0004 03/02/14 1220 03/02/14 1330 03/02/14 1924 03/03/14 0202 03/03/14 0745  WBC 5.5 4.9 5.0 7.2 6.0 5.4  NEUTROABS 3.4  --   --   --   --   --   HGB 5.2* 6.6* 6.6* 8.2* 7.7* 8.4*  HCT 19.9* 23.4* 23.3* 28.7* 26.2* 28.9*  MCV 68.2* 70.9* 71.5* 72.1* 72.0* 72.4*  PLT 243 212 220 222 191 191   Cardiac Enzymes: No results found for this basename: CKTOTAL, CKMB, CKMBINDEX, TROPONINI,  in the last 168 hours BNP (last 3 results) No results found for this basename: PROBNP,  in the last 8760 hours CBG:  Recent Labs Lab 03/02/14 1616 03/02/14 2020 03/02/14 2313 03/03/14 0328 03/03/14 0802  GLUCAP 91 108* 112* 117* 125*    No results found for this or any previous visit (from the past 240 hour(s)).   Studies: No results found.  Scheduled Meds: . amLODipine  10 mg Oral Daily  . insulin aspart  0-9 Units Subcutaneous 6 times per day  . lisinopril  10 mg Oral Daily  . pantoprazole  40 mg Oral Q0600  . sodium chloride  3 mL Intravenous Q12H   Continuous Infusions:   Principal Problem:   Anemia Active Problems:   DIABETES MELLITUS, TYPE II   HYPERTENSION   Encounter for long-term (current) use of anticoagulants   Embolism, pulmonary with infarction   Sleep apnea with use of continuous positive airway pressure (CPAP)   Symptomatic anemia   Melena    Time spent: >35 minutes      Esperanza SheetsBURIEV, Ixchel Duck N  [facility] Pager [phone]. If 7PM-7AM, please contact night-coverage at [email or web address], password [email or web address] 03/03/2014, 10:49 AM  LOS: 1 day

## 2014-03-03 NOTE — Progress Notes (Deleted)
          Daily Rounding Note  03/03/2014, 8:39 AM  LOS: 1 day   SUBJECTIVE:       No complaints except the clear liquids.   OBJECTIVE:         Vital signs in last 24 hours:    Temp:  [97.3 F (36.3 C)-98.6 F (37 C)] 97.4 F (36.3 C) (03/25 0800) Pulse Rate:  [69-86] 77 (03/25 0800) Resp:  [16-20] 18 (03/25 0800) BP: (94-148)/(49-84) 142/80 mmHg (03/25 0800) SpO2:  [94 %-100 %] 98 % (03/25 0800) Last BM Date: 03/01/14 General: looks well, comfortable.  NAD   Heart: RRR.  No MRG Chest: clear bil.  Abdomen: obese, soft, NT, ND.  Active BS  Extremities: no CCE Neuro/Psych:  Pleasant, alert.   Intake/Output from previous day: 03/24 0701 - 03/25 0700 In: 1045.5 [Blood:1045.5] Out: -   Intake/Output this shift:    Lab Results:  Recent Labs  03/02/14 1924 03/03/14 0202 03/03/14 0745  WBC 7.2 6.0 5.4  HGB 8.2* 7.7* 8.4*  HCT 28.7* 26.2* 28.9*  PLT 222 191 191   BMET  Recent Labs  03/01/14 0908 03/02/14 0004 03/02/14 1220  NA 139 142 139  K 4.1 4.5 4.1  CL 107 106 103  CO2 24 23 25   GLUCOSE 115* 128* 102*  BUN 11 14 10   CREATININE 0.61 0.60 0.58  CALCIUM 8.3* 8.8 8.7   LFT  Recent Labs  03/01/14 0908 03/02/14 0004 03/02/14 1220  PROT 6.5 7.0 6.8  ALBUMIN 3.6 3.1* 3.1*  AST 15 19 15   ALT 10 11 10   ALKPHOS 70 83 79  BILITOT 0.3 <0.2* 0.4   PT/INR  Recent Labs  03/01/14 1227 03/02/14 0111  LABPROT  --  25.2*  INR 2.6 2.38*     ASSESMENT:   * Anemia, iron deficient. .  Ferritin1!  Hx IDA dating back to 05/2011. Improved post 4 PRBC.  FOBT negative.  * PE/DVT 05/2011. Coumadin on hold but INR still therapeutic.  * OSA, morbid obesity  * Type 2 DM. On Glucophage at home.      PLAN   *  Colonoscopy and EGD when INR normalizes.  Not ready yet.  *  Po Protonix per home routine.      Jennye MoccasinSarah Lenix Kidd  03/03/2014, 8:39 AM Pager: [phone]

## 2014-03-03 NOTE — Progress Notes (Signed)
*  PRELIMINARY RESULTS* Vascular Ultrasound Lower extremity venous duplex has been completed.  Preliminary findings: No obvious evidence of DVT in visualized veins. Very limited study due to body habitus.   Farrel DemarkJill Eunice, RDMS, RVT  03/03/2014, 1:39 PM

## 2014-03-04 DIAGNOSIS — D509 Iron deficiency anemia, unspecified: Secondary | ICD-10-CM

## 2014-03-04 DIAGNOSIS — I2699 Other pulmonary embolism without acute cor pulmonale: Secondary | ICD-10-CM

## 2014-03-04 LAB — GLUCOSE, CAPILLARY
GLUCOSE-CAPILLARY: 98 mg/dL (ref 70–99)
Glucose-Capillary: 127 mg/dL — ABNORMAL HIGH (ref 70–99)
Glucose-Capillary: 99 mg/dL (ref 70–99)
Glucose-Capillary: 107 mg/dL — ABNORMAL HIGH (ref 70–99)
Glucose-Capillary: 112 mg/dL — ABNORMAL HIGH (ref 70–99)

## 2014-03-04 LAB — PROTIME-INR
INR: 1.89 — ABNORMAL HIGH (ref 0.00–1.49)
Prothrombin Time: 21.1 seconds — ABNORMAL HIGH (ref 11.6–15.2)

## 2014-03-04 MED ORDER — GI COCKTAIL ~~LOC~~
30.0000 mL | Freq: Once | ORAL | Status: AC
  Administered 2014-03-04: 30 mL via ORAL
  Filled 2014-03-04: qty 30

## 2014-03-04 MED ORDER — SODIUM CHLORIDE 0.9 % IV SOLN
1000.0000 mg | Freq: Once | INTRAVENOUS | Status: AC
  Administered 2014-03-04: 1000 mg via INTRAVENOUS
  Filled 2014-03-04: qty 20

## 2014-03-04 MED ORDER — PEG-KCL-NACL-NASULF-NA ASC-C 100 G PO SOLR
0.5000 | Freq: Once | ORAL | Status: AC
  Administered 2014-03-04: 100 g via ORAL

## 2014-03-04 MED ORDER — SODIUM CHLORIDE 0.9 % IV SOLN
25.0000 mg | Freq: Once | INTRAVENOUS | Status: AC
  Administered 2014-03-04: 25 mg via INTRAVENOUS
  Filled 2014-03-04: qty 0.5

## 2014-03-04 MED ORDER — BIOTENE DRY MOUTH MT LIQD
15.0000 mL | Freq: Two times a day (BID) | OROMUCOSAL | Status: DC
  Administered 2014-03-04 – 2014-03-06 (×6): 15 mL via OROMUCOSAL

## 2014-03-04 MED ORDER — PEG-KCL-NACL-NASULF-NA ASC-C 100 G PO SOLR
0.5000 | Freq: Once | ORAL | Status: AC
  Administered 2014-03-05: 100 g via ORAL
  Filled 2014-03-04: qty 1

## 2014-03-04 MED ORDER — IRON DEXTRAN 50 MG/ML IJ SOLN: 1000.0000 mg | Freq: Once | INTRAMUSCULAR | Status: DC

## 2014-03-04 MED ORDER — CHLORHEXIDINE GLUCONATE 0.12 % MT SOLN
15.0000 mL | Freq: Two times a day (BID) | OROMUCOSAL | Status: DC
  Administered 2014-03-04 – 2014-03-06 (×6): 15 mL via OROMUCOSAL
  Filled 2014-03-04 (×9): qty 15

## 2014-03-04 NOTE — Progress Notes (Signed)
NP had decided to give patient a GI cocktail and get a 12 lead. Will continue to monitor

## 2014-03-04 NOTE — Progress Notes (Signed)
Spoke with patient about CPAP. Patient refuses CPAP at this time. Patient states that if she does not go home tomorrow then she would like a CPAP tomorrow night.

## 2014-03-04 NOTE — Progress Notes (Signed)
Patient called RN into room and stated that her chest was hurting. RN asked patient to describe where the pain was, patient stated that it was in the middle of her chest. She told RN that when she is home, she usually just takes her PRN nitro and she feels better. Paging on call MD now.

## 2014-03-04 NOTE — Progress Notes (Signed)
[facility] PROGRESS NOTE  VICKEE MORMINO OBS:[id number] DOB: 13-Dec-1950 DOA: 03/02/2014 PCP: Philis Fendt, MD  Assessment/Plan: Principal Problem:  Anemia  Active Problems:  DIABETES MELLITUS, TYPE II  HYPERTENSION  Encounter for long-term (current) use of anticoagulants  Embolism, pulmonary with infarction  Sleep apnea with use of continuous positive airway pressure (CPAP)  Symptomatic anemia  Melena   1. 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 GI bleed In settings of anticoagulation, Coumadin held.  -EGD/colon per GI tomorrow  3. History of PE 2012 from L leg DVT; completed treatment but was not told to stop coumadin  -no s/s of DVT, or PE; [place] legs negative for DVT; echo (2014): normal LV, RV function; hold anticoagulation for now  4. Diabetes mellitus type 2  -Controlled diabetes mellitus with hemoglobin A1c of 6.0.  -Continue home medications, carb modified diet when she is about to eat and SSI.  5. Morbid obesity  -Patient weighs 384 pounds with calculated BMI of 66.1. 6. HTN hold diuretics, ACE while NPO   Code Status: full Family Communication: d/w patient (indicate person spoken with, relationship, and if by phone, the number) Disposition Plan: home 24-48 hours    Consultants:  GI  Procedures:  None   Antibiotics:  None  (indicate start date, and stop date if known)  HPI/Subjective: alert  Objective: Filed Vitals:   03/04/14 1041  BP: 149/78  Pulse:   Temp:   Resp:    No intake or output data in the 24 hours ending 03/04/14 1115 Filed Weights   03/02/14 0419  Weight: 174.4 kg (384 lb 7.7 oz)    Exam:   General:  alert  Cardiovascular: s1,s2 rrr  Respiratory: CTA BL  Abdomen: soft, nt,nd   Musculoskeletal: no pitting edema   Data Reviewed: Basic Metabolic Panel:  Recent Labs Lab 03/01/14 0908 03/02/14 0004 03/02/14 1220  NA 139 142 139  K 4.1 4.5 4.1  CL 107 106  103  CO2 '24 23 25  ' GLUCOSE 115* 128* 102*  BUN '11 14 10  ' CREATININE 0.61 0.60 0.58  CALCIUM 8.3* 8.8 8.7  MG  --   --  1.9  PHOS  --   --  3.1   Liver Function Tests:  Recent Labs Lab 03/01/14 0908 03/02/14 0004 03/02/14 1220  AST '15 19 15  ' ALT '10 11 10  ' ALKPHOS 70 83 79  BILITOT 0.3 <0.2* 0.4  PROT 6.5 7.0 6.8  ALBUMIN 3.6 3.1* 3.1*   No results found for this basename: LIPASE, AMYLASE,  in the last 168 hours No results found for this basename: AMMONIA,  in the last 168 hours CBC:  Recent Labs Lab 03/02/14 0004 03/02/14 1220 03/02/14 1330 03/02/14 1924 03/03/14 0202 03/03/14 0745  WBC 5.5 4.9 5.0 7.2 6.0 5.4  NEUTROABS 3.4  --   --   --   --   --   HGB 5.2* 6.6* 6.6* 8.2* 7.7* 8.4*  HCT 19.9* 23.4* 23.3* 28.7* 26.2* 28.9*  MCV 68.2* 70.9* 71.5* 72.1* 72.0* 72.4*  PLT 243 212 220 222 191 191   Cardiac Enzymes: No results found for this basename: CKTOTAL, CKMB, CKMBINDEX, TROPONINI,  in the last 168 hours BNP (last 3 results) No results found for this basename: PROBNP,  in the last 8760 hours CBG:  Recent Labs Lab 03/03/14 1705 03/03/14 1952 03/03/14 2345 03/04/14 0419 03/04/14 0758  GLUCAP 98 113* 111* 127* 98  No results found for this or any previous visit (from the past 240 hour(s)).   Studies: No results found.  Scheduled Meds: . amLODipine  10 mg Oral Daily  . antiseptic oral rinse  15 mL Mouth Rinse q12n4p  . bisacodyl  5 mg Oral BID  . chlorhexidine  15 mL Mouth Rinse BID  . insulin aspart  0-9 Units Subcutaneous 6 times per day  . pantoprazole  40 mg Oral Q0600  . [START ON 03/05/2014] peg 3350 powder  0.5 kit Oral Once  . sodium chloride  3 mL Intravenous Q12H   Continuous Infusions: . sodium chloride      Principal Problem:   Anemia Active Problems:   DIABETES MELLITUS, TYPE II   HYPERTENSION   Encounter for long-term (current) use of anticoagulants   Embolism, pulmonary with infarction   Sleep apnea with use of continuous  positive airway pressure (CPAP)   Symptomatic anemia   Melena    Time spent: >35 minutes     Kinnie Feil  [facility] Pager [phone]. If 7PM-7AM, please contact night-coverage at [email or web address], password [facility] 03/04/2014, 11:15 AM  LOS: 2 days

## 2014-03-04 NOTE — Progress Notes (Signed)
MEDICATION RELATED CONSULT NOTE - INITIAL   Pharmacy Consult for Iron dextran Indication: Iron deficiency anemia  No Known Allergies  Patient Measurements: Height: _0  (162.6 cm) Weight: 384 lb 7.7 oz (174.4 kg) IBW/kg (Calculated) : 54.7 Adjusted Body Weight: 91 kg  Vital Signs: Temp: 97.7 F (36.5 C) (03/26 1437) Temp src: Oral (03/26 1437) BP: 136/80 mmHg (03/26 1437) Pulse Rate: 79 (03/26 1437) Intake/Output from previous day: 03/25 0701 - 03/26 0700 In: 3 [I.V.:3] Out: -  Intake/Output from this shift:    Labs:  Recent Labs  03/02/14 0004 03/02/14 1220  03/02/14 1924 03/03/14 0202 03/03/14 0745  WBC 5.5 4.9  < > 7.2 6.0 5.4  HGB 5.2* 6.6*  < > 8.2* 7.7* 8.4*  HCT 19.9* 23.4*  < > 28.7* 26.2* 28.9*  PLT 243 212  < > 222 191 191  CREATININE 0.60 0.58  --   --   --   --   MG  --  1.9  --   --   --   --   PHOS  --  3.1  --   --   --   --   ALBUMIN 3.1* 3.1*  --   --   --   --   PROT 7.0 6.8  --   --   --   --   AST 19 15  --   --   --   --   ALT 11 10  --   --   --   --   ALKPHOS 83 79  --   --   --   --   BILITOT <0.2* 0.4  --   --   --   --   < > = values in this interval not displayed. Estimated Creatinine Clearance: 113.6 ml/min (by C-G formula based on Cr of 0.58).   Microbiology: No results found for this or any previous visit (from the past 720 hour(s)).  Medical History: Past Medical History  Diagnosis Date  . Iron deficiency anemia, unspecified     ice pica  . Other and unspecified hyperlipidemia   . Unspecified essential hypertension   . Retinal detachment   . Migraine, unspecified, without mention of intractable migraine without mention of status migrainosus   . Type II or unspecified type diabetes mellitus without mention of complication, not stated as uncontrolled   . Osteoarthrosis, unspecified whether generalized or localized, lower leg   . Morbid obesity   . Pulmonary embolus 2012  . GERD (gastroesophageal reflux disease)   .  Headache, migraine   . Chronic pain   . OSA (obstructive sleep apnea)   . Hyperlipidemia   . Arthritis     DDD and HNP on MRI in 2008  . Endometrial polyp 08/2010    biopsied.     Medications:  Prescriptions prior to admission  Medication Sig Dispense Refill  . ACCU-CHEK AVIVA PLUS test strip 1 each by Other route as needed.       Marland Kitchen ACCU-CHEK SOFTCLIX LANCETS lancets 1 each as needed.       Marland Kitchen acetaminophen (TYLENOL) 650 MG CR tablet Take 1,300 mg by mouth every 8 (eight) hours as needed for pain.      Marland Kitchen amLODipine (NORVASC) 10 MG tablet Take 1 tablet (10 mg total) by mouth daily.  30 tablet  6  . diphenhydramine-acetaminophen (TYLENOL PM) 25-500 MG TABS Take 1 tablet by mouth at bedtime as needed (for sleep).      Marland Kitchen  glucose monitoring kit (FREESTYLE) monitoring kit 1 each by Does not apply route as needed for other. Dispense any model that is covered- dispense testing supplies for Q AC/ HS accuchecks- 1 month supply with 6 refills.  1 each  1  . lisinopril (PRINIVIL,ZESTRIL) 10 MG tablet Take 1 tablet (10 mg total) by mouth daily.  30 tablet  6  . metFORMIN (GLUCOPHAGE-XR) 500 MG 24 hr tablet Take 1 tablet (500 mg total) by mouth daily with breakfast.  60 tablet  6  . nitroGLYCERIN (NITROSTAT) 0.4 MG SL tablet Place 0.4 mg under the tongue every 5 (five) minutes as needed.        Marland Kitchen omeprazole (PRILOSEC) 20 MG capsule Take 2 capsules (40 mg total) by mouth daily.  30 capsule  6  . warfarin (COUMADIN) 7.5 MG tablet Take 3.75-7.5 mg by mouth See admin instructions. Take 1 tablet by mouth on Monday, Wednesday, Friday.  Take 1/2 tablet (3.54m) by mouth on Tuesday, Thursday, Saturday, sunday        Assessment: 63y.o. female admitted with GIB. Pt also with noted IDA likely due to chronic blood loss. S/p PRBC x3 units 3/24, PRBC x 1 3/25. Hgb up to 8.4. Iron 27, sat ratio 6, ferritin 1.  Goal of Therapy:  Hgb >/= 12  Plan:  1. Iron dextran 273mtest dose IV x 1 2. If no reaction to test  dose, will give iron dextran 100084mV  3. Will f/u Hgb  CarSherlon HandingharmD, BCPS Clinical pharmacist, pager [phone] 106726/2015,4:10 PM

## 2014-03-04 NOTE — Care Management Note (Signed)
    Page 1 of 1   03/08/2014     6:13:18 PM   CARE MANAGEMENT NOTE 03/08/2014  Patient:  Brenda Clark,Brenda Clark   Account Number:  [id number]  Date Initiated:  03/04/2014  Documentation initiated by:  Letha CapeAYLOR,Onofre Gains  Subjective/Objective Assessment:   dx anemia  admit- lives alone ,has hh aide.     Action/Plan:   Anticipated DC Date:  03/05/2014   Anticipated DC Plan:  HOME W HOME HEALTH SERVICES      DC Planning Services  CM consult      Choice offered to / List presented to:             Status of service:  Completed, signed off Medicare Important Message given?   (If response is "NO", the following Medicare IM given date fields will be blank) Date Medicare IM given:   Date Additional Medicare IM given:    Discharge Disposition:  HOME/SELF CARE  Per UR Regulation:  Reviewed for med. necessity/level of care/duration of stay  If discussed at Long Length of Stay Meetings, dates discussed:    Comments:  02/22/14 1618 Letha Capeeborah Miller [place] RN, BSN [phone] NCM will continue to follow for dc needs.

## 2014-03-04 NOTE — Progress Notes (Signed)
Pt with complaint of pain in chest rating it a 4/10. Pt states nothing makes pain worse. Pt describes pain as burning or aching like she has gas. Pt states water & nitro makes pain better. Pt with VS of bp-126/71, t-98, 02 of 100% on room air, p-82, & r-18. RN paged provider  on call awaiting further orders.

## 2014-03-04 NOTE — Progress Notes (Signed)
RN called Endo to see when patient was on schedule today. Endo stated patient was not on schedule until tomorrow 3/27. Endo stated notification to Gribbin, PA-C was made because Moviprep was completed over night 3/26.

## 2014-03-04 NOTE — Progress Notes (Signed)
          Daily Rounding Note  03/04/2014, 8:48 AM  LOS: 2 days   SUBJECTIVE:       Staff on floor gave pt the bowel prep starting last night, completing second portion this AM.  Orders were mininterpreted, as prep was to start tonite.  Pt informed of the situation and very gracious in accepting the early prep.  Pt on for colonoscopy tomorrow at 1330 with Propofol.   OBJECTIVE:         Vital signs in last 24 hours:    Temp:  [97.9 F (36.6 C)-99.2 F (37.3 C)] 98.2 F (36.8 C) (03/26 0421) Pulse Rate:  [74-88] 79 (03/26 0421) Resp:  [18-20] 18 (03/26 0421) BP: (112-167)/(70-77) 121/70 mmHg (03/26 0421) SpO2:  [95 %-100 %] 95 % (03/26 0421) Last BM Date: 03/04/14 General: looks well, pleasant.   Heart: RRR Chest: clear bil.  No resting dyspnea Abdomen: obese, active BS, soft  Extremities: no CCE Neuro/Psych:  Pleasant, cooperative.   Intake/Output from previous day: 03/25 0701 - 03/26 0700 In: 3 [I.V.:3] Out: -   Intake/Output this shift:    Lab Results:  Recent Labs  03/02/14 1924 03/03/14 0202 03/03/14 0745  WBC 7.2 6.0 5.4  HGB 8.2* 7.7* 8.4*  HCT 28.7* 26.2* 28.9*  PLT 222 191 191   BMET  Recent Labs  03/01/14 0908 03/02/14 0004 03/02/14 1220  NA 139 142 139  K 4.1 4.5 4.1  CL 107 106 103  CO2 24 23 25   GLUCOSE 115* 128* 102*  BUN 11 14 10   CREATININE 0.61 0.60 0.58  CALCIUM 8.3* 8.8 8.7   LFT  Recent Labs  03/01/14 0908 03/02/14 0004 03/02/14 1220  PROT 6.5 7.0 6.8  ALBUMIN 3.6 3.1* 3.1*  AST 15 19 15   ALT 10 11 10   ALKPHOS 70 83 79  BILITOT 0.3 <0.2* 0.4   PT/INR  Recent Labs  03/03/14 1630 03/04/14 0640  LABPROT 20.6* 21.1*  INR 1.83* 1.89*    ASSESMENT:   * Anemia, iron deficient. . Ferritin1.  Hx IDA dating back to 05/2011. Improved post 4 PRBC. FOBT negative.  * PE/DVT 05/2011. Coumadin on hold but INR still therapeutic.  * OSA, morbid obesity  * Type 2 DM. On  Glucophage at home.     PLAN   *  Set up for EGD, colonoscopy tomorrow at 1330, with Propofol.  Will give one liter of moviprep tomorrow AM, clears today.  *  Should she receive parenteral iron before discharging home?   Jennye MoccasinSarah Gribbin  03/04/2014, 8:48 AM Pager: [phone]  [facility] Attending  I have also seen and assessed the patient and agree with the above note.  Agree with IV iron - ordered.  Colonoscopy tomorrow.  Iva Booparl E. Dwayna Kentner, MD, Antionette FairyFACG [facility] [phone] (pager) 03/04/2014 4:02 PM

## 2014-03-04 NOTE — Progress Notes (Signed)
Patient was due for moviprep late last night and this morning. The orders were no longer on RN's to do list my this morning. When RN opened moviprep orders, there was a note stating that the moviprep needs to be given tonight (3/26) and tomorrow morning (3/27). RN called the GI MD on call for clarification. RN received verbal orders to give the second half of the moviprep this morning at 0600am.

## 2014-03-04 NOTE — Progress Notes (Signed)
Patient has had her GI cocktail and is now asleep. Will continue to monitor

## 2014-03-05 ENCOUNTER — Inpatient Hospital Stay (HOSPITAL_COMMUNITY): Payer: PRIVATE HEALTH INSURANCE | Admitting: Certified Registered Nurse Anesthetist

## 2014-03-05 ENCOUNTER — Encounter (HOSPITAL_COMMUNITY)
Admission: EM | Disposition: A | Discharge status: Home / Self Care | Payer: Self-pay | Source: Home / Self Care | Attending: Internal Medicine

## 2014-03-05 ENCOUNTER — Encounter (HOSPITAL_COMMUNITY): Payer: Self-pay | Admitting: Anesthesiology

## 2014-03-05 ENCOUNTER — Encounter (HOSPITAL_COMMUNITY): Payer: PRIVATE HEALTH INSURANCE | Admitting: Certified Registered Nurse Anesthetist

## 2014-03-05 DIAGNOSIS — K259 Gastric ulcer, unspecified as acute or chronic, without hemorrhage or perforation: Secondary | ICD-10-CM

## 2014-03-05 DIAGNOSIS — K449 Diaphragmatic hernia without obstruction or gangrene: Secondary | ICD-10-CM

## 2014-03-05 DIAGNOSIS — K5730 Diverticulosis of large intestine without perforation or abscess without bleeding: Secondary | ICD-10-CM | POA: Diagnosis present

## 2014-03-05 HISTORY — PX: ESOPHAGOGASTRODUODENOSCOPY (EGD) WITH PROPOFOL: SHX5813

## 2014-03-05 HISTORY — PX: COLONOSCOPY WITH PROPOFOL: SHX5780

## 2014-03-05 HISTORY — DX: Diaphragmatic hernia without obstruction or gangrene: K44.9

## 2014-03-05 HISTORY — DX: Gastric ulcer, unspecified as acute or chronic, without hemorrhage or perforation: K25.9

## 2014-03-05 LAB — CBC
HEMATOCRIT: 26.6 % — AB (ref 36.0–46.0)
HEMOGLOBIN: 7.7 g/dL — AB (ref 12.0–15.0)
MCH: 21.3 pg — ABNORMAL LOW (ref 26.0–34.0)
MCHC: 28.9 g/dL — ABNORMAL LOW (ref 30.0–36.0)
MCV: 73.5 fL — AB (ref 78.0–100.0)
PLATELETS: 149 10*3/uL — AB (ref 150–400)
RBC: 3.62 MIL/uL — AB (ref 3.87–5.11)
RDW: 22.3 % — ABNORMAL HIGH (ref 11.5–15.5)
WBC: 4.9 10*3/uL (ref 4.0–10.5)

## 2014-03-05 LAB — GLUCOSE, CAPILLARY
GLUCOSE-CAPILLARY: 110 mg/dL — AB (ref 70–99)
Glucose-Capillary: 108 mg/dL — ABNORMAL HIGH (ref 70–99)
Glucose-Capillary: 112 mg/dL — ABNORMAL HIGH (ref 70–99)
Glucose-Capillary: 117 mg/dL — ABNORMAL HIGH (ref 70–99)
Glucose-Capillary: 123 mg/dL — ABNORMAL HIGH (ref 70–99)
Glucose-Capillary: 89 mg/dL (ref 70–99)

## 2014-03-05 LAB — TROPONIN I: Troponin I: <0.3 ng/mL (ref <0.30)

## 2014-03-05 SURGERY — COLONOSCOPY WITH PROPOFOL
Anesthesia: Monitor Anesthesia Care

## 2014-03-05 MED ORDER — ONDANSETRON HCL 4 MG/2ML IJ SOLN: 4.0000 mg | Freq: Once | INTRAMUSCULAR | Status: AC | PRN

## 2014-03-05 MED ORDER — ACETAMINOPHEN 160 MG/5ML PO SOLN: 325.0000 mg | ORAL | Status: DC | PRN

## 2014-03-05 MED ORDER — FENTANYL CITRATE 0.05 MG/ML IJ SOLN: 25.0000 ug | INTRAMUSCULAR | Status: DC | PRN

## 2014-03-05 MED ORDER — WARFARIN SODIUM 7.5 MG PO TABS
7.5000 mg | ORAL_TABLET | Freq: Once | ORAL | Status: AC
  Administered 2014-03-05: 7.5 mg via ORAL
  Filled 2014-03-05: qty 1

## 2014-03-05 MED ORDER — SODIUM CHLORIDE 0.9 % IV SOLN
INTRAVENOUS | Status: DC | PRN
  Administered 2014-03-05: 14:00:00 via INTRAVENOUS

## 2014-03-05 MED ORDER — PROPOFOL INFUSION 10 MG/ML OPTIME
INTRAVENOUS | Status: DC | PRN
  Administered 2014-03-05: 150 ug/kg/min via INTRAVENOUS

## 2014-03-05 MED ORDER — ACETAMINOPHEN 325 MG PO TABS: 325.0000 mg | ORAL_TABLET | ORAL | Status: DC | PRN

## 2014-03-05 MED ORDER — WARFARIN - PHARMACIST DOSING INPATIENT: Freq: Every day | Status: DC

## 2014-03-05 MED ORDER — INSULIN ASPART 100 UNIT/ML ~~LOC~~ SOLN: 0.0000 [IU] | Freq: Three times a day (TID) | SUBCUTANEOUS | Status: DC

## 2014-03-05 NOTE — Op Note (Signed)
[facility] [address] [place], [id number]   COLONOSCOPY PROCEDURE REPORT  PATIENT: Brenda Clark, Brenda M.  MR#: [id number] BIRTHDATE: 02/19/1951 , 63  yrs. old GENDER: Female ENDOSCOPIST: Iva Booparl E Katlyn Muldrew, MD, [facility] PROCEDURE DATE:  03/05/2014 PROCEDURE:   Colonoscopy, diagnostic First Screening Colonoscopy - Avg.  risk and is 50 yrs.  old or older - No.  Prior Negative Screening - Now for repeat screening. N/A  History of Adenoma - Now for follow-up colonoscopy & has been > or = to 3 yrs.  N/A  Polyps Removed Today? No.  Recommend repeat exam, <10 yrs? No. ASA CLASS:   Class III INDICATIONS:Iron Deficiency Anemia. MEDICATIONS: See Anesthesia Report.  DESCRIPTION OF PROCEDURE:   After the risks benefits and alternatives of the procedure were thoroughly explained, informed consent was obtained.  A digital rectal exam revealed no abnormalities of the rectum.   The     endoscope was introduced through the anus and advanced to the cecum, which was identified by both the appendix and ileocecal valve. No adverse events experienced.   The quality of the prep was good, using MoviPrep The instrument was then slowly withdrawn as the colon was fully examined.  COLON FINDINGS: Moderate diverticulosis was noted in the sigmoid colon.   The colon mucosa was otherwise normal.   A right colon retroflexion was performed.  Retroflexed views revealed no abnormalities. The time to cecum=2 minutes 0 seconds.  Withdrawal time=6 minutes 0 seconds.  The scope was withdrawn and the procedure completed. COMPLICATIONS: There were no complications.  ENDOSCOPIC IMPRESSION: 1.   Moderate diverticulosis was noted in the sigmoid colon 2.   The colon mucosa was otherwise normal  RECOMMENDATIONS: Iron supplements forever and monitoring of CBC to keep up with low-grade chronic blood loss from [facility] erosions/hiatal hernia seen at EGD. Resume warfarin (ordered) See GI  prn   eSigned:  Iva Booparl E Harvy Riera, MD, [facility] 03/05/2014 2:48 PM

## 2014-03-05 NOTE — Op Note (Signed)
[facility] [address] [place], [id number]   ENDOSCOPY PROCEDURE REPORT  PATIENT: Brenda Clark, Brenda M.  MR#: [id number] BIRTHDATE: 1951-12-09 , 63  yrs. old GENDER: Female ENDOSCOPIST: Iva Booparl E Tristine Langi, MD, [facility] PROCEDURE DATE:  03/05/2014 PROCEDURE:  EGD, diagnostic ASA CLASS:     Class III INDICATIONS:  Iron deficiency anemia. MEDICATIONS: See Anesthesia Report. TOPICAL ANESTHETIC: none  DESCRIPTION OF PROCEDURE: After the risks benefits and alternatives of the procedure were thoroughly explained, informed consent was obtained.  The Pentax Gastroscope [id number] endoscope was introduced through the mouth and advanced to the second portion of the duodenum. Without limitations.  The instrument was slowly withdrawn as the mucosa was fully examined.        STOMACH: A large hiatal hernia was noted.   10-12 cm with several erosions at diaphragmatic impingement Sheria Lang(Cameron eroisons)  The remainder of the upper endoscopy exam was otherwise normal. Retroflexed views revealed a hiatal hernia.     The scope was then withdrawn from the patient and the procedure completed.  COMPLICATIONS: There were no complications. ENDOSCOPIC IMPRESSION: 1.   Large hiatal hernia with Sheria Langameron erosions 2.   The remainder of the upper endoscopy exam was otherwise normal  RECOMMENDATIONS: 1.  Continue PPI 2.  Proceed with a Colonoscopy. 3.   Will need reg CBC, iron supplementation to keep up with chronic low-grade blood loss from cameron erosions/hiatal hernia - FOREVER   eSigned:  Iva Booparl E Owais Pruett, MD, [facility] 03/05/2014 2:45 PM   CC:  PATIENT NAME:  Brenda Clark, Brenda M. MR#: [id number]

## 2014-03-05 NOTE — Progress Notes (Signed)
Patient has refused CPAP. There is no machine in the room. Patient knows to call RT if she does change her mind. RT will continue to assist as needed.

## 2014-03-05 NOTE — Transfer of Care (Addendum)
Immediate Anesthesia Transfer of Care Note  Patient: Brenda Clark  Procedure(s) Performed: Procedure(s): COLONOSCOPY WITH PROPOFOL (N/A) ESOPHAGOGASTRODUODENOSCOPY (EGD) WITH PROPOFOL (N/A)  Patient Location: Endoscopy Unit  Anesthesia Type:MAC  Level of Consciousness: awake, alert  and oriented  Airway & Oxygen Therapy: Patient Spontanous Breathing and Patient connected to nasal cannula oxygen  Post-op Assessment: Report given to PACU RN, Post -op Vital signs reviewed and stable and Patient moving all extremities  Post vital signs: Reviewed and stable  Complications: No apparent anesthesia complications

## 2014-03-05 NOTE — Progress Notes (Signed)
ANTICOAGULATION CONSULT NOTE - Initial Consult  Pharmacy Consult for Warfarin Indication: Hx PE   No Known Allergies  Patient Measurements: Height: 5\' 4"  (162.6 cm) Weight: 384 lb 7.7 oz (174.4 kg) IBW/kg (Calculated) : 54.7  Vital Signs: Temp: 97.8 F (36.6 C) (03/27 1430) Temp src: Oral (03/27 1430) BP: 148/58 mmHg (03/27 1303) Pulse Rate: 79 (03/27 1303)  Labs:  Recent Labs  03/03/14 0202 03/03/14 0745 03/03/14 1630 03/04/14 0640 03/05/14 0532  HGB 7.7* 8.4*  --   --  7.7*  HCT 26.2* 28.9*  --   --  26.6*  PLT 191 191  --   --  149*  LABPROT  --   --  20.6* 21.1*  --   INR  --   --  1.83* 1.89*  --   TROPONINI  --   --   --   --  <0.30    Estimated Creatinine Clearance: 113.6 ml/min (by C-G formula based on Cr of 0.58).   Medical History: Past Medical History  Diagnosis Date  . Iron deficiency anemia, unspecified     ice pica  . Other and unspecified hyperlipidemia   . Unspecified essential hypertension   . Retinal detachment   . Migraine, unspecified, without mention of intractable migraine without mention of status migrainosus   . Type II or unspecified type diabetes mellitus without mention of complication, not stated as uncontrolled   . Osteoarthrosis, unspecified whether generalized or localized, lower leg   . Morbid obesity   . Pulmonary embolus 2012  . GERD (gastroesophageal reflux disease)   . Headache, migraine   . Chronic pain   . OSA (obstructive sleep apnea)   . Hyperlipidemia   . Arthritis     DDD and HNP on MRI in 2008  . Endometrial polyp 08/2010    biopsied.   . Hiatal hernia 03/05/2014  . 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 F on warfarin PTA for hx PE -- however held on admit due to anemia and r/o GIB. The patient is now s/p EGD/colonoscopy that revealed a large hiatal hernia and moderate diverticulitis. Per GI, the patient will need continued iron supplementation and monitoring of CBC to keep up with chronic blood loss  -- however the patient is safe to continue warfarin.   INR down to 1.89 on 3/26 and not checked yet today (3/27) however can assume has trended down further. PTA the patient was known to be taking 3.75 mg daily EXCEPT for 7.5 mg on MWF. Hgb 7.7 -- last PRBC given on 3/24, iron replaced on 3/26.   Goal of Therapy:  INR 2-3   Plan:  1. Warfarin 7.5 mg x 1 dose at 1800 today 2. Daily PT/INR 3. Will continue to monitor for any signs/symptoms of bleeding and will follow up with PT/INR in the a.m.   Georgina PillionElizabeth Shoaib Siefker, PharmD, BCPS Clinical Pharmacist Pager: [phone] 03/05/2014 3:06 PM

## 2014-03-05 NOTE — Progress Notes (Signed)
[facility] PROGRESS NOTE  Brenda Clark GNF:[id number] DOB: [birth year] DOA: 03/02/2014 PCP: Dorrene German, MD  Assessment/Plan: Principal Problem:  Anemia  Active Problems:  DIABETES MELLITUS, TYPE II  HYPERTENSION  Encounter for long-term (current) use of anticoagulants  Embolism, pulmonary with infarction  Sleep apnea with use of continuous positive airway pressure (CPAP)  Symptomatic anemia  Melena   63 y/o female with PMH of HTN, DM, h/o DVT on coumadin is admitted with symptomatic IDA/anemia GIB   1. 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 GI bleed In settings of anticoagulation, Coumadin held.  -EGD/colon per GI on 3/27 3. History of PE 2012 from L leg DVT; completed treatment but was not told to stop coumadin  -no s/s of DVT, or PE; [place] legs negative for DVT; echo (2014): normal LV, RV function; hold anticoagulation for now  4. Diabetes mellitus type 2  -Controlled diabetes mellitus with hemoglobin A1c of 6.0. ISS for now  5. Morbid obesity  -Patient weighs 384 pounds with calculated BMI of 66.1. 6. HTN hold diuretics, ACE while NPO   Code Status: full Family Communication: d/w patient (indicate person spoken with, relationship, and if by phone, the number) Disposition Plan: home 24-48 hours    Consultants:  GI  Procedures:  None   Antibiotics:  None  (indicate start date, and stop date if known)  HPI/Subjective: alert  Objective: Filed Vitals:   03/05/14 0935  BP: 142/65  Pulse:   Temp:   Resp:     Intake/Output Summary (Last 24 hours) at 03/05/14 1111 Last data filed at 03/05/14 1030  Gross per 24 hour  Intake      3 ml  Output      0 ml  Net      3 ml   Filed Weights   03/02/14 0419  Weight: 174.4 kg (384 lb 7.7 oz)    Exam:   General:  alert  Cardiovascular: s1,s2 rrr  Respiratory: CTA BL  Abdomen: soft, nt,nd   Musculoskeletal: no pitting edema   Data  Reviewed: Basic Metabolic Panel:  Recent Labs Lab 03/01/14 0908 03/02/14 0004 03/02/14 1220  NA 139 142 139  K 4.1 4.5 4.1  CL 107 106 103  CO2 24 23 25   GLUCOSE 115* 128* 102*  BUN 11 14 10   CREATININE 0.61 0.60 0.58  CALCIUM 8.3* 8.8 8.7  MG  --   --  1.9  PHOS  --   --  3.1   Liver Function Tests:  Recent Labs Lab 03/01/14 0908 03/02/14 0004 03/02/14 1220  AST 15 19 15   ALT 10 11 10   ALKPHOS 70 83 79  BILITOT 0.3 <0.2* 0.4  PROT 6.5 7.0 6.8  ALBUMIN 3.6 3.1* 3.1*   No results found for this basename: LIPASE, AMYLASE,  in the last 168 hours No results found for this basename: AMMONIA,  in the last 168 hours CBC:  Recent Labs Lab 03/02/14 0004  03/02/14 1330 03/02/14 1924 03/03/14 0202 03/03/14 0745 03/05/14 0532  WBC 5.5  < > 5.0 7.2 6.0 5.4 4.9  NEUTROABS 3.4  --   --   --   --   --   --   HGB 5.2*  < > 6.6* 8.2* 7.7* 8.4* 7.7*  HCT 19.9*  < > 23.3* 28.7* 26.2* 28.9* 26.6*  MCV 68.2*  < > 71.5* 72.1* 72.0* 72.4* 73.5*  PLT 243  < >  220 222 191 191 149*  < > = values in this interval not displayed. Cardiac Enzymes:  Recent Labs Lab 03/05/14 0532  TROPONINI <0.30   BNP (last 3 results) No results found for this basename: PROBNP,  in the last 8760 hours CBG:  Recent Labs Lab 03/04/14 1600 03/04/14 2019 03/05/14 0001 03/05/14 0407 03/05/14 0753  GLUCAP 107* 112* 108* 117* 112*    No results found for this or any previous visit (from the past 240 hour(s)).   Studies: No results found.  Scheduled Meds: . amLODipine  10 mg Oral Daily  . antiseptic oral rinse  15 mL Mouth Rinse q12n4p  . chlorhexidine  15 mL Mouth Rinse BID  . insulin aspart  0-9 Units Subcutaneous 6 times per day  . pantoprazole  40 mg Oral Q0600  . sodium chloride  3 mL Intravenous Q12H   Continuous Infusions: . sodium chloride      Principal Problem:   Anemia Active Problems:   DIABETES MELLITUS, TYPE II   HYPERTENSION   Encounter for long-term (current) use  of anticoagulants   Embolism, pulmonary with infarction   Sleep apnea with use of continuous positive airway pressure (CPAP)   Symptomatic anemia   Melena    Time spent: >35 minutes     Esperanza SheetsBURIEV, Matylda Fehring N  [facility] Pager [phone]. If 7PM-7AM, please contact night-coverage at [email or web address], password [email or web address] 03/05/2014, 11:11 AM  LOS: 3 days

## 2014-03-05 NOTE — Anesthesia Postprocedure Evaluation (Signed)
  Anesthesia Post-op Note  Patient: Brenda Clark  Procedure(s) Performed: Procedure(s): COLONOSCOPY WITH PROPOFOL (N/A) ESOPHAGOGASTRODUODENOSCOPY (EGD) WITH PROPOFOL (N/A)  Patient Location: PACU  Anesthesia Type:MAC  Level of Consciousness: awake, alert  and oriented  Airway and Oxygen Therapy: Patient Spontanous Breathing  Post-op Pain: none  Post-op Assessment: Post-op Vital signs reviewed, Patient's Cardiovascular Status Stable, Respiratory Function Stable, Patent Airway, No signs of Nausea or vomiting and Pain level controlled  Post-op Vital Signs: Reviewed and stable  Complications: No apparent anesthesia complications

## 2014-03-05 NOTE — Anesthesia Preprocedure Evaluation (Addendum)
Anesthesia Evaluation  Patient identified by MRN, date of birth, ID band Patient awake    Reviewed: Allergy & Precautions, H&P , NPO status , Patient's Chart, lab work & pertinent test results  History of Anesthesia Complications Negative for: history of anesthetic complications  Airway Mallampati: I TM Distance: >3 FB Neck ROM: Full    Dental  (+) Dental Advisory Given, Missing,    Pulmonary shortness of breath and with exertion, sleep apnea and Continuous Positive Airway Pressure Ventilation , neg COPDPE (2012- last dose of Coumadin 03/01/14)         Cardiovascular hypertension, Pt. on medications - angina- Past MI and - CHF - dysrhythmias + Valvular Problems/Murmurs MR Rhythm:Regular Rate:Normal + Systolic murmurs    Neuro/Psych  Headaches, negative psych ROS   GI/Hepatic Bowel prep,GERD-  Medicated and Controlled,Possible gi bleed,    Endo/Other  diabetes (CBG 110), Type 2, Oral Hypoglycemic AgentsMorbid obesity  Renal/GU      Musculoskeletal  (+) Arthritis -,   Abdominal   Peds  Hematology  (+) Blood dyscrasia, anemia ,   Anesthesia Other Findings   Reproductive/Obstetrics                      Anesthesia Physical Anesthesia Plan  ASA: III  Anesthesia Plan: MAC   Post-op Pain Management:    Induction:   Airway Management Planned: Nasal Cannula and Natural Airway  Additional Equipment: None  Intra-op Plan:   Post-operative Plan:   Informed Consent: I have reviewed the patients History and Physical, chart, labs and discussed the procedure including the risks, benefits and alternatives for the proposed anesthesia with the patient or authorized representative who has indicated his/her understanding and acceptance.   Dental advisory given  Plan Discussed with: CRNA and Anesthesiologist  Anesthesia Plan Comments:       Anesthesia Quick Evaluation

## 2014-03-06 LAB — PREPARE RBC (CROSSMATCH)

## 2014-03-06 LAB — CBC
HCT: 27 % — ABNORMAL LOW (ref 36.0–46.0)
HEMOGLOBIN: 7.8 g/dL — AB (ref 12.0–15.0)
MCH: 21.5 pg — AB (ref 26.0–34.0)
MCHC: 28.9 g/dL — ABNORMAL LOW (ref 30.0–36.0)
MCV: 74.6 fL — AB (ref 78.0–100.0)
Platelets: 141 10*3/uL — ABNORMAL LOW (ref 150–400)
RBC: 3.62 MIL/uL — AB (ref 3.87–5.11)
RDW: 23.1 % — ABNORMAL HIGH (ref 11.5–15.5)
WBC: 4.9 10*3/uL (ref 4.0–10.5)

## 2014-03-06 LAB — GLUCOSE, CAPILLARY
GLUCOSE-CAPILLARY: 90 mg/dL (ref 70–99)
GLUCOSE-CAPILLARY: 117 mg/dL — AB (ref 70–99)
Glucose-Capillary: 107 mg/dL — ABNORMAL HIGH (ref 70–99)
Glucose-Capillary: 98 mg/dL (ref 70–99)

## 2014-03-06 LAB — PROTIME-INR
INR: 1.75 — ABNORMAL HIGH (ref 0.00–1.49)
Prothrombin Time: 19.9 seconds — ABNORMAL HIGH (ref 11.6–15.2)

## 2014-03-06 MED ORDER — ACETAMINOPHEN 325 MG PO TABS
650.0000 mg | ORAL_TABLET | Freq: Once | ORAL | Status: AC
  Administered 2014-03-06: 650 mg via ORAL
  Filled 2014-03-06: qty 2

## 2014-03-06 MED ORDER — DIPHENHYDRAMINE HCL 50 MG/ML IJ SOLN
25.0000 mg | Freq: Once | INTRAMUSCULAR | Status: AC
  Administered 2014-03-06: 25 mg via INTRAVENOUS
  Filled 2014-03-06: qty 1

## 2014-03-06 MED ORDER — WARFARIN SODIUM 7.5 MG PO TABS
7.5000 mg | ORAL_TABLET | Freq: Once | ORAL | Status: AC
Start: 2014-03-06 — End: 2014-03-06
  Administered 2014-03-06: 7.5 mg via ORAL
  Filled 2014-03-06: qty 1

## 2014-03-06 MED ORDER — FERROUS SULFATE 325 (65 FE) MG PO TABS
325.0000 mg | ORAL_TABLET | Freq: Three times a day (TID) | ORAL | Status: DC
  Administered 2014-03-07 (×2): 325 mg via ORAL
  Filled 2014-03-06 (×4): qty 1

## 2014-03-06 MED ORDER — FUROSEMIDE 10 MG/ML IJ SOLN
INTRAMUSCULAR | Status: AC
  Administered 2014-03-06: 20 mg
  Filled 2014-03-06: qty 4

## 2014-03-06 MED ORDER — FUROSEMIDE 10 MG/ML IJ SOLN
20.0000 mg | Freq: Once | INTRAMUSCULAR | Status: AC
  Administered 2014-03-06: 20 mg via INTRAVENOUS
  Filled 2014-03-06: qty 2

## 2014-03-06 NOTE — Progress Notes (Signed)
PATIENT DETAILS Name: Brenda Clark Age: 63 y.o. Sex: female Date of Birth: 19-Feb-1951 Admit Date: 03/02/2014 Admitting Physician Therisa Doyne, MD WUJ:[id number],WGNFA A, MD  Subjective: No major complaints.  Assessment/Plan: Principal Problem:   Anemia -secondary to chronic blood loss from [facility] lesions -s/p 3 unit PRBCs, will transfuse 1 more unit today -spoke with Dr Leone Payor, ok to restart anticoagulation-as bleeding is chronic and low grade blood loss-as long as patient cant follow up with PCP and do regular CBC every 1-2 month -c/w PPI, will need long term Fe supplementations -d/w at length at bedside-explained above plan-she is agreeable  Active Problems: Chronic GI Bleed -Colonoscopy on 3/27 showed diverticulosis, EGD on 3/27 showed cameron lesions. -c/w PPI, will need to be on chronic Fe supplementation  Hx of DVT/PE -INR subtherapeutic-Pharmacy managing coumadin -no s/s of DVT, or PE; [place] legs negative for DVT; echo (2014): normal LV, RV function  Diabetes mellitus type 2  -Controlled diabetes mellitus with hemoglobin A1c of 6.0.  -c/w SSI for now, resume Metformin on discharge  HTN -controlled -c/w Amlodipine  Morbid Obesity -counseled regarding weight loss  Disposition: Remain inpatient  DVT Prophylaxis: Not needed as on coumadin  Code Status: Full code   Family Communication None at bedside  Procedures:  EGD/Colonoscopy 3/27  CONSULTS:  GI  MEDICATIONS: Scheduled Meds: . amLODipine  10 mg Oral Daily  . antiseptic oral rinse  15 mL Mouth Rinse q12n4p  . chlorhexidine  15 mL Mouth Rinse BID  . insulin aspart  0-9 Units Subcutaneous TID WC  . pantoprazole  40 mg Oral Q0600  . sodium chloride  3 mL Intravenous Q12H  . warfarin  7.5 mg Oral ONCE-1800  . Warfarin - Pharmacist Dosing Inpatient   Does not apply q1800   Continuous Infusions:  PRN Meds:.acetaminophen (TYLENOL) oral liquid 160 mg/5 mL, acetaminophen, acetaminophen,  acetaminophen, fentaNYL, HYDROcodone-acetaminophen, ondansetron (ZOFRAN) IV, ondansetron  Antibiotics: Anti-infectives   None       PHYSICAL EXAM: Vital signs in last 24 hours: Filed Vitals:   03/05/14 2213 03/05/14 2244 03/06/14 0520 03/06/14 0549  BP: 121/62  123/56 128/62  Pulse: 86 87 81   Temp: 98.2 F (36.8 C)  98.8 F (37.1 C)   TempSrc: Oral  Oral   Resp: 18 18 20    Height:      Weight:      SpO2: 95% 95% 95%     Weight change:  Filed Weights   03/02/14 0419  Weight: 174.4 kg (384 lb 7.7 oz)   Body mass index is 65.96 kg/(m^2).   Gen Exam: Awake and alert with clear speech.   Neck: Supple, No JVD.   Chest: B/L Clear.   CVS: S1 S2 Regular, no murmurs.  Abdomen: soft, BS +, non tender, non distended.  Extremities: no edema, lower extremities warm to touch. Neurologic: Non Focal.   Skin: No Rash.   Wounds: N/A.   Intake/Output from previous day:  Intake/Output Summary (Last 24 hours) at 03/06/14 1355 Last data filed at 03/05/14 2250  Gross per 24 hour  Intake    360 ml  Output      0 ml  Net    360 ml     LAB RESULTS: CBC  Recent Labs Lab 03/02/14 0004  03/02/14 1924 03/03/14 0202 03/03/14 0745 03/05/14 0532 03/06/14 0610  WBC 5.5  < > 7.2 6.0 5.4 4.9 4.9  HGB 5.2*  < > 8.2* 7.7* 8.4* 7.7*  7.8*  HCT 19.9*  < > 28.7* 26.2* 28.9* 26.6* 27.0*  PLT 243  < > 222 191 191 149* 141*  MCV 68.2*  < > 72.1* 72.0* 72.4* 73.5* 74.6*  MCH 17.8*  < > 20.6* 21.2* 21.1* 21.3* 21.5*  MCHC 26.1*  < > 28.6* 29.4* 29.1* 28.9* 28.9*  RDW 21.8*  < > 21.1* 21.0* 20.9* 22.3* 23.1*  LYMPHSABS 1.5  --   --   --   --   --   --   MONOABS 0.4  --   --   --   --   --   --   EOSABS 0.1  --   --   --   --   --   --   BASOSABS 0.1  --   --   --   --   --   --   < > = values in this interval not displayed.  Chemistries   Recent Labs Lab 03/01/14 0908 03/02/14 0004 03/02/14 1220  NA 139 142 139  K 4.1 4.5 4.1  CL 107 106 103  CO2 24 23 25   GLUCOSE 115* 128*  102*  BUN 11 14 10   CREATININE 0.61 0.60 0.58  CALCIUM 8.3* 8.8 8.7  MG  --   --  1.9    CBG:  Recent Labs Lab 03/05/14 1141 03/05/14 1654 03/05/14 1951 03/06/14 0750 03/06/14 1150  GLUCAP 110* 89 123* 107* 90    GFR Estimated Creatinine Clearance: 113.6 ml/min (by C-G formula based on Cr of 0.58).  Coagulation profile  Recent Labs Lab 03/01/14 1227 03/02/14 0111 03/03/14 1630 03/04/14 0640 03/06/14 0610  INR 2.6 2.38* 1.83* 1.89* 1.75*    Cardiac Enzymes  Recent Labs Lab 03/05/14 0532  TROPONINI <0.30    No components found with this basename: POCBNP,  No results found for this basename: DDIMER,  in the last 72 hours No results found for this basename: HGBA1C,  in the last 72 hours No results found for this basename: CHOL, HDL, LDLCALC, TRIG, CHOLHDL, LDLDIRECT,  in the last 72 hours No results found for this basename: TSH, T4TOTAL, FREET3, T3FREE, THYROIDAB,  in the last 72 hours No results found for this basename: VITAMINB12, FOLATE, FERRITIN, TIBC, IRON, RETICCTPCT,  in the last 72 hours No results found for this basename: LIPASE, AMYLASE,  in the last 72 hours  Urine Studies No results found for this basename: UACOL, UAPR, USPG, UPH, UTP, UGL, UKET, UBIL, UHGB, UNIT, UROB, ULEU, UEPI, UWBC, URBC, UBAC, CAST, CRYS, UCOM, BILUA,  in the last 72 hours  MICROBIOLOGY: No results found for this or any previous visit (from the past 240 hour(s)).  RADIOLOGY STUDIES/RESULTS: No results found.  Jeoffrey MassedGHIMIRE,Czarina Gingras, MD  [facility] Pager:[phone]  If 7PM-7AM, please contact night-coverage [email or web address] Password [email or web address] 03/06/2014, 1:55 PM   LOS: 4 days

## 2014-03-06 NOTE — Progress Notes (Signed)
1 unit of blood given to patient. Patient tolerated well. Denies chills, fever, nausea, or HA. Patient is in chair at this time watching tv. Will continue to monitor

## 2014-03-06 NOTE — Progress Notes (Signed)
ANTICOAGULATION CONSULT NOTE - Initial Consult  Pharmacy Consult for Warfarin Indication: Hx PE   No Known Allergies  Patient Measurements: Height: 5\' 4"  (162.6 cm) Weight: 384 lb 7.7 oz (174.4 kg) IBW/kg (Calculated) : 54.7  Vital Signs: Temp: 98.8 F (37.1 C) (03/28 0520) Temp src: Oral (03/28 0520) BP: 128/62 mmHg (03/28 0549) Pulse Rate: 81 (03/28 0520)  Labs:  Recent Labs  03/03/14 1630 03/04/14 0640 03/05/14 0532 03/06/14 0610  HGB  --   --  7.7* 7.8*  HCT  --   --  26.6* 27.0*  PLT  --   --  149* 141*  LABPROT 20.6* 21.1*  --  19.9*  INR 1.83* 1.89*  --  1.75*  TROPONINI  --   --  <0.30  --     Estimated Creatinine Clearance: 113.6 ml/min (by C-G formula based on Cr of 0.58).   Medical History: Past Medical History  Diagnosis Date  . Iron deficiency anemia, unspecified     ice pica  . Other and unspecified hyperlipidemia   . Unspecified essential hypertension   . Retinal detachment   . Migraine, unspecified, without mention of intractable migraine without mention of status migrainosus   . Type II or unspecified type diabetes mellitus without mention of complication, not stated as uncontrolled   . Osteoarthrosis, unspecified whether generalized or localized, lower leg   . Morbid obesity   . Pulmonary embolus 2012  . GERD (gastroesophageal reflux disease)   . Headache, migraine   . Chronic pain   . OSA (obstructive sleep apnea)   . Hyperlipidemia   . Arthritis     DDD and HNP on MRI in 2008  . Endometrial polyp 08/2010    biopsied.   . Hiatal hernia 03/05/2014  . 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 F on warfarin PTA for hx PE -- however held on admit due to anemia and r/o GIB. The patient is now s/p EGD/colonoscopy that revealed a large hiatal hernia and moderate diverticulitis. Per GI, the patient will need continued iron supplementation and monitoring of CBC to keep up with chronic blood loss -- however the patient is safe to continue  warfarin.   The INR today remains SUBtherapeutic (INR 1.75 << 1.89, goal of 2-3) PTA the patient was known to be taking 3.75 mg daily EXCEPT for 7.5 mg on MWF. Hgb 7.7 -- last PRBC given on 3/24, iron replaced on 3/26.   Goal of Therapy:  INR 2-3   Plan:  1. Repeat Warfarin 7.5 mg x 1 dose at 1800 today 2. Daily PT/INR 3. Will continue to monitor for any signs/symptoms of bleeding and will follow up with PT/INR in the a.m.   Georgina PillionElizabeth Turki Tapanes, PharmD, BCPS Clinical Pharmacist Pager: [phone] 03/06/2014 11:24 AM

## 2014-03-07 DIAGNOSIS — K259 Gastric ulcer, unspecified as acute or chronic, without hemorrhage or perforation: Secondary | ICD-10-CM

## 2014-03-07 LAB — GLUCOSE, CAPILLARY
Glucose-Capillary: 93 mg/dL (ref 70–99)
Glucose-Capillary: 99 mg/dL (ref 70–99)

## 2014-03-07 LAB — CBC
HCT: 32.5 % — ABNORMAL LOW (ref 36.0–46.0)
Hemoglobin: 9.5 g/dL — ABNORMAL LOW (ref 12.0–15.0)
MCH: 22 pg — ABNORMAL LOW (ref 26.0–34.0)
MCHC: 29.2 g/dL — ABNORMAL LOW (ref 30.0–36.0)
MCV: 75.4 fL — AB (ref 78.0–100.0)
PLATELETS: 117 10*3/uL — AB (ref 150–400)
RBC: 4.31 MIL/uL (ref 3.87–5.11)
RDW: 22.9 % — ABNORMAL HIGH (ref 11.5–15.5)
WBC: 5.4 10*3/uL (ref 4.0–10.5)

## 2014-03-07 LAB — PROTIME-INR
INR: 1.59 — AB (ref 0.00–1.49)
Prothrombin Time: 18.5 seconds — ABNORMAL HIGH (ref 11.6–15.2)

## 2014-03-07 MED ORDER — WARFARIN SODIUM 10 MG PO TABS
10.0000 mg | ORAL_TABLET | Freq: Once | ORAL | Status: DC
  Filled 2014-03-07: qty 1

## 2014-03-07 MED ORDER — FERROUS SULFATE 325 (65 FE) MG PO TABS: 325.0000 mg | ORAL_TABLET | Freq: Three times a day (TID) | ORAL | Status: DC

## 2014-03-07 MED ORDER — WARFARIN SODIUM 7.5 MG PO TABS: 7.5000 mg | ORAL_TABLET | ORAL | Status: DC

## 2014-03-07 NOTE — Progress Notes (Signed)
Patient is discharged home. All questions were answered and AVS given. Will continue to monitor.  Keilan Nichol J. Lendell CapriceSullivan RN

## 2014-03-07 NOTE — Telephone Encounter (Signed)
Need to ask Epic team to remove this, some weird glitch makes it so I am unable to make this into an erroneous encounter. -sh

## 2014-03-07 NOTE — Progress Notes (Signed)
ANTICOAGULATION CONSULT NOTE - Initial Consult  Pharmacy Consult for Warfarin Indication: Hx PE   No Known Allergies  Patient Measurements: Height: 5\' 4"  (162.6 cm) Weight: 384 lb 7.7 oz (174.4 kg) IBW/kg (Calculated) : 54.7  Vital Signs: Temp: 99.1 F (37.3 C) (03/29 0548) Temp src: Oral (03/29 0548) BP: 113/57 mmHg (03/29 0622) Pulse Rate: 88 (03/29 0548)  Labs:  Recent Labs  03/05/14 0532 03/06/14 0610 03/07/14 0550  HGB 7.7* 7.8* 9.5*  HCT 26.6* 27.0* 32.5*  PLT 149* 141* 117*  LABPROT  --  19.9* 18.5*  INR  --  1.75* 1.59*  TROPONINI <0.30  --   --     Estimated Creatinine Clearance: 113.6 ml/min (by C-G formula based on Cr of 0.58).   Medical History: Past Medical History  Diagnosis Date  . Iron deficiency anemia, unspecified     ice pica  . Other and unspecified hyperlipidemia   . Unspecified essential hypertension   . Retinal detachment   . Migraine, unspecified, without mention of intractable migraine without mention of status migrainosus   . Type II or unspecified type diabetes mellitus without mention of complication, not stated as uncontrolled   . Osteoarthrosis, unspecified whether generalized or localized, lower leg   . Morbid obesity   . Pulmonary embolus 2012  . GERD (gastroesophageal reflux disease)   . Headache, migraine   . Chronic pain   . OSA (obstructive sleep apnea)   . Hyperlipidemia   . Arthritis     DDD and HNP on MRI in 2008  . Endometrial polyp 08/2010    biopsied.   . Hiatal hernia 03/05/2014  . 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 F on warfarin PTA for hx PE -- however held on admit due to anemia and r/o GIB. The patient is now s/p EGD/colonoscopy that revealed a large hiatal hernia and moderate diverticulitis. Per GI, the patient will need continued iron supplementation and monitoring of CBC to keep up with chronic blood loss -- however the patient is safe to continue warfarin.   The INR today remains  SUBtherapeutic (INR 1.59 << 1.75, goal of 2-3) PTA the patient was known to be taking 3.75 mg daily EXCEPT for 7.5 mg on MWF. Hgb up to 9.5 << 7.7 after receiving PRBC on 3/28, iron replaced on 3/26. Noted plans for d/c home today with follow-up at the coumadin clinic on 3/30.  Goal of Therapy:  INR 2-3   Plan:  1. Warfarin 10 mg x 1 dose at 1800 today (if still here) 2. Daily PT/INR 3. Will continue to monitor for any signs/symptoms of bleeding and will follow up with PT/INR in the a.m.   Brenda Clark, PharmD, BCPS Clinical Pharmacist Pager: [phone] 03/07/2014 1:12 PM

## 2014-03-07 NOTE — Discharge Summary (Addendum)
PATIENT DETAILS Name: Brenda Clark Age: 63 y.o. Sex: female Date of Birth: 1951-09-05 MRN: [id number]. Admit Date: 03/02/2014 Admitting Physician: Toy Baker, MD XOV:ANVBTYO,MAYOK A, MD  Recommendations for Outpatient Follow-up:  1. Check CBC monthly-on coumadin-with Cameron Lesions with low grade bleeding 2. Continue with Fe supplementation-will need repeat Fe Panel in 6-8 weeks. 3. If develops brisk GI bleed, or if has recurrent worsening anemia inspite of close monitoring then will need to stop Coumadin permanently  PRIMARY DISCHARGE DIAGNOSIS:  Principal Problem:   Anemia Active Problems:   DIABETES MELLITUS, TYPE II   HYPERTENSION   Encounter for long-term (current) use of anticoagulants   Embolism, pulmonary with infarction   Sleep apnea with use of continuous positive airway pressure (CPAP)   Symptomatic anemia   Melena   Hiatal hernia   Cameron ulcers   Diverticulosis of colon without hemorrhage      PAST MEDICAL HISTORY: Past Medical History  Diagnosis Date  . Iron deficiency anemia, unspecified     ice pica  . Other and unspecified hyperlipidemia   . Unspecified essential hypertension   . Retinal detachment   . Migraine, unspecified, without mention of intractable migraine without mention of status migrainosus   . Type II or unspecified type diabetes mellitus without mention of complication, not stated as uncontrolled   . Osteoarthrosis, unspecified whether generalized or localized, lower leg   . Morbid obesity   . Pulmonary embolus 2012  . GERD (gastroesophageal reflux disease)   . Headache, migraine   . Chronic pain   . OSA (obstructive sleep apnea)   . Hyperlipidemia   . Arthritis     DDD and HNP on MRI in 2008  . Endometrial polyp 08/2010    biopsied.   . Hiatal hernia 03/05/2014  . Cameron ulcers 03/05/2014    DISCHARGE MEDICATIONS:   Medication List         ACCU-CHEK AVIVA PLUS test strip  Generic drug:  glucose blood  1 each by  Other route as needed.     ACCU-CHEK SOFTCLIX LANCETS lancets  1 each as needed.     acetaminophen 650 MG CR tablet  Commonly known as:  TYLENOL  Take 1,300 mg by mouth every 8 (eight) hours as needed for pain.     amLODipine 10 MG tablet  Commonly known as:  NORVASC  Take 1 tablet (10 mg total) by mouth daily.     diphenhydramine-acetaminophen 25-500 MG Tabs  Commonly known as:  TYLENOL PM  Take 1 tablet by mouth at bedtime as needed (for sleep).     ferrous sulfate 325 (65 FE) MG tablet  Take 1 tablet (325 mg total) by mouth 3 (three) times daily with meals.     glucose monitoring kit monitoring kit  1 each by Does not apply route as needed for other. Dispense any model that is covered- dispense testing supplies for Q AC/ HS accuchecks- 1 month supply with 6 refills.     lisinopril 10 MG tablet  Commonly known as:  PRINIVIL,ZESTRIL  Take 1 tablet (10 mg total) by mouth daily.     metFORMIN 500 MG 24 hr tablet  Commonly known as:  GLUCOPHAGE-XR  Take 1 tablet (500 mg total) by mouth daily with breakfast.     nitroGLYCERIN 0.4 MG SL tablet  Commonly known as:  NITROSTAT  Place 0.4 mg under the tongue every 5 (five) minutes as needed.     omeprazole 20 MG capsule  Commonly known as:  PRILOSEC  Take 2 capsules (40 mg total) by mouth daily.     warfarin 7.5 MG tablet  Commonly known as:  COUMADIN  Take 1 tablet (7.5 mg total) by mouth See admin instructions. Take 7.mg daily and go to the coumadin clinic on 3/30 for further dose adjustment.        ALLERGIES:  No Known Allergies  BRIEF HPI:  See H&P, Labs, Consult and Test reports for all details in brief, a 63 y.o. female has a past medical history of Encounter for long-term (current) use of other medications; Pica; Iron deficiency anemia, unspecified; Other and unspecified hyperlipidemia; Unspecified essential hypertension; Edema; Retinal detachment; Migraine, unspecified, without mention of intractable migraine without  mention of status migrainosus; Diverticulosis of colon (without mention of hemorrhage); Type II or unspecified type diabetes mellitus without mention of complication, not stated as uncontrolled; Osteoarthrosis, unspecified whether generalized or localized, lower leg; Morbid obesity; Unspecified sleep apnea; Esophageal reflux; Pulmonary embolus; GERD (gastroesophageal reflux disease); Headache, migraine; Chronic pain; OSA (obstructive sleep apnea); Hyperlipidemia; and Arthritis-presented with black stools and shortness of breath.   CONSULTATIONS:   GI  PERTINENT RADIOLOGIC STUDIES: No results found.   PERTINENT LAB RESULTS: CBC:  Recent Labs  03/06/14 0610 03/07/14 0550  WBC 4.9 5.4  HGB 7.8* 9.5*  HCT 27.0* 32.5*  PLT 141* 117*   CMET CMP     Component Value Date/Time   NA 139 03/02/2014 1220   K 4.1 03/02/2014 1220   CL 103 03/02/2014 1220   CO2 25 03/02/2014 1220   GLUCOSE 102* 03/02/2014 1220   BUN 10 03/02/2014 1220   CREATININE 0.58 03/02/2014 1220   CREATININE 0.61 03/01/2014 0908   CALCIUM 8.7 03/02/2014 1220   PROT 6.8 03/02/2014 1220   ALBUMIN 3.1* 03/02/2014 1220   AST 15 03/02/2014 1220   ALT 10 03/02/2014 1220   ALKPHOS 79 03/02/2014 1220   BILITOT 0.4 03/02/2014 1220   GFRNONAA >90 03/02/2014 1220   GFRNONAA >89 03/01/2014 0908   GFRAA >90 03/02/2014 1220   GFRAA >89 03/01/2014 0908    GFR Estimated Creatinine Clearance: 113.6 ml/min (by C-G formula based on Cr of 0.58). No results found for this basename: LIPASE, AMYLASE,  in the last 72 hours  Recent Labs  03/05/14 0532  TROPONINI <0.30   No components found with this basename: POCBNP,  No results found for this basename: DDIMER,  in the last 72 hours No results found for this basename: HGBA1C,  in the last 72 hours No results found for this basename: CHOL, HDL, LDLCALC, TRIG, CHOLHDL, LDLDIRECT,  in the last 72 hours No results found for this basename: TSH, T4TOTAL, FREET3, T3FREE, THYROIDAB,  in the last 72  hours No results found for this basename: VITAMINB12, FOLATE, FERRITIN, TIBC, IRON, RETICCTPCT,  in the last 72 hours Coags:  Recent Labs  03/06/14 0610 03/07/14 0550  INR 1.75* 1.59*   Microbiology: No results found for this or any previous visit (from the past 240 hour(s)).   BRIEF HOSPITAL COURSE:  Anemia  -secondary to chronic blood loss from [facility] lesions  -s/p 4unit PRBCs, Hb on discharge is 9.5 -Spoke with Dr Carlean Purl, ok to restart anticoagulation-as bleeding is chronic and low grade blood loss-as long as patient cant follow up with PCP and do regular CBC every 1-2 month while as outpatient. -c/w PPI, will need long term Fe supplementations  -d/w at length at bedside-explained above plan-she is agreeable   Chronic GI Bleed  -Colonoscopy on  3/27 showed diverticulosis, EGD on 3/27 showed cameron lesions.  -c/w PPI, will need to be on chronic Fe supplementation   Hx of DVT/PE  -INR subtherapeutic-Pharmacy managing coumadin while inpatient, INR still subtherapeutic on discharge, have asked patient to make an appointment at the coumadin clinic in the next 1-2 days.She will continue with coumadin 7.5 mg on discharge. -no s/s of DVT, or PE; [place] legs negative for DVT; echo (2014): normal LV, RV function   Diabetes mellitus type 2  -Controlled diabetes mellitus with hemoglobin A1c of 6.0.  -c/w SSI for now, resume Metformin on discharge   HTN  -controlled  -c/w Amlodipine and Lisinopril  Morbid Obesity  -counseled regarding weight loss  TODAY-DAY OF DISCHARGE:  Subjective:   Rondel Oh today has no headache,no chest abdominal pain,no new weakness tingling or numbness, feels much better wants to go home today.   Objective:   Blood pressure 113/57, pulse 88, temperature 99.1 F (37.3 C), temperature source Oral, resp. rate 18, height 5' 4" (1.626 m), weight 174.4 kg (384 lb 7.7 oz), SpO2 96.00%.  Intake/Output Summary (Last 24 hours) at 03/07/14 1002 Last data filed  at 03/07/14 0911  Gross per 24 hour  Intake   15.5 ml  Output      0 ml  Net   15.5 ml   Filed Weights   03/02/14 0419  Weight: 174.4 kg (384 lb 7.7 oz)    Exam Awake Alert, Oriented *3, No new F.N deficits, Normal affect [place].AT,PERRAL Supple Neck,No JVD, No cervical lymphadenopathy appriciated.  Symmetrical Chest wall movement, Good air movement bilaterally, CTAB RRR,No Gallops,Rubs or new Murmurs, No Parasternal Heave +ve B.Sounds, Abd Soft, Non tender, No organomegaly appriciated, No rebound -guarding or rigidity. No Cyanosis, Clubbing or edema, No new Rash or bruise  DISCHARGE CONDITION: Stable  DISPOSITION: Home  DISCHARGE INSTRUCTIONS:    Activity:  As tolerated with Full fall precautions use walker/cane & assistance as needed  Diet recommendation: Diabetic Diet Heart Healthy diet       Discharge Orders   Future Appointments Provider Department Dept Phone   03/09/2014 1:00 PM [facility] [place] [phone]   04/12/2014 7:30 AM [facility] [place] Office [phone]   04/29/2014 12:15 PM Lorayne Marek, MD [place] [phone]   Future Orders Complete By Expires   Call MD for:  difficulty breathing, headache or visual disturbances  As directed    Call MD for:  extreme fatigue  As directed    Diet - low sodium heart healthy  As directed    Diet general  As directed    Increase activity slowly  As directed       Follow-up Information   Follow up with [facility]    . Schedule an appointment as soon as possible for a visit in 1 week. (you need CBC checked at your next visit.)    Contact information:   [place] [id number] [phone]      Follow up with [facility]. Schedule an appointment as soon as possible for a visit in 2 days.   Contact information:   [address] [place] [address]         Total Time spent on discharge equals 45 minutes.  SignedOren Binet 03/07/2014 10:02 AM

## 2014-03-07 NOTE — Discharge Instructions (Signed)
Please go to the coumadin clinic in 1-2 days for INR Check Please ask your Primary MD to check a Complete Blood count every month.    CAMERON LESIONS -- Sheria LangCameron lesions are erosions or ulcers occurring in the sac of a hiatal hernia . They have been described in up to 5 percent of patients with a hiatal hernia who undergo upper endoscopy . They are usually an incidental finding, but rarely cause acute or chronic upper gastrointestinal bleeding (they are a classic cause of chronic bleeding leading to iron deficiency anemia) ]. Although their pathogenesis is incompletely understood, potential contributing factors include reflux esophagitis and mechanical trauma. Diagnosis -- The diagnosis is made by visualizing the lesion at the time of endoscopy; here, careful inspection of hiatal hernia is required as is familiarity with the appearance of the lesion (linear ulcers or erosions on the mucosal folds of a hiatal hernia at the diaphragmatic impression). Treatment -- Management depends upon the clinical setting and should thus be individualized. Acute bleeding can be treated endoscopically . Patients with iron deficiency from chronic bleeding can be treated with a proton pump inhibitor after iron repletion, which may help prevent recurrence of anemia . Surgery to repair the hiatal hernia can be considered in patients with recurrent bleeding despite the above measures.

## 2014-03-08 ENCOUNTER — Encounter (HOSPITAL_COMMUNITY): Payer: Self-pay | Admitting: Internal Medicine

## 2014-03-08 LAB — TYPE AND SCREEN
ABO/RH(D): AB POS
Antibody Screen: NEGATIVE
Unit division: 0
Unit division: 0

## 2014-03-09 ENCOUNTER — Ambulatory Visit (INDEPENDENT_AMBULATORY_CARE_PROVIDER_SITE_OTHER): Payer: PRIVATE HEALTH INSURANCE | Admitting: Pharmacist

## 2014-03-09 ENCOUNTER — Ambulatory Visit (HOSPITAL_COMMUNITY)
Admission: RE | Admit: 2014-03-09 | Discharge: 2014-03-09 | Disposition: A | Discharge status: Home / Self Care | Payer: PRIVATE HEALTH INSURANCE | Source: Ambulatory Visit | Attending: Internal Medicine | Admitting: Internal Medicine

## 2014-03-09 DIAGNOSIS — R0989 Other specified symptoms and signs involving the circulatory and respiratory systems: Secondary | ICD-10-CM

## 2014-03-09 DIAGNOSIS — Z7901 Long term (current) use of anticoagulants: Secondary | ICD-10-CM

## 2014-03-09 DIAGNOSIS — I2699 Other pulmonary embolism without acute cor pulmonale: Secondary | ICD-10-CM

## 2014-03-09 DIAGNOSIS — E785 Hyperlipidemia, unspecified: Secondary | ICD-10-CM

## 2014-03-09 DIAGNOSIS — I10 Essential (primary) hypertension: Secondary | ICD-10-CM

## 2014-03-09 LAB — POCT INR: INR: 1.5

## 2014-03-09 NOTE — Progress Notes (Signed)
VASCULAR LAB PRELIMINARY  PRELIMINARY  PRELIMINARY  PRELIMINARY  Carotid duplex completed.    Preliminary report:  Carotid duplex  Amber Williard, RVS 03/09/2014, 1:57 PM

## 2014-03-15 ENCOUNTER — Encounter: Payer: Self-pay | Admitting: Internal Medicine

## 2014-03-15 ENCOUNTER — Ambulatory Visit: Payer: PRIVATE HEALTH INSURANCE | Attending: Internal Medicine | Admitting: Internal Medicine

## 2014-03-15 VITALS — BP 148/80 | HR 80 | Temp 98.8°F | Resp 15

## 2014-03-15 DIAGNOSIS — Z7901 Long term (current) use of anticoagulants: Secondary | ICD-10-CM | POA: Insufficient documentation

## 2014-03-15 DIAGNOSIS — I2782 Chronic pulmonary embolism: Secondary | ICD-10-CM | POA: Insufficient documentation

## 2014-03-15 DIAGNOSIS — E785 Hyperlipidemia, unspecified: Secondary | ICD-10-CM | POA: Insufficient documentation

## 2014-03-15 DIAGNOSIS — D509 Iron deficiency anemia, unspecified: Secondary | ICD-10-CM | POA: Insufficient documentation

## 2014-03-15 DIAGNOSIS — D649 Anemia, unspecified: Secondary | ICD-10-CM

## 2014-03-15 DIAGNOSIS — G8929 Other chronic pain: Secondary | ICD-10-CM | POA: Insufficient documentation

## 2014-03-15 DIAGNOSIS — I10 Essential (primary) hypertension: Secondary | ICD-10-CM | POA: Insufficient documentation

## 2014-03-15 DIAGNOSIS — K219 Gastro-esophageal reflux disease without esophagitis: Secondary | ICD-10-CM | POA: Insufficient documentation

## 2014-03-15 DIAGNOSIS — E119 Type 2 diabetes mellitus without complications: Secondary | ICD-10-CM | POA: Insufficient documentation

## 2014-03-15 DIAGNOSIS — Z09 Encounter for follow-up examination after completed treatment for conditions other than malignant neoplasm: Secondary | ICD-10-CM | POA: Insufficient documentation

## 2014-03-15 NOTE — Progress Notes (Signed)
Patient here for hospital follow up and blood work Was admitted for anemia

## 2014-03-15 NOTE — Progress Notes (Signed)
MRN: [id number] Name: Brenda Clark  Sex: female Age: 63 y.o. DOB: [birth year]  Allergies: Review of patient's allergies indicates no known allergies.  Chief Complaint  Patient presents with  . Hospitalization Follow-up    HPI: Patient is 63 y.o. female who has  history of diabetes hypertension PE on Coumadin, comes today for followup she was recently hospitalized  Found to  have severe anemia, has chronic iron deficiency anemia likely secondary to GI bleed her hemoglobin was 5.2, she is status post 4 units of RBCs, initially her Coumadin was held, GI was on board had colonoscopy/endoscopy done showed diverticulosis, patient was recommended to continue with PPI and iron supplementation, she was resumed back on Coumadin history of DVT PE, which is being followed up by her cardiologist, her diabetes is well controlled.  Past Medical History  Diagnosis Date  . Iron deficiency anemia, unspecified     ice pica  . Other and unspecified hyperlipidemia   . Unspecified essential hypertension   . Retinal detachment   . Migraine, unspecified, without mention of intractable migraine without mention of status migrainosus   . Type II or unspecified type diabetes mellitus without mention of complication, not stated as uncontrolled   . Osteoarthrosis, unspecified whether generalized or localized, lower leg   . Morbid obesity   . Pulmonary embolus 2012  . GERD (gastroesophageal reflux disease)   . Headache, migraine   . Chronic pain   . OSA (obstructive sleep apnea)   . Hyperlipidemia   . Arthritis     DDD and HNP on MRI in 2008  . Endometrial polyp 08/2010    biopsied.   . Hiatal hernia 03/05/2014  . 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 Cataract extraction    . Lumbar disc surgery    . Tubal ligation    . Colonoscopy with propofol N/A 03/05/2014    Procedure: COLONOSCOPY WITH PROPOFOL;  Surgeon: Gatha Mayer, MD;  Location: [place];   Service: Endoscopy;  Laterality: N/A;  . Esophagogastroduodenoscopy (egd) with propofol N/A 03/05/2014    Procedure: ESOPHAGOGASTRODUODENOSCOPY (EGD) WITH PROPOFOL;  Surgeon: Gatha Mayer, MD;  Location: [place];  Service: Endoscopy;  Laterality: N/A;      Medication List       This list is accurate as of: 03/15/14  4:13 PM.  Always use your most recent med list.               ACCU-CHEK AVIVA PLUS test strip  Generic drug:  glucose blood  1 each by Other route as needed.     ACCU-CHEK SOFTCLIX LANCETS lancets  1 each as needed.     acetaminophen 650 MG CR tablet  Commonly known as:  TYLENOL  Take 1,300 mg by mouth every 8 (eight) hours as needed for pain.     amLODipine 10 MG tablet  Commonly known as:  NORVASC  Take 1 tablet (10 mg total) by mouth daily.     diphenhydramine-acetaminophen 25-500 MG Tabs  Commonly known as:  TYLENOL PM  Take 1 tablet by mouth at bedtime as needed (for sleep).     ferrous sulfate 325 (65 FE) MG tablet  Take 1 tablet (325 mg total) by mouth 3 (three) times daily with meals.     glucose monitoring kit monitoring kit  1 each by Does not apply route as needed for other. Dispense any model that is covered- dispense testing supplies for Q  AC/ HS accuchecks- 1 month supply with 6 refills.     lisinopril 10 MG tablet  Commonly known as:  PRINIVIL,ZESTRIL  Take 1 tablet (10 mg total) by mouth daily.     metFORMIN 500 MG 24 hr tablet  Commonly known as:  GLUCOPHAGE-XR  Take 1 tablet (500 mg total) by mouth daily with breakfast.     nitroGLYCERIN 0.4 MG SL tablet  Commonly known as:  NITROSTAT  Place 0.4 mg under the tongue every 5 (five) minutes as needed.     omeprazole 20 MG capsule  Commonly known as:  PRILOSEC  Take 2 capsules (40 mg total) by mouth daily.     warfarin 7.5 MG tablet  Commonly known as:  COUMADIN  Take 1 tablet (7.5 mg total) by mouth See admin instructions. Take 7.5 mg daily and go to the coumadin clinic on 3/30  for further dose adjustment.        No orders of the defined types were placed in this encounter.    Immunization History  Administered Date(s) Administered  . Influenza Whole 09/09/2006  . Pneumococcal Polysaccharide-23 09/09/2005  . Td 02/07/2006    Family History  Problem Relation Age of Onset  . Colon cancer Father   . Breast cancer Sister   . Diabetes Sister     History  Substance Use Topics  . Smoking status: Never Smoker   . Smokeless tobacco: Never Used  . Alcohol Use: No    Review of Systems   As noted in HPI  Filed Vitals:   03/15/14 1541  BP: 148/80  Pulse: 80  Temp: 98.8 F (37.1 C)  Resp: 15    Physical Exam  Physical Exam  Constitutional: No distress.  Eyes: EOM are normal. Pupils are equal, round, and reactive to light.  Cardiovascular: Normal rate and regular rhythm.   Pulmonary/Chest: Breath sounds normal. No respiratory distress. She has no wheezes. She has no rales.  Musculoskeletal: She exhibits edema.    CBC    Component Value Date/Time   WBC 5.4 03/07/2014 0550   RBC 4.31 03/07/2014 0550   RBC 3.26* 03/02/2014 1220   HGB 9.5* 03/07/2014 0550   HCT 32.5* 03/07/2014 0550   PLT 117* 03/07/2014 0550   MCV 75.4* 03/07/2014 0550   LYMPHSABS 1.5 03/02/2014 0004   MONOABS 0.4 03/02/2014 0004   EOSABS 0.1 03/02/2014 0004   BASOSABS 0.1 03/02/2014 0004    CMP     Component Value Date/Time   NA 139 03/02/2014 1220   K 4.1 03/02/2014 1220   CL 103 03/02/2014 1220   CO2 25 03/02/2014 1220   GLUCOSE 102* 03/02/2014 1220   BUN 10 03/02/2014 1220   CREATININE 0.58 03/02/2014 1220   CREATININE 0.61 03/01/2014 0908   CALCIUM 8.7 03/02/2014 1220   PROT 6.8 03/02/2014 1220   ALBUMIN 3.1* 03/02/2014 1220   AST 15 03/02/2014 1220   ALT 10 03/02/2014 1220   ALKPHOS 79 03/02/2014 1220   BILITOT 0.4 03/02/2014 1220   GFRNONAA >90 03/02/2014 1220   GFRNONAA >89 03/01/2014 0908   GFRAA >90 03/02/2014 1220   GFRAA >89 03/01/2014 0908    Lab Results  Component  Value Date/Time   CHOL 179 03/01/2014  9:08 AM    No components found with this basename: hga1c    Lab Results  Component Value Date/Time   AST 15 03/02/2014 12:20 PM    Assessment and Plan  Follow up  Anemia - Plan: CBC with Differential,  will repeat her CBC today and in one month  Symptomatic anemia Improved  continue with iron supplementation  HTN (hypertension) Advised for DASH diet continue with Norvasc and her lisinopril  DM type 2 (diabetes mellitus, type 2)   well controlled continue with current regimen  Patient also following up with her cardiologist and is on Coumadin.  Return in about 3 months (around 06/14/2014).  Lorayne Marek, MD

## 2014-03-16 ENCOUNTER — Ambulatory Visit (INDEPENDENT_AMBULATORY_CARE_PROVIDER_SITE_OTHER): Payer: PRIVATE HEALTH INSURANCE | Admitting: Pharmacist Clinician (PhC)/ Clinical Pharmacy Specialist

## 2014-03-16 ENCOUNTER — Telehealth: Payer: Self-pay

## 2014-03-16 DIAGNOSIS — Z7901 Long term (current) use of anticoagulants: Secondary | ICD-10-CM

## 2014-03-16 DIAGNOSIS — I10 Essential (primary) hypertension: Secondary | ICD-10-CM | POA: Insufficient documentation

## 2014-03-16 DIAGNOSIS — I2699 Other pulmonary embolism without acute cor pulmonale: Secondary | ICD-10-CM

## 2014-03-16 LAB — CBC WITH DIFFERENTIAL/PLATELET
Basophils Absolute: 0 10*3/uL (ref 0.0–0.1)
Basophils Relative: 0 % (ref 0–1)
EOS PCT: 2 % (ref 0–5)
Eosinophils Absolute: 0.1 10*3/uL (ref 0.0–0.7)
HEMATOCRIT: 36.1 % (ref 36.0–46.0)
Hemoglobin: 10.7 g/dL — ABNORMAL LOW (ref 12.0–15.0)
LYMPHS ABS: 1.6 10*3/uL (ref 0.7–4.0)
Lymphocytes Relative: 25 % (ref 12–46)
MCH: 22.8 pg — ABNORMAL LOW (ref 26.0–34.0)
MCHC: 29.6 g/dL — ABNORMAL LOW (ref 30.0–36.0)
MCV: 76.8 fL — AB (ref 78.0–100.0)
MONO ABS: 0.3 10*3/uL (ref 0.1–1.0)
Monocytes Relative: 4 % (ref 3–12)
Neutro Abs: 4.3 10*3/uL (ref 1.7–7.7)
Neutrophils Relative %: 69 % (ref 43–77)
Platelets: 487 10*3/uL — ABNORMAL HIGH (ref 150–400)
RBC: 4.7 MIL/uL (ref 3.87–5.11)
RDW: 26.4 % — ABNORMAL HIGH (ref 11.5–15.5)
WBC: 6.3 10*3/uL (ref 4.0–10.5)

## 2014-03-16 LAB — POCT INR: INR: 2.8

## 2014-03-16 NOTE — Telephone Encounter (Signed)
Patient is aware of her lab results and to continue with her iron supplement

## 2014-03-16 NOTE — Telephone Encounter (Signed)
Message copied by Lestine MountJUAREZ, Athalie Newhard L on Tue Mar 16, 2014 12:24 PM ------      Message from: Doris CheadleADVANI, DEEPAK      Created: Tue Mar 16, 2014  9:24 AM       blood work reviewed with the patient know that her hemoglobin is improving continue with iron supplement. ------

## 2014-04-15 ENCOUNTER — Encounter: Payer: PRIVATE HEALTH INSURANCE | Admitting: Obstetrics & Gynecology

## 2014-04-29 ENCOUNTER — Ambulatory Visit: Payer: PRIVATE HEALTH INSURANCE | Admitting: Internal Medicine

## 2014-05-07 ENCOUNTER — Ambulatory Visit (INDEPENDENT_AMBULATORY_CARE_PROVIDER_SITE_OTHER): Payer: PRIVATE HEALTH INSURANCE | Admitting: Surgery

## 2014-05-07 DIAGNOSIS — I2699 Other pulmonary embolism without acute cor pulmonale: Secondary | ICD-10-CM

## 2014-05-07 DIAGNOSIS — Z7901 Long term (current) use of anticoagulants: Secondary | ICD-10-CM

## 2014-05-07 LAB — POCT INR: INR: 2

## 2014-05-14 ENCOUNTER — Encounter: Payer: PRIVATE HEALTH INSURANCE | Admitting: Medical

## 2014-05-21 ENCOUNTER — Ambulatory Visit: Payer: PRIVATE HEALTH INSURANCE | Admitting: Internal Medicine

## 2014-05-28 ENCOUNTER — Ambulatory Visit (INDEPENDENT_AMBULATORY_CARE_PROVIDER_SITE_OTHER): Payer: PRIVATE HEALTH INSURANCE | Admitting: *Deleted

## 2014-05-28 DIAGNOSIS — I2699 Other pulmonary embolism without acute cor pulmonale: Secondary | ICD-10-CM

## 2014-05-28 DIAGNOSIS — Z7901 Long term (current) use of anticoagulants: Secondary | ICD-10-CM

## 2014-05-28 LAB — POCT INR: INR: 2.3

## 2014-05-31 ENCOUNTER — Encounter: Payer: PRIVATE HEALTH INSURANCE | Admitting: Family Medicine

## 2014-06-02 ENCOUNTER — Ambulatory Visit: Payer: PRIVATE HEALTH INSURANCE | Attending: Internal Medicine

## 2014-06-02 DIAGNOSIS — D649 Anemia, unspecified: Secondary | ICD-10-CM

## 2014-06-02 DIAGNOSIS — I2699 Other pulmonary embolism without acute cor pulmonale: Secondary | ICD-10-CM

## 2014-06-02 DIAGNOSIS — E119 Type 2 diabetes mellitus without complications: Secondary | ICD-10-CM

## 2014-06-02 DIAGNOSIS — Z7901 Long term (current) use of anticoagulants: Secondary | ICD-10-CM

## 2014-06-02 LAB — CBC WITH DIFFERENTIAL/PLATELET
BASOS ABS: 0 10*3/uL (ref 0.0–0.1)
Basophils Relative: 0 % (ref 0–1)
Eosinophils Absolute: 0 10*3/uL (ref 0.0–0.7)
Eosinophils Relative: 1 % (ref 0–5)
HCT: 37.3 % (ref 36.0–46.0)
Hemoglobin: 12.3 g/dL (ref 12.0–15.0)
Lymphocytes Relative: 24 % (ref 12–46)
Lymphs Abs: 1.1 10*3/uL (ref 0.7–4.0)
MCH: 27.1 pg (ref 26.0–34.0)
MCHC: 33 g/dL (ref 30.0–36.0)
MCV: 82.2 fL (ref 78.0–100.0)
MONOS PCT: 5 % (ref 3–12)
Monocytes Absolute: 0.2 10*3/uL (ref 0.1–1.0)
NEUTROS ABS: 3.3 10*3/uL (ref 1.7–7.7)
NEUTROS PCT: 70 % (ref 43–77)
Platelets: 269 10*3/uL (ref 150–400)
RBC: 4.54 MIL/uL (ref 3.87–5.11)
RDW: 16.2 % — AB (ref 11.5–15.5)
WBC: 4.7 10*3/uL (ref 4.0–10.5)

## 2014-06-02 LAB — COMPLETE METABOLIC PANEL WITH GFR
ALBUMIN: 3.7 g/dL (ref 3.5–5.2)
ALT: 8 U/L (ref 0–35)
AST: 15 U/L (ref 0–37)
Alkaline Phosphatase: 84 U/L (ref 39–117)
BUN: 11 mg/dL (ref 6–23)
CALCIUM: 9 mg/dL (ref 8.4–10.5)
CHLORIDE: 104 meq/L (ref 96–112)
CO2: 24 meq/L (ref 19–32)
Creat: 0.59 mg/dL (ref 0.50–1.10)
GLUCOSE: 103 mg/dL — AB (ref 70–99)
Potassium: 4.4 mEq/L (ref 3.5–5.3)
Sodium: 137 mEq/L (ref 135–145)
TOTAL PROTEIN: 6.9 g/dL (ref 6.0–8.3)
Total Bilirubin: 0.2 mg/dL (ref 0.2–1.2)

## 2014-06-02 LAB — POCT INR: INR: 2.7

## 2014-06-02 LAB — POCT GLYCOSYLATED HEMOGLOBIN (HGB A1C): HEMOGLOBIN A1C: 5.7

## 2014-06-03 ENCOUNTER — Telehealth: Payer: Self-pay

## 2014-06-03 NOTE — Telephone Encounter (Signed)
Message copied by Lestine MountJUAREZ, DENISE L on Thu Jun 03, 2014 12:01 PM ------      Message from: Doris CheadleADVANI, DEEPAK      Created: Thu Jun 03, 2014 11:42 AM       Call and let the patient know that her anemia is improved , continue with iron supplement. ------

## 2014-06-03 NOTE — Telephone Encounter (Signed)
Patient not available Left message on voice mail that Her anemia has improved

## 2014-06-17 ENCOUNTER — Other Ambulatory Visit (HOSPITAL_COMMUNITY)
Admission: RE | Admit: 2014-06-17 | Discharge: 2014-06-17 | Disposition: A | Discharge status: Home / Self Care | Payer: PRIVATE HEALTH INSURANCE | Source: Ambulatory Visit | Attending: Obstetrics and Gynecology | Admitting: Obstetrics and Gynecology

## 2014-06-17 ENCOUNTER — Ambulatory Visit (INDEPENDENT_AMBULATORY_CARE_PROVIDER_SITE_OTHER): Payer: PRIVATE HEALTH INSURANCE | Admitting: Obstetrics and Gynecology

## 2014-06-17 ENCOUNTER — Other Ambulatory Visit: Payer: Self-pay | Admitting: Obstetrics and Gynecology

## 2014-06-17 ENCOUNTER — Encounter: Payer: Self-pay | Admitting: Obstetrics and Gynecology

## 2014-06-17 VITALS — BP 164/95 | HR 94 | Temp 97.5°F | Ht 64.0 in | Wt 380.3 lb

## 2014-06-17 DIAGNOSIS — Z1231 Encounter for screening mammogram for malignant neoplasm of breast: Secondary | ICD-10-CM

## 2014-06-17 DIAGNOSIS — Z124 Encounter for screening for malignant neoplasm of cervix: Secondary | ICD-10-CM | POA: Diagnosis not present

## 2014-06-17 DIAGNOSIS — N841 Polyp of cervix uteri: Secondary | ICD-10-CM | POA: Insufficient documentation

## 2014-06-17 DIAGNOSIS — Z01419 Encounter for gynecological examination (general) (routine) without abnormal findings: Secondary | ICD-10-CM

## 2014-06-17 NOTE — Patient Instructions (Signed)
Preventive Care for Adults A healthy lifestyle and preventive care can promote health and wellness. Preventive health guidelines for women include the following key practices.  A routine yearly physical is a good way to check with your health care provider about your health and preventive screening. It is a chance to share any concerns and updates on your health and to receive a thorough exam.  Visit your dentist for a routine exam and preventive care every 6 months. Brush your teeth twice a day and floss once a day. Good oral hygiene prevents tooth decay and gum disease.  The frequency of eye exams is based on your age, health, family medical history, use of contact lenses, and other factors. Follow your health care provider's recommendations for frequency of eye exams.  Eat a healthy diet. Foods like vegetables, fruits, whole grains, low-fat dairy products, and lean protein foods contain the nutrients you need without too many calories. Decrease your intake of foods high in solid fats, added sugars, and salt. Eat the right amount of calories for you.Get information about a proper diet from your health care provider, if necessary.  Regular physical exercise is one of the most important things you can do for your health. Most adults should get at least 150 minutes of moderate-intensity exercise (any activity that increases your heart rate and causes you to sweat) each week. In addition, most adults need muscle-strengthening exercises on 2 or more days a week.  Maintain a healthy weight. The body mass index (BMI) is a screening tool to identify possible weight problems. It provides an estimate of body fat based on height and weight. Your health care provider can find your BMI, and can help you achieve or maintain a healthy weight.For adults 20 years and older:  A BMI below 18.5 is considered underweight.  A BMI of 18.5 to 24.9 is normal.  A BMI of 25 to 29.9 is considered overweight.  A BMI  of 30 and above is considered obese.  Maintain normal blood lipids and cholesterol levels by exercising and minimizing your intake of saturated fat. Eat a balanced diet with plenty of fruit and vegetables. Blood tests for lipids and cholesterol should begin at age 47 and be repeated every 5 years. If your lipid or cholesterol levels are high, you are over 50, or you are at high risk for heart disease, you may need your cholesterol levels checked more frequently.Ongoing high lipid and cholesterol levels should be treated with medicines if diet and exercise are not working.  If you smoke, find out from your health care provider how to quit. If you do not use tobacco, do not start.  Lung cancer screening is recommended for adults aged 40-80 years who are at high risk for developing lung cancer because of a history of smoking. A yearly low-dose CT scan of the lungs is recommended for people who have at least a 30-pack-year history of smoking and are a current smoker or have quit within the past 15 years. A pack year of smoking is smoking an average of 1 pack of cigarettes a day for 1 year (for example: 1 pack a day for 30 years or 2 packs a day for 15 years). Yearly screening should continue until the smoker has stopped smoking for at least 15 years. Yearly screening should be stopped for people who develop a health problem that would prevent them from having lung cancer treatment.  If you are pregnant, do not drink alcohol. If you are breastfeeding,  be very cautious about drinking alcohol. If you are not pregnant and choose to drink alcohol, do not have more than 1 drink per day. One drink is considered to be 12 ounces (355 mL) of beer, 5 ounces (148 mL) of wine, or 1.5 ounces (44 mL) of liquor.  Avoid use of street drugs. Do not share needles with anyone. Ask for help if you need support or instructions about stopping the use of drugs.  High blood pressure causes heart disease and increases the risk of  stroke. Your blood pressure should be checked at least every 1 to 2 years. Ongoing high blood pressure should be treated with medicines if weight loss and exercise do not work.  If you are 75-52 years old, ask your health care provider if you should take aspirin to prevent strokes.  Diabetes screening involves taking a blood sample to check your fasting blood sugar level. This should be done once every 3 years, after age 15, if you are within normal weight and without risk factors for diabetes. Testing should be considered at a younger age or be carried out more frequently if you are overweight and have at least 1 risk factor for diabetes.  Breast cancer screening is essential preventive care for women. You should practice "breast self-awareness." This means understanding the normal appearance and feel of your breasts and may include breast self-examination. Any changes detected, no matter how small, should be reported to a health care provider. Women in their 58s and 30s should have a clinical breast exam (CBE) by a health care provider as part of a regular health exam every 1 to 3 years. After age 16, women should have a CBE every year. Starting at age 53, women should consider having a mammogram (breast X-ray test) every year. Women who have a family history of breast cancer should talk to their health care provider about genetic screening. Women at a high risk of breast cancer should talk to their health care providers about having an MRI and a mammogram every year.  Breast cancer gene (BRCA)-related cancer risk assessment is recommended for women who have family members with BRCA-related cancers. BRCA-related cancers include breast, ovarian, tubal, and peritoneal cancers. Having family members with these cancers may be associated with an increased risk for harmful changes (mutations) in the breast cancer genes BRCA1 and BRCA2. Results of the assessment will determine the need for genetic counseling and  BRCA1 and BRCA2 testing.  Routine pelvic exams to screen for cancer are no longer recommended for nonpregnant women who are considered low risk for cancer of the pelvic organs (ovaries, uterus, and vagina) and who do not have symptoms. Ask your health care provider if a screening pelvic exam is right for you.  If you have had past treatment for cervical cancer or a condition that could lead to cancer, you need Pap tests and screening for cancer for at least 20 years after your treatment. If Pap tests have been discontinued, your risk factors (such as having a new sexual partner) need to be reassessed to determine if screening should be resumed. Some women have medical problems that increase the chance of getting cervical cancer. In these cases, your health care provider may recommend more frequent screening and Pap tests.  The HPV test is an additional test that may be used for cervical cancer screening. The HPV test looks for the virus that can cause the cell changes on the cervix. The cells collected during the Pap test can be  tested for HPV. The HPV test could be used to screen women aged 47 years and older, and should be used in women of any age who have unclear Pap test results. After the age of 36, women should have HPV testing at the same frequency as a Pap test.  Colorectal cancer can be detected and often prevented. Most routine colorectal cancer screening begins at the age of 38 years and continues through age 58 years. However, your health care provider may recommend screening at an earlier age if you have risk factors for colon cancer. On a yearly basis, your health care provider may provide home test kits to check for hidden blood in the stool. Use of a small camera at the end of a tube, to directly examine the colon (sigmoidoscopy or colonoscopy), can detect the earliest forms of colorectal cancer. Talk to your health care provider about this at age 64, when routine screening begins. Direct  exam of the colon should be repeated every 5-10 years through age 21 years, unless early forms of pre-cancerous polyps or small growths are found.  People who are at an increased risk for hepatitis B should be screened for this virus. You are considered at high risk for hepatitis B if:  You were born in a country where hepatitis B occurs often. Talk with your health care provider about which countries are considered high risk.  Your parents were born in a high-risk country and you have not received a shot to protect against hepatitis B (hepatitis B vaccine).  You have HIV or AIDS.  You use needles to inject street drugs.  You live with, or have sex with, someone who has Hepatitis B.  You get hemodialysis treatment.  You take certain medicines for conditions like cancer, organ transplantation, and autoimmune conditions.  Hepatitis C blood testing is recommended for all people born from [birth year] through 1965 and any individual with known risks for hepatitis C.  Practice safe sex. Use condoms and avoid high-risk sexual practices to reduce the spread of sexually transmitted infections (STIs). STIs include gonorrhea, chlamydia, syphilis, trichomonas, herpes, HPV, and human immunodeficiency virus (HIV). Herpes, HIV, and HPV are viral illnesses that have no cure. They can result in disability, cancer, and death.  You should be screened for sexually transmitted illnesses (STIs) including gonorrhea and chlamydia if:  You are sexually active and are younger than 24 years.  You are older than 24 years and your health care provider tells you that you are at risk for this type of infection.  Your sexual activity has changed since you were last screened and you are at an increased risk for chlamydia or gonorrhea. Ask your health care provider if you are at risk.  If you are at risk of being infected with HIV, it is recommended that you take a prescription medicine daily to prevent HIV infection. This is  called preexposure prophylaxis (PrEP). You are considered at risk if:  You are a heterosexual woman, are sexually active, and are at increased risk for HIV infection.  You take drugs by injection.  You are sexually active with a partner who has HIV.  Talk with your health care provider about whether you are at high risk of being infected with HIV. If you choose to begin PrEP, you should first be tested for HIV. You should then be tested every 3 months for as long as you are taking PrEP.  Osteoporosis is a disease in which the bones lose minerals and strength  with aging. This can result in serious bone fractures or breaks. The risk of osteoporosis can be identified using a bone density scan. Women ages 65 years and over and women at risk for fractures or osteoporosis should discuss screening with their health care providers. Ask your health care provider whether you should take a calcium supplement or vitamin D to reduce the rate of osteoporosis.  Menopause can be associated with physical symptoms and risks. Hormone replacement therapy is available to decrease symptoms and risks. You should talk to your health care provider about whether hormone replacement therapy is right for you.  Use sunscreen. Apply sunscreen liberally and repeatedly throughout the day. You should seek shade when your shadow is shorter than you. Protect yourself by wearing long sleeves, pants, a wide-brimmed hat, and sunglasses year round, whenever you are outdoors.  Once a month, do a whole body skin exam, using a mirror to look at the skin on your back. Tell your health care provider of new moles, moles that have irregular borders, moles that are larger than a pencil eraser, or moles that have changed in shape or color.  Stay current with required vaccines (immunizations).  Influenza vaccine. All adults should be immunized every year.  Tetanus, diphtheria, and acellular pertussis (Td, Tdap) vaccine. Pregnant women should  receive 1 dose of Tdap vaccine during each pregnancy. The dose should be obtained regardless of the length of time since the last dose. Immunization is preferred during the 27th-36th week of gestation. An adult who has not previously received Tdap or who does not know her vaccine status should receive 1 dose of Tdap. This initial dose should be followed by tetanus and diphtheria toxoids (Td) booster doses every 10 years. Adults with an unknown or incomplete history of completing a 3-dose immunization series with Td-containing vaccines should begin or complete a primary immunization series including a Tdap dose. Adults should receive a Td booster every 10 years.  Varicella vaccine. An adult without evidence of immunity to varicella should receive 2 doses or a second dose if she has previously received 1 dose. Pregnant females who do not have evidence of immunity should receive the first dose after pregnancy. This first dose should be obtained before leaving the health care facility. The second dose should be obtained 4-8 weeks after the first dose.  Human papillomavirus (HPV) vaccine. Females aged 13-26 years who have not received the vaccine previously should obtain the 3-dose series. The vaccine is not recommended for use in pregnant females. However, pregnancy testing is not needed before receiving a dose. If a female is found to be pregnant after receiving a dose, no treatment is needed. In that case, the remaining doses should be delayed until after the pregnancy. Immunization is recommended for any person with an immunocompromised condition through the age of 26 years if she did not get any or all doses earlier. During the 3-dose series, the second dose should be obtained 4-8 weeks after the first dose. The third dose should be obtained 24 weeks after the first dose and 16 weeks after the second dose.  Zoster vaccine. One dose is recommended for adults aged 60 years or older unless certain conditions are  present.  Measles, mumps, and rubella (MMR) vaccine. Adults born before 1957 generally are considered immune to measles and mumps. Adults born in 1957 or later should have 1 or more doses of MMR vaccine unless there is a contraindication to the vaccine or there is laboratory evidence of immunity to   each of the three diseases. A routine second dose of MMR vaccine should be obtained at least 28 days after the first dose for students attending postsecondary schools, health care workers, or international travelers. People who received inactivated measles vaccine or an unknown type of measles vaccine during 1963-1967 should receive 2 doses of MMR vaccine. People who received inactivated mumps vaccine or an unknown type of mumps vaccine before 1979 and are at high risk for mumps infection should consider immunization with 2 doses of MMR vaccine. For females of childbearing age, rubella immunity should be determined. If there is no evidence of immunity, females who are not pregnant should be vaccinated. If there is no evidence of immunity, females who are pregnant should delay immunization until after pregnancy. Unvaccinated health care workers born before 1957 who lack laboratory evidence of measles, mumps, or rubella immunity or laboratory confirmation of disease should consider measles and mumps immunization with 2 doses of MMR vaccine or rubella immunization with 1 dose of MMR vaccine.  Pneumococcal 13-valent conjugate (PCV13) vaccine. When indicated, a person who is uncertain of her immunization history and has no record of immunization should receive the PCV13 vaccine. An adult aged 19 years or older who has certain medical conditions and has not been previously immunized should receive 1 dose of PCV13 vaccine. This PCV13 should be followed with a dose of pneumococcal polysaccharide (PPSV23) vaccine. The PPSV23 vaccine dose should be obtained at least 8 weeks after the dose of PCV13 vaccine. An adult aged 19  years or older who has certain medical conditions and previously received 1 or more doses of PPSV23 vaccine should receive 1 dose of PCV13. The PCV13 vaccine dose should be obtained 1 or more years after the last PPSV23 vaccine dose.  Pneumococcal polysaccharide (PPSV23) vaccine. When PCV13 is also indicated, PCV13 should be obtained first. All adults aged 65 years and older should be immunized. An adult younger than age 65 years who has certain medical conditions should be immunized. Any person who resides in a nursing home or long-term care facility should be immunized. An adult smoker should be immunized. People with an immunocompromised condition and certain other conditions should receive both PCV13 and PPSV23 vaccines. People with human immunodeficiency virus (HIV) infection should be immunized as soon as possible after diagnosis. Immunization during chemotherapy or radiation therapy should be avoided. Routine use of PPSV23 vaccine is not recommended for American Indians, [place] Natives, or people younger than 65 years unless there are medical conditions that require PPSV23 vaccine. When indicated, people who have unknown immunization and have no record of immunization should receive PPSV23 vaccine. One-time revaccination 5 years after the first dose of PPSV23 is recommended for people aged 19-64 years who have chronic kidney failure, nephrotic syndrome, asplenia, or immunocompromised conditions. People who received 1-2 doses of PPSV23 before age 65 years should receive another dose of PPSV23 vaccine at age 65 years or later if at least 5 years have passed since the previous dose. Doses of PPSV23 are not needed for people immunized with PPSV23 at or after age 65 years.  Meningococcal vaccine. Adults with asplenia or persistent complement component deficiencies should receive 2 doses of quadrivalent meningococcal conjugate (MenACWY-D) vaccine. The doses should be obtained at least 2 months apart.  Microbiologists working with certain meningococcal bacteria, military recruits, people at risk during an outbreak, and people who travel to or live in countries with a high rate of meningitis should be immunized. A first-year college student up through age   21 years who is living in a residence hall should receive a dose if she did not receive a dose on or after her 16th birthday. Adults who have certain high-risk conditions should receive one or more doses of vaccine.  Hepatitis A vaccine. Adults who wish to be protected from this disease, have certain high-risk conditions, work with hepatitis A-infected animals, work in hepatitis A research labs, or travel to or work in countries with a high rate of hepatitis A should be immunized. Adults who were previously unvaccinated and who anticipate close contact with an international adoptee during the first 60 days after arrival in the [place] from a country with a high rate of hepatitis A should be immunized.  Hepatitis B vaccine. Adults who wish to be protected from this disease, have certain high-risk conditions, may be exposed to blood or other infectious body fluids, are household contacts or sex partners of hepatitis B positive people, are clients or workers in certain care facilities, or travel to or work in countries with a high rate of hepatitis B should be immunized.  Haemophilus influenzae type b (Hib) vaccine. A previously unvaccinated person with asplenia or sickle cell disease or having a scheduled splenectomy should receive 1 dose of Hib vaccine. Regardless of previous immunization, a recipient of a hematopoietic stem cell transplant should receive a 3-dose series 6-12 months after her successful transplant. Hib vaccine is not recommended for adults with HIV infection. Preventive Services / Frequency Ages 43 to 39years  Blood pressure check.** / Every 1 to 2 years.  Lipid and cholesterol check.** / Every 5 years beginning at age  75.  Clinical breast exam.** / Every 3 years for women in their 32s and 74s.  BRCA-related cancer risk assessment.** / For women who have family members with a BRCA-related cancer (breast, ovarian, tubal, or peritoneal cancers).  Pap test.** / Every 2 years from ages 65 through 91. Every 3 years starting at age 34 through age 93 or 72 with a history of 3 consecutive normal Pap tests.  HPV screening.** / Every 3 years from ages 46 through ages 53 to 26 with a history of 3 consecutive normal Pap tests.  Hepatitis C blood test.** / For any individual with known risks for hepatitis C.  Skin self-exam. / Monthly.  Influenza vaccine. / Every year.  Tetanus, diphtheria, and acellular pertussis (Tdap, Td) vaccine.** / Consult your health care provider. Pregnant women should receive 1 dose of Tdap vaccine during each pregnancy. 1 dose of Td every 10 years.  Varicella vaccine.** / Consult your health care provider. Pregnant females who do not have evidence of immunity should receive the first dose after pregnancy.  HPV vaccine. / 3 doses over 6 months, if 70 and younger. The vaccine is not recommended for use in pregnant females. However, pregnancy testing is not needed before receiving a dose.  Measles, mumps, rubella (MMR) vaccine.** / You need at least 1 dose of MMR if you were born in 1957 or later. You may also need a 2nd dose. For females of childbearing age, rubella immunity should be determined. If there is no evidence of immunity, females who are not pregnant should be vaccinated. If there is no evidence of immunity, females who are pregnant should delay immunization until after pregnancy.  Pneumococcal 13-valent conjugate (PCV13) vaccine.** / Consult your health care provider.  Pneumococcal polysaccharide (PPSV23) vaccine.** / 1 to 2 doses if you smoke cigarettes or if you have certain conditions.  Meningococcal vaccine.** /  1 dose if you are age 70 to 51 years and a [place] living in a residence hall, or have one of several medical conditions, you need to get vaccinated against meningococcal disease. You may also need additional booster doses.  Hepatitis A vaccine.** / Consult your health care provider.  Hepatitis B vaccine.** / Consult your health care provider.  Haemophilus influenzae type b (Hib) vaccine.** / Consult your health care provider. Ages 40 to 64years  Blood pressure check.** / Every 1 to 2 years.  Lipid and cholesterol check.** / Every 5 years beginning at age 58 years.  Lung cancer screening. / Every year if you are aged 56-80 years and have a 30-pack-year history of smoking and currently smoke or have quit within the past 15 years. Yearly screening is stopped once you have quit smoking for at least 15 years or develop a health problem that would prevent you from having lung cancer treatment.  Clinical breast exam.** / Every year after age 35 years.  BRCA-related cancer risk assessment.** / For women who have family members with a BRCA-related cancer (breast, ovarian, tubal, or peritoneal cancers).  Mammogram.** / Every year beginning at age 109 years and continuing for as long as you are in good health. Consult with your health care provider.  Pap test.** / Every 3 years starting at age 44 years through age 94 or 70 years with a history of 3 consecutive normal Pap tests.  HPV screening.** / Every 3 years from ages 109 years through ages 50 to 30 years with a history of 3 consecutive normal Pap tests.  Fecal occult blood test (FOBT) of stool. / Every year beginning at age 73 years and continuing until age 59 years. You may not need to do this test if you get a colonoscopy every 10 years.  Flexible sigmoidoscopy or colonoscopy.** / Every 5 years for a flexible sigmoidoscopy or every 10 years for a colonoscopy beginning at age 68 years and continuing until age 12 years.  Hepatitis C blood test.** / For all people born from [birth year] through  1965 and any individual with known risks for hepatitis C.  Skin self-exam. / Monthly.  Influenza vaccine. / Every year.  Tetanus, diphtheria, and acellular pertussis (Tdap/Td) vaccine.** / Consult your health care provider. Pregnant women should receive 1 dose of Tdap vaccine during each pregnancy. 1 dose of Td every 10 years.  Varicella vaccine.** / Consult your health care provider. Pregnant females who do not have evidence of immunity should receive the first dose after pregnancy.  Zoster vaccine.** / 1 dose for adults aged 2 years or older.  Measles, mumps, rubella (MMR) vaccine.** / You need at least 1 dose of MMR if you were born in 1957 or later. You may also need a 2nd dose. For females of childbearing age, rubella immunity should be determined. If there is no evidence of immunity, females who are not pregnant should be vaccinated. If there is no evidence of immunity, females who are pregnant should delay immunization until after pregnancy.  Pneumococcal 13-valent conjugate (PCV13) vaccine.** / Consult your health care provider.  Pneumococcal polysaccharide (PPSV23) vaccine.** / 1 to 2 doses if you smoke cigarettes or if you have certain conditions.  Meningococcal vaccine.** / Consult your health care provider.  Hepatitis A vaccine.** / Consult your health care provider.  Hepatitis B vaccine.** / Consult your health care provider.  Haemophilus influenzae type b (Hib) vaccine.** / Consult your health care provider. Ages 48 years  and over  Blood pressure check.** / Every 1 to 2 years.  Lipid and cholesterol check.** / Every 5 years beginning at age 84 years.  Lung cancer screening. / Every year if you are aged 50-80 years and have a 30-pack-year history of smoking and currently smoke or have quit within the past 15 years. Yearly screening is stopped once you have quit smoking for at least 15 years or develop a health problem that would prevent you from having lung cancer  treatment.  Clinical breast exam.** / Every year after age 24 years.  BRCA-related cancer risk assessment.** / For women who have family members with a BRCA-related cancer (breast, ovarian, tubal, or peritoneal cancers).  Mammogram.** / Every year beginning at age 14 years and continuing for as long as you are in good health. Consult with your health care provider.  Pap test.** / Every 3 years starting at age 17 years through age 31 or 74 years with 3 consecutive normal Pap tests. Testing can be stopped between 65 and 70 years with 3 consecutive normal Pap tests and no abnormal Pap or HPV tests in the past 10 years.  HPV screening.** / Every 3 years from ages 30 years through ages 70 or 28 years with a history of 3 consecutive normal Pap tests. Testing can be stopped between 65 and 70 years with 3 consecutive normal Pap tests and no abnormal Pap or HPV tests in the past 10 years.  Fecal occult blood test (FOBT) of stool. / Every year beginning at age 64 years and continuing until age 92 years. You may not need to do this test if you get a colonoscopy every 10 years.  Flexible sigmoidoscopy or colonoscopy.** / Every 5 years for a flexible sigmoidoscopy or every 10 years for a colonoscopy beginning at age 73 years and continuing until age 39 years.  Hepatitis C blood test.** / For all people born from [birth year] through 1967 and any individual with known risks for hepatitis C.  Osteoporosis screening.** / A one-time screening for women ages 35 years and over and women at risk for fractures or osteoporosis.  Skin self-exam. / Monthly.  Influenza vaccine. / Every year.  Tetanus, diphtheria, and acellular pertussis (Tdap/Td) vaccine.** / 1 dose of Td every 10 years.  Varicella vaccine.** / Consult your health care provider.  Zoster vaccine.** / 1 dose for adults aged 59 years or older.  Pneumococcal 13-valent conjugate (PCV13) vaccine.** / Consult your health care provider.  Pneumococcal  polysaccharide (PPSV23) vaccine.** / 1 dose for all adults aged 8 years and older.  Meningococcal vaccine.** / Consult your health care provider.  Hepatitis A vaccine.** / Consult your health care provider.  Hepatitis B vaccine.** / Consult your health care provider.  Haemophilus influenzae type b (Hib) vaccine.** / Consult your health care provider. ** Family history and personal history of risk and conditions may change your health care provider's recommendations. Document Released: 01/22/2002 Document Revised: 12/01/2013 Document Reviewed: 04/23/2011 [facility] Patient Information 2015 [place], [place]. This information is not intended to replace advice given to you by your health care provider. Make sure you discuss any questions you have with your health care provider.

## 2014-06-17 NOTE — Progress Notes (Signed)
Subjective:     Brenda Clark is a 63 y.o. female who is here for a comprehensive physical exam. The patient reports no problems. She denies any postmenopausal vaginal bleeding. She is not sexually active. She denies urinary incontinence.  History   Social History  . Marital Status: Divorced    Spouse Name: N/A    Number of Children: 2  . Years of Education: HS   Occupational History  . DISABLED     since 2007   Social History Main Topics  . Smoking status: Never Smoker   . Smokeless tobacco: Never Used  . Alcohol Use: No  . Drug Use: No  . Sexual Activity: Not on file   Other Topics Concern  . Not on file   Social History Narrative   Patient with obesity, hyperventilation and obstructive apneas, successfully treated with CPAP at 10 cm water. Good residual AHI values for the last year and low Epworth score. Compliance is good. [facility] follows her.    Her GERD is better on omeprazole and this has further reduced her nocturnal awakenings. She is still hypertensive and needs urgently to lose weight, this would positivley affect her diabetes as well.       I convinced her to take her Topamax and take her Lasix in the AM at 20 mg. She was advised of low carb diet and potassium rich foods.       Patient lives at home alone.   Caffeine Use: occasionally   Health Maintenance  Topic Date Due  . Zostavax  01/31/2009  . Pneumococcal Polysaccharide Vaccine Age 92 And Over  01/31/2014  . Mammogram  03/12/2014  . Influenza Vaccine  07/10/2014  . Tetanus/tdap  02/08/2016  . Colonoscopy  03/05/2024   Past Medical History  Diagnosis Date  . Iron deficiency anemia, unspecified     ice pica  . Other and unspecified hyperlipidemia   . Unspecified essential hypertension   . Retinal detachment   . Migraine, unspecified, without mention of intractable migraine without mention of status migrainosus   . Type II or unspecified type diabetes mellitus without mention of complication,  not stated as uncontrolled   . Osteoarthrosis, unspecified whether generalized or localized, lower leg   . Morbid obesity   . Pulmonary embolus 2012  . GERD (gastroesophageal reflux disease)   . Headache, migraine   . Chronic pain   . OSA (obstructive sleep apnea)   . Hyperlipidemia   . Arthritis     DDD and HNP on MRI in 2008  . Endometrial polyp 08/2010    biopsied.   . Hiatal hernia 03/05/2014  . 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 Cataract extraction    . Lumbar disc surgery    . Tubal ligation    . Colonoscopy with propofol N/A 03/05/2014    Procedure: COLONOSCOPY WITH PROPOFOL;  Surgeon: Iva Boop, MD;  Location: [facility] ENDOSCOPY;  Service: Endoscopy;  Laterality: N/A;  . Esophagogastroduodenoscopy (egd) with propofol N/A 03/05/2014    Procedure: ESOPHAGOGASTRODUODENOSCOPY (EGD) WITH PROPOFOL;  Surgeon: Iva Boop, MD;  Location: [facility] ENDOSCOPY;  Service: Endoscopy;  Laterality: N/A;   Family History  Problem Relation Age of Onset  . Colon cancer Father   . Breast cancer Sister   . Diabetes Sister    History  Substance Use Topics  . Smoking status: Never Smoker   . Smokeless tobacco: Never Used  . Alcohol Use: No  Review of Systems A comprehensive review of systems was negative.   Objective:      GENERAL: Well-developed, well-nourished female in no acute distress. Obese. Uses a cane to ambulate HEENT: Normocephalic, atraumatic. Sclerae anicteric.  NECK: Supple. Normal thyroid.  LUNGS: Clear to auscultation bilaterally.  HEART: Regular rate and rhythm. BREASTS: Symmetric in size. No palpable masses or lymphadenopathy, skin changes, or nipple drainage. ABDOMEN: Soft, nontender, nondistended. No organomegaly. Obese PELVIC: Normal external female genitalia. Vagina is pink and rugated.  Normal discharge. Normal appearing cervix. 1 cm polyp visible extending from cervical os. Bimanual exam extremely limited secondary  to body habitus EXTREMITIES: No cyanosis, clubbing, or edema, 2+ distal pulses.    Assessment:    Healthy female exam.      Plan:    pap smear collected 3 cm polyp removed without difficulty using a ring forceps Screening mammogram referral provided Patient advised to perform monthly breast exam and vulva exam Patient will be contacted with any abnormal results RTC prn See After Visit Summary for Counseling Recommendations

## 2014-06-18 LAB — CYTOLOGY - PAP

## 2014-06-21 ENCOUNTER — Telehealth: Payer: Self-pay

## 2014-06-21 NOTE — Telephone Encounter (Signed)
Patient called back and left message stating she is returning our call.  Called patient and informed her of results. Patient verbalized understanding and had no questions

## 2014-06-21 NOTE — Telephone Encounter (Signed)
Message copied by Louanna RawAMPBELL, Yen Wandell M on Mon Jun 21, 2014  3:54 PM ------      Message from: CONSTANT, PEGGY      Created: Mon Jun 21, 2014  1:29 PM       Please inform patient of normal pap smear and benign polyp removed ------

## 2014-06-21 NOTE — Telephone Encounter (Signed)
Attempted to call patient. Left messages at both numbers stating we are calling with results, please call clinic.

## 2014-06-24 ENCOUNTER — Ambulatory Visit: Payer: PRIVATE HEALTH INSURANCE | Admitting: Internal Medicine

## 2014-07-01 ENCOUNTER — Ambulatory Visit: Payer: PRIVATE HEALTH INSURANCE

## 2014-07-09 ENCOUNTER — Ambulatory Visit (INDEPENDENT_AMBULATORY_CARE_PROVIDER_SITE_OTHER): Payer: PRIVATE HEALTH INSURANCE | Admitting: Pharmacist

## 2014-07-09 DIAGNOSIS — Z7901 Long term (current) use of anticoagulants: Secondary | ICD-10-CM

## 2014-07-09 DIAGNOSIS — I2699 Other pulmonary embolism without acute cor pulmonale: Secondary | ICD-10-CM

## 2014-07-09 LAB — POCT INR: INR: 2.2

## 2014-07-22 ENCOUNTER — Encounter: Payer: Self-pay | Admitting: Internal Medicine

## 2014-07-22 ENCOUNTER — Ambulatory Visit: Payer: PRIVATE HEALTH INSURANCE | Attending: Internal Medicine | Admitting: Internal Medicine

## 2014-07-22 VITALS — BP 165/85 | HR 68 | Temp 97.8°F | Resp 18 | Wt 383.0 lb

## 2014-07-22 DIAGNOSIS — D509 Iron deficiency anemia, unspecified: Secondary | ICD-10-CM | POA: Diagnosis not present

## 2014-07-22 DIAGNOSIS — E785 Hyperlipidemia, unspecified: Secondary | ICD-10-CM | POA: Insufficient documentation

## 2014-07-22 DIAGNOSIS — Z7901 Long term (current) use of anticoagulants: Secondary | ICD-10-CM | POA: Diagnosis not present

## 2014-07-22 DIAGNOSIS — M1990 Unspecified osteoarthritis, unspecified site: Secondary | ICD-10-CM | POA: Insufficient documentation

## 2014-07-22 DIAGNOSIS — K219 Gastro-esophageal reflux disease without esophagitis: Secondary | ICD-10-CM | POA: Diagnosis not present

## 2014-07-22 DIAGNOSIS — R609 Edema, unspecified: Secondary | ICD-10-CM | POA: Diagnosis not present

## 2014-07-22 DIAGNOSIS — I10 Essential (primary) hypertension: Secondary | ICD-10-CM | POA: Insufficient documentation

## 2014-07-22 DIAGNOSIS — E119 Type 2 diabetes mellitus without complications: Secondary | ICD-10-CM | POA: Diagnosis not present

## 2014-07-22 DIAGNOSIS — R600 Localized edema: Secondary | ICD-10-CM | POA: Insufficient documentation

## 2014-07-22 DIAGNOSIS — G4733 Obstructive sleep apnea (adult) (pediatric): Secondary | ICD-10-CM | POA: Diagnosis not present

## 2014-07-22 LAB — COMPLETE METABOLIC PANEL WITH GFR
ALT: 8 U/L (ref 0–35)
AST: 9 U/L (ref 0–37)
Albumin: 3.9 g/dL (ref 3.5–5.2)
Alkaline Phosphatase: 78 U/L (ref 39–117)
BUN: 8 mg/dL (ref 6–23)
CO2: 27 meq/L (ref 19–32)
CREATININE: 0.63 mg/dL (ref 0.50–1.10)
Calcium: 9.1 mg/dL (ref 8.4–10.5)
Chloride: 103 mEq/L (ref 96–112)
Glucose, Bld: 97 mg/dL (ref 70–99)
Potassium: 4.1 mEq/L (ref 3.5–5.3)
Sodium: 138 mEq/L (ref 135–145)
Total Bilirubin: 0.3 mg/dL (ref 0.2–1.2)
Total Protein: 7 g/dL (ref 6.0–8.3)

## 2014-07-22 LAB — LIPID PANEL
CHOLESTEROL: 228 mg/dL — AB (ref 0–200)
HDL: 56 mg/dL (ref >39)
LDL Cholesterol: 150 mg/dL — ABNORMAL HIGH (ref 0–99)
TRIGLYCERIDES: 110 mg/dL (ref <150)
Total CHOL/HDL Ratio: 4.1 Ratio
VLDL: 22 mg/dL (ref 0–40)

## 2014-07-22 LAB — CBC WITH DIFFERENTIAL/PLATELET
BASOS ABS: 0 10*3/uL (ref 0.0–0.1)
Basophils Relative: 0 % (ref 0–1)
EOS PCT: 2 % (ref 0–5)
Eosinophils Absolute: 0.1 10*3/uL (ref 0.0–0.7)
HCT: 37.7 % (ref 36.0–46.0)
HEMOGLOBIN: 12.6 g/dL (ref 12.0–15.0)
LYMPHS PCT: 30 % (ref 12–46)
Lymphs Abs: 1.2 10*3/uL (ref 0.7–4.0)
MCH: 26.3 pg (ref 26.0–34.0)
MCHC: 33.4 g/dL (ref 30.0–36.0)
MCV: 78.7 fL (ref 78.0–100.0)
MONO ABS: 0.3 10*3/uL (ref 0.1–1.0)
Monocytes Relative: 7 % (ref 3–12)
Neutro Abs: 2.5 10*3/uL (ref 1.7–7.7)
Neutrophils Relative %: 61 % (ref 43–77)
Platelets: 244 10*3/uL (ref 150–400)
RBC: 4.79 MIL/uL (ref 3.87–5.11)
RDW: 15.7 % — AB (ref 11.5–15.5)
WBC: 4.1 10*3/uL (ref 4.0–10.5)

## 2014-07-22 LAB — GLUCOSE, POCT (MANUAL RESULT ENTRY): POC GLUCOSE: 107 mg/dL — AB (ref 70–99)

## 2014-07-22 MED ORDER — LISINOPRIL 10 MG PO TABS: 10.0000 mg | ORAL_TABLET | Freq: Every day | ORAL | Status: DC

## 2014-07-22 MED ORDER — FERROUS SULFATE 325 (65 FE) MG PO TABS: 325.0000 mg | ORAL_TABLET | Freq: Three times a day (TID) | ORAL | Status: DC

## 2014-07-22 MED ORDER — OMEPRAZOLE 20 MG PO CPDR: 40.0000 mg | DELAYED_RELEASE_CAPSULE | Freq: Every day | ORAL | Status: DC

## 2014-07-22 MED ORDER — FUROSEMIDE 20 MG PO TABS: 20.0000 mg | ORAL_TABLET | Freq: Every day | ORAL | Status: DC

## 2014-07-22 MED ORDER — METFORMIN HCL ER 500 MG PO TB24: 500.0000 mg | ORAL_TABLET | Freq: Every day | ORAL | Status: DC

## 2014-07-22 MED ORDER — POTASSIUM CHLORIDE CRYS ER 20 MEQ PO TBCR: 20.0000 meq | EXTENDED_RELEASE_TABLET | Freq: Two times a day (BID) | ORAL | Status: DC

## 2014-07-22 MED ORDER — AMLODIPINE BESYLATE 10 MG PO TABS: 10.0000 mg | ORAL_TABLET | Freq: Every day | ORAL | Status: DC

## 2014-07-22 NOTE — Progress Notes (Signed)
MRN: [id number] Name: Brenda Clark  Sex: female Age: 63 y.o. DOB: [birth year]  Allergies: Review of patient's allergies indicates no known allergies.  Chief Complaint  Patient presents with  . Follow-up    DM and needing fasting labs    HPI: Patient is 63 y.o. female who has to of hypertension, diabetes, GERD, anemia comes today for followup denies any acute symptoms her blood pressure is elevated, as per patient she has not taking blood pressure medication today, denies any headache dizziness chest and shortness of breath, she does report pedal edema denies any orthopnea or PND as per patient she used to be on Lasix and is requesting the prescription. Her diabetes is well controlled, her anemia is much improved.   Past Medical History  Diagnosis Date  . Iron deficiency anemia, unspecified     ice pica  . Other and unspecified hyperlipidemia   . Unspecified essential hypertension   . Retinal detachment   . Migraine, unspecified, without mention of intractable migraine without mention of status migrainosus   . Type II or unspecified type diabetes mellitus without mention of complication, not stated as uncontrolled   . Osteoarthrosis, unspecified whether generalized or localized, lower leg   . Morbid obesity   . Pulmonary embolus 2012  . GERD (gastroesophageal reflux disease)   . Headache, migraine   . Chronic pain   . OSA (obstructive sleep apnea)   . Hyperlipidemia   . Arthritis     DDD and HNP on MRI in 2008  . Endometrial polyp 08/2010    biopsied.   . Hiatal hernia 03/05/2014  . 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 Cataract extraction    . Lumbar disc surgery    . Tubal ligation    . Colonoscopy with propofol N/A 03/05/2014    Procedure: COLONOSCOPY WITH PROPOFOL;  Surgeon: Gatha Mayer, MD;  Location: [address];  Service: Endoscopy;  Laterality: N/A;  . Esophagogastroduodenoscopy (egd) with propofol N/A 03/05/2014   Procedure: ESOPHAGOGASTRODUODENOSCOPY (EGD) WITH PROPOFOL;  Surgeon: Gatha Mayer, MD;  Location: [place];  Service: Endoscopy;  Laterality: N/A;      Medication List       This list is accurate as of: 07/22/14 10:54 AM.  Always use your most recent med list.               ACCU-CHEK AVIVA PLUS test strip  Generic drug:  glucose blood  1 each by Other route as needed.     ACCU-CHEK SOFTCLIX LANCETS lancets  1 each as needed.     acetaminophen 650 MG CR tablet  Commonly known as:  TYLENOL  Take 1,300 mg by mouth every 8 (eight) hours as needed for pain.     amLODipine 10 MG tablet  Commonly known as:  NORVASC  Take 1 tablet (10 mg total) by mouth daily.     diphenhydramine-acetaminophen 25-500 MG Tabs  Commonly known as:  TYLENOL PM  Take 1 tablet by mouth at bedtime as needed (for sleep).     ferrous sulfate 325 (65 FE) MG tablet  Take 1 tablet (325 mg total) by mouth 3 (three) times daily with meals.     furosemide 20 MG tablet  Commonly known as:  LASIX  Take 1 tablet (20 mg total) by mouth daily.     glucose monitoring kit monitoring kit  1 each by Does not apply route as needed for other. Dispense  any model that is covered- dispense testing supplies for Q AC/ HS accuchecks- 1 month supply with 6 refills.     lisinopril 10 MG tablet  Commonly known as:  PRINIVIL,ZESTRIL  Take 1 tablet (10 mg total) by mouth daily.     metFORMIN 500 MG 24 hr tablet  Commonly known as:  GLUCOPHAGE-XR  Take 1 tablet (500 mg total) by mouth daily with breakfast.     nitroGLYCERIN 0.4 MG SL tablet  Commonly known as:  NITROSTAT  Place 0.4 mg under the tongue every 5 (five) minutes as needed.     omeprazole 20 MG capsule  Commonly known as:  PRILOSEC  Take 2 capsules (40 mg total) by mouth daily.     potassium chloride SA 20 MEQ tablet  Commonly known as:  KLOR-CON M20  Take 1 tablet (20 mEq total) by mouth 2 (two) times daily.     warfarin 7.5 MG tablet  Commonly  known as:  COUMADIN  Take 1 tablet (7.5 mg total) by mouth See admin instructions. Take 7.5 mg daily and go to the coumadin clinic on 3/30 for further dose adjustment.        Meds ordered this encounter  Medications  . amLODipine (NORVASC) 10 MG tablet    Sig: Take 1 tablet (10 mg total) by mouth daily.    Dispense:  30 tablet    Refill:  6  . ferrous sulfate 325 (65 FE) MG tablet    Sig: Take 1 tablet (325 mg total) by mouth 3 (three) times daily with meals.    Dispense:  90 tablet    Refill:  0  . lisinopril (PRINIVIL,ZESTRIL) 10 MG tablet    Sig: Take 1 tablet (10 mg total) by mouth daily.    Dispense:  30 tablet    Refill:  6  . metFORMIN (GLUCOPHAGE-XR) 500 MG 24 hr tablet    Sig: Take 1 tablet (500 mg total) by mouth daily with breakfast.    Dispense:  60 tablet    Refill:  6  . omeprazole (PRILOSEC) 20 MG capsule    Sig: Take 2 capsules (40 mg total) by mouth daily.    Dispense:  30 capsule    Refill:  6  . furosemide (LASIX) 20 MG tablet    Sig: Take 1 tablet (20 mg total) by mouth daily.    Dispense:  30 tablet    Refill:  3  . potassium chloride SA (KLOR-CON M20) 20 MEQ tablet    Sig: Take 1 tablet (20 mEq total) by mouth 2 (two) times daily.    Dispense:  30 tablet    Refill:  3    Immunization History  Administered Date(s) Administered  . Influenza Whole 09/09/2006  . Pneumococcal Polysaccharide-23 09/09/2005  . Td 02/07/2006    Family History  Problem Relation Age of Onset  . Colon cancer Father   . Breast cancer Sister   . Diabetes Sister     History  Substance Use Topics  . Smoking status: Never Smoker   . Smokeless tobacco: Never Used  . Alcohol Use: No    Review of Systems   As noted in HPI  Filed Vitals:   07/22/14 1034  BP: 165/85  Pulse: 68  Temp: 97.8 F (36.6 C)  Resp: 18    Physical Exam  Physical Exam  Constitutional: No distress.  Obese female sitting comfortably not in acute distress   Eyes: EOM are normal. Pupils  are equal, round,  and reactive to light.  Neck: Neck supple.  Cardiovascular: Normal rate and regular rhythm.   Pulmonary/Chest: Breath sounds normal. No respiratory distress. She has no wheezes. She has no rales.  Musculoskeletal: She exhibits edema.    CBC    Component Value Date/Time   WBC 4.7 06/02/2014 0918   RBC 4.54 06/02/2014 0918   RBC 3.26* 03/02/2014 1220   HGB 12.3 06/02/2014 0918   HCT 37.3 06/02/2014 0918   PLT 269 06/02/2014 0918   MCV 82.2 06/02/2014 0918   LYMPHSABS 1.1 06/02/2014 0918   MONOABS 0.2 06/02/2014 0918   EOSABS 0.0 06/02/2014 0918   BASOSABS 0.0 06/02/2014 0918    CMP     Component Value Date/Time   NA 137 06/02/2014 0918   K 4.4 06/02/2014 0918   CL 104 06/02/2014 0918   CO2 24 06/02/2014 0918   GLUCOSE 103* 06/02/2014 0918   BUN 11 06/02/2014 0918   CREATININE 0.59 06/02/2014 0918   CREATININE 0.58 03/02/2014 1220   CALCIUM 9.0 06/02/2014 0918   PROT 6.9 06/02/2014 0918   ALBUMIN 3.7 06/02/2014 0918   AST 15 06/02/2014 0918   ALT 8 06/02/2014 0918   ALKPHOS 84 06/02/2014 0918   BILITOT 0.2 06/02/2014 0918   GFRNONAA >89 06/02/2014 0918   GFRNONAA >90 03/02/2014 1220   GFRAA >89 06/02/2014 0918   GFRAA >90 03/02/2014 1220    Lab Results  Component Value Date/Time   CHOL 179 03/01/2014  9:08 AM    No components found with this basename: hga1c    Lab Results  Component Value Date/Time   AST 15 06/02/2014  9:18 AM    Assessment and Plan  Type II or unspecified type diabetes mellitus without mention of complication, not stated as uncontrolled - Plan:  Results for orders placed in visit on 07/22/14  GLUCOSE, POCT (MANUAL RESULT ENTRY)      Result Value Ref Range   POC Glucose 107 (*) 70 - 99 mg/dl   Her last A1c is 5.7%, continue with metformin and diabetes meal planning. Will repeat Lipid panel  Essential hypertension - Plan: Blood pressure is uncontrolled her repeat blood chemistry COMPLETE METABOLIC PANEL WITH GFR, I have started patient on  furosemide (LASIX) 20 MG tablet, potassium chloride SA (KLOR-CON M20) 20 MEQ tablet, continue with lisinopril amlodipine.  Gastroesophageal reflux disease, esophagitis presence not specified Lifestyle modification, currently patient is on Prilosec.  Pedal edema - Plan: furosemide (LASIX) 20 MG tablet, advised patient for leg elevation.  Anemia, iron deficiency - Plan: Will repeat her CBC with Differential, continue with iron supplement.   Return in about 3 months (around 10/22/2014) for diabetes, hypertension.  Lorayne Marek, MD

## 2014-07-22 NOTE — Patient Instructions (Signed)
DASH Eating Plan °DASH stands for "Dietary Approaches to Stop Hypertension." The DASH eating plan is a healthy eating plan that has been shown to reduce high blood pressure (hypertension). Additional health benefits may include reducing the risk of type 2 diabetes mellitus, heart disease, and stroke. The DASH eating plan may also help with weight loss. °WHAT DO I NEED TO KNOW ABOUT THE DASH EATING PLAN? °For the DASH eating plan, you will follow these general guidelines: °· Choose foods with a percent daily value for sodium of less than 5% (as listed on the food label). °· Use salt-free seasonings or herbs instead of table salt or sea salt. °· Check with your health care provider or pharmacist before using salt substitutes. °· Eat lower-sodium products, often labeled as "lower sodium" or "no salt added." °· Eat fresh foods. °· Eat more vegetables, fruits, and low-fat dairy products. °· Choose whole grains. Look for the word "whole" as the first word in the ingredient list. °· Choose fish and skinless chicken or turkey more often than red meat. Limit fish, poultry, and meat to 6 oz (170 g) each day. °· Limit sweets, desserts, sugars, and sugary drinks. °· Choose heart-healthy fats. °· Limit cheese to 1 oz (28 g) per day. °· Eat more home-cooked food and less restaurant, buffet, and fast food. °· Limit fried foods. °· Cook foods using methods other than frying. °· Limit canned vegetables. If you do use them, rinse them well to decrease the sodium. °· When eating at a restaurant, ask that your food be prepared with less salt, or no salt if possible. °WHAT FOODS CAN I EAT? °Seek help from a dietitian for individual calorie needs. °Grains °Whole grain or whole wheat bread. Brown rice. Whole grain or whole wheat pasta. Quinoa, bulgur, and whole grain cereals. Low-sodium cereals. Corn or whole wheat flour tortillas. Whole grain cornbread. Whole grain crackers. Low-sodium crackers. °Vegetables °Fresh or frozen vegetables  (raw, steamed, roasted, or grilled). Low-sodium or reduced-sodium tomato and vegetable juices. Low-sodium or reduced-sodium tomato sauce and paste. Low-sodium or reduced-sodium canned vegetables.  °Fruits °All fresh, canned (in natural juice), or frozen fruits. °Meat and Other Protein Products °Ground beef (85% or leaner), grass-fed beef, or beef trimmed of fat. Skinless chicken or turkey. Ground chicken or turkey. Pork trimmed of fat. All fish and seafood. Eggs. Dried beans, peas, or lentils. Unsalted nuts and seeds. Unsalted canned beans. °Dairy °Low-fat dairy products, such as skim or 1% milk, 2% or reduced-fat cheeses, low-fat ricotta or cottage cheese, or plain low-fat yogurt. Low-sodium or reduced-sodium cheeses. °Fats and Oils °Tub margarines without trans fats. Light or reduced-fat mayonnaise and salad dressings (reduced sodium). Avocado. Safflower, olive, or canola oils. Natural peanut or almond butter. °Other °Unsalted popcorn and pretzels. °The items listed above may not be a complete list of recommended foods or beverages. Contact your dietitian for more options. °WHAT FOODS ARE NOT RECOMMENDED? °Grains °White bread. White pasta. White rice. Refined cornbread. Bagels and croissants. Crackers that contain trans fat. °Vegetables °Creamed or fried vegetables. Vegetables in a cheese sauce. Regular canned vegetables. Regular canned tomato sauce and paste. Regular tomato and vegetable juices. °Fruits °Dried fruits. Canned fruit in light or heavy syrup. Fruit juice. °Meat and Other Protein Products °Fatty cuts of meat. Ribs, chicken wings, bacon, sausage, bologna, salami, chitterlings, fatback, hot dogs, bratwurst, and packaged luncheon meats. Salted nuts and seeds. Canned beans with salt. °Dairy °Whole or 2% milk, cream, half-and-half, and cream cheese. Whole-fat or sweetened yogurt. Full-fat   cheeses or blue cheese. Nondairy creamers and whipped toppings. Processed cheese, cheese spreads, or cheese  curds. °Condiments °Onion and garlic salt, seasoned salt, table salt, and sea salt. Canned and packaged gravies. Worcestershire sauce. Tartar sauce. Barbecue sauce. Teriyaki sauce. Soy sauce, including reduced sodium. Steak sauce. Fish sauce. Oyster sauce. Cocktail sauce. Horseradish. Ketchup and mustard. Meat flavorings and tenderizers. Bouillon cubes. Hot sauce. Tabasco sauce. Marinades. Taco seasonings. Relishes. °Fats and Oils °Butter, stick margarine, lard, shortening, ghee, and bacon fat. Coconut, palm kernel, or palm oils. Regular salad dressings. °Other °Pickles and olives. Salted popcorn and pretzels. °The items listed above may not be a complete list of foods and beverages to avoid. Contact your dietitian for more information. °WHERE CAN I FIND MORE INFORMATION? °National [employer], [employer], [employer] Institute: www.nhlbi.nih.gov/health/health-topics/topics/dash/ °Document Released: 11/15/2011 Document Revised: 04/12/2014 Document Reviewed: 09/30/2013 °[employer]® Patient Information ©2015 [employer]. This information is not intended to replace advice given to you by your health care provider. Make sure you discuss any questions you have with your health care provider. ° °

## 2014-07-22 NOTE — Progress Notes (Signed)
Pt is here for f/u on DM and needing fasting labs Also needing refills on meds Alert w/no signs of acute distress.

## 2014-07-26 ENCOUNTER — Telehealth: Payer: Self-pay | Admitting: *Deleted

## 2014-07-26 NOTE — Telephone Encounter (Signed)
Left message

## 2014-07-26 NOTE — Telephone Encounter (Signed)
Message copied by Raynelle CharyWINFREE, Jehan Ranganathan R on Mon Jul 26, 2014 12:01 PM ------      Message from: Doris CheadleADVANI, DEEPAK      Created: Fri Jul 23, 2014 12:45 PM       noticed hyperlipidemia, since patient is diabetic will start on statins, advise patient for low fat diet and  to start taking Lipitor 20 mg daily. Will repeat lipid panel in 3 months.       ------

## 2014-07-27 ENCOUNTER — Telehealth: Payer: Self-pay

## 2014-07-27 ENCOUNTER — Telehealth: Payer: Self-pay | Admitting: Cardiology

## 2014-07-27 MED ORDER — ATORVASTATIN CALCIUM 20 MG PO TABS: 20.0000 mg | ORAL_TABLET | Freq: Every day | ORAL | Status: DC

## 2014-07-27 NOTE — Telephone Encounter (Signed)
Patient not available Message left on voice mail to return our call 

## 2014-07-27 NOTE — Telephone Encounter (Signed)
Message copied by Lestine MountJUAREZ, Kingslee Dowse L on Tue Jul 27, 2014  3:07 PM ------      Message from: Doris CheadleADVANI, DEEPAK      Created: Fri Jul 23, 2014 12:45 PM       noticed hyperlipidemia, since patient is diabetic will start on statins, advise patient for low fat diet and  to start taking Lipitor 20 mg daily. Will repeat lipid panel in 3 months.       ------

## 2014-07-28 NOTE — Telephone Encounter (Signed)
Close encounter 

## 2014-07-29 ENCOUNTER — Ambulatory Visit: Payer: PRIVATE HEALTH INSURANCE | Admitting: Cardiology

## 2014-07-30 ENCOUNTER — Telehealth: Payer: Self-pay | Admitting: Internal Medicine

## 2014-07-30 ENCOUNTER — Telehealth: Payer: Self-pay | Admitting: *Deleted

## 2014-07-30 NOTE — Telephone Encounter (Signed)
Pt is aware of her lab results and said that she would pick up the medication on Monday.

## 2014-07-30 NOTE — Telephone Encounter (Signed)
Pt. Calling nurse back about results. Please f/u with pt.  °

## 2014-08-03 ENCOUNTER — Encounter: Payer: Self-pay | Admitting: General Practice

## 2014-08-26 ENCOUNTER — Ambulatory Visit (INDEPENDENT_AMBULATORY_CARE_PROVIDER_SITE_OTHER): Payer: PRIVATE HEALTH INSURANCE

## 2014-08-26 DIAGNOSIS — I2699 Other pulmonary embolism without acute cor pulmonale: Secondary | ICD-10-CM

## 2014-08-26 DIAGNOSIS — Z7901 Long term (current) use of anticoagulants: Secondary | ICD-10-CM

## 2014-08-26 LAB — POCT INR: INR: 2.2

## 2014-10-11 ENCOUNTER — Encounter: Payer: Self-pay | Admitting: Internal Medicine

## 2014-12-12 ENCOUNTER — Emergency Department (HOSPITAL_COMMUNITY): Payer: PRIVATE HEALTH INSURANCE

## 2014-12-12 ENCOUNTER — Emergency Department (HOSPITAL_COMMUNITY)
Admission: EM | Admit: 2014-12-12 | Discharge: 2014-12-12 | Disposition: A | Discharge status: Home / Self Care | Payer: PRIVATE HEALTH INSURANCE | Attending: Emergency Medicine | Admitting: Emergency Medicine

## 2014-12-12 ENCOUNTER — Encounter (HOSPITAL_COMMUNITY): Payer: Self-pay | Admitting: *Deleted

## 2014-12-12 DIAGNOSIS — K219 Gastro-esophageal reflux disease without esophagitis: Secondary | ICD-10-CM | POA: Insufficient documentation

## 2014-12-12 DIAGNOSIS — D509 Iron deficiency anemia, unspecified: Secondary | ICD-10-CM | POA: Insufficient documentation

## 2014-12-12 DIAGNOSIS — Z86010 Personal history of colonic polyps: Secondary | ICD-10-CM | POA: Insufficient documentation

## 2014-12-12 DIAGNOSIS — Y998 Other external cause status: Secondary | ICD-10-CM | POA: Insufficient documentation

## 2014-12-12 DIAGNOSIS — Y9289 Other specified places as the place of occurrence of the external cause: Secondary | ICD-10-CM | POA: Diagnosis not present

## 2014-12-12 DIAGNOSIS — E785 Hyperlipidemia, unspecified: Secondary | ICD-10-CM | POA: Insufficient documentation

## 2014-12-12 DIAGNOSIS — W108XXA Fall (on) (from) other stairs and steps, initial encounter: Secondary | ICD-10-CM | POA: Insufficient documentation

## 2014-12-12 DIAGNOSIS — G8929 Other chronic pain: Secondary | ICD-10-CM | POA: Diagnosis not present

## 2014-12-12 DIAGNOSIS — Y9389 Activity, other specified: Secondary | ICD-10-CM | POA: Diagnosis not present

## 2014-12-12 DIAGNOSIS — Z79899 Other long term (current) drug therapy: Secondary | ICD-10-CM | POA: Insufficient documentation

## 2014-12-12 DIAGNOSIS — M1990 Unspecified osteoarthritis, unspecified site: Secondary | ICD-10-CM | POA: Diagnosis not present

## 2014-12-12 DIAGNOSIS — W19XXXA Unspecified fall, initial encounter: Secondary | ICD-10-CM

## 2014-12-12 DIAGNOSIS — E119 Type 2 diabetes mellitus without complications: Secondary | ICD-10-CM | POA: Insufficient documentation

## 2014-12-12 DIAGNOSIS — S3992XA Unspecified injury of lower back, initial encounter: Secondary | ICD-10-CM | POA: Insufficient documentation

## 2014-12-12 DIAGNOSIS — M545 Low back pain, unspecified: Secondary | ICD-10-CM

## 2014-12-12 DIAGNOSIS — I10 Essential (primary) hypertension: Secondary | ICD-10-CM | POA: Insufficient documentation

## 2014-12-12 DIAGNOSIS — Z7901 Long term (current) use of anticoagulants: Secondary | ICD-10-CM | POA: Diagnosis not present

## 2014-12-12 DIAGNOSIS — Z86711 Personal history of pulmonary embolism: Secondary | ICD-10-CM | POA: Insufficient documentation

## 2014-12-12 DIAGNOSIS — G43909 Migraine, unspecified, not intractable, without status migrainosus: Secondary | ICD-10-CM | POA: Diagnosis not present

## 2014-12-12 MED ORDER — CYCLOBENZAPRINE HCL 10 MG PO TABS: 10.0000 mg | ORAL_TABLET | Freq: Three times a day (TID) | ORAL | Status: DC | PRN

## 2014-12-12 MED ORDER — HYDROCODONE-ACETAMINOPHEN 5-325 MG PO TABS
1.0000 | ORAL_TABLET | Freq: Four times a day (QID) | ORAL | Status: DC | PRN
Start: 2014-12-12 — End: 2016-01-19

## 2014-12-12 MED ORDER — HYDROCODONE-ACETAMINOPHEN 5-325 MG PO TABS
1.0000 | ORAL_TABLET | Freq: Once | ORAL | Status: AC
  Administered 2014-12-12: 1 via ORAL
  Filled 2014-12-12: qty 1

## 2014-12-12 NOTE — Discharge Instructions (Signed)
Return here as needed. Your x-rays did not show any broken bones but showed significant arthritis.

## 2014-12-12 NOTE — ED Provider Notes (Signed)
CSN: [id number]     Arrival date & time 12/12/14  1226 History  This chart was scribed for non-physician practitioner, Irena Cords, PA-C, working with Blanchie Dessert, MD, by Stephania Fragmin, ED Scribe. This patient was seen in room TR11C/TR11C and the patient's care was started at 2:02 PM.    Chief Complaint  Patient presents with  . Fall    The history is provided by the patient. No language interpreter was used.     HPI Comments: Brenda Clark is a 64 y.o. female who presents to the Emergency Department complaining of right lower back pain S/P a fall that occurred when patient slipped on the steps 9 days ago on Christmas Day, landing on her bottom. She states that movement, including sitting and standing up, exacerbates the pain. Patient has a history of hypertension and DM. She takes Warfarin.   Past Medical History  Diagnosis Date  . Iron deficiency anemia, unspecified     ice pica  . Other and unspecified hyperlipidemia   . Unspecified essential hypertension   . Retinal detachment   . Migraine, unspecified, without mention of intractable migraine without mention of status migrainosus   . Type II or unspecified type diabetes mellitus without mention of complication, not stated as uncontrolled   . Osteoarthrosis, unspecified whether generalized or localized, lower leg   . Morbid obesity   . Pulmonary embolus 2012  . GERD (gastroesophageal reflux disease)   . Headache, migraine   . Chronic pain   . OSA (obstructive sleep apnea)   . Hyperlipidemia   . Arthritis     DDD and HNP on MRI in 2008  . Endometrial polyp 08/2010    biopsied.   . Hiatal hernia 03/05/2014  . 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 Cataract extraction    . Lumbar disc surgery    . Tubal ligation    . Colonoscopy with propofol N/A 03/05/2014    Procedure: COLONOSCOPY WITH PROPOFOL;  Surgeon: Gatha Mayer, MD;  Location: [place];  Service: Endoscopy;  Laterality:  N/A;  . Esophagogastroduodenoscopy (egd) with propofol N/A 03/05/2014    Procedure: ESOPHAGOGASTRODUODENOSCOPY (EGD) WITH PROPOFOL;  Surgeon: Gatha Mayer, MD;  Location: [place];  Service: Endoscopy;  Laterality: N/A;   Family History  Problem Relation Age of Onset  . Colon cancer Father   . Breast cancer Sister   . Diabetes Sister    History  Substance Use Topics  . Smoking status: Never Smoker   . Smokeless tobacco: Never Used  . Alcohol Use: No   OB History    Gravida Para Term Preterm AB TAB SAB Ectopic Multiple Living   _0 0 1 0 1 0 0 2     Review of Systems  Musculoskeletal: Positive for myalgias and back pain.  All other systems reviewed and are negative.     Allergies  Review of patient's allergies indicates no known allergies.  Home Medications   Prior to Admission medications   Medication Sig Start Date End Date Taking? Authorizing Provider  ACCU-CHEK AVIVA PLUS test strip 1 each by Other route as needed.  02/27/14   Historical Provider, MD  ACCU-CHEK SOFTCLIX LANCETS lancets 1 each as needed.  02/27/14   Historical Provider, MD  acetaminophen (TYLENOL) 650 MG CR tablet Take 1,300 mg by mouth every 8 (eight) hours as needed for pain.    Historical Provider, MD  amLODipine (NORVASC) 10 MG tablet Take  1 tablet (10 mg total) by mouth daily. 07/22/14   Lorayne Marek, MD  atorvastatin (LIPITOR) 20 MG tablet Take 1 tablet (20 mg total) by mouth daily. 07/27/14   Lorayne Marek, MD  diphenhydramine-acetaminophen (TYLENOL PM) 25-500 MG TABS Take 1 tablet by mouth at bedtime as needed (for sleep).    Historical Provider, MD  ferrous sulfate 325 (65 FE) MG tablet Take 1 tablet (325 mg total) by mouth 3 (three) times daily with meals. 07/22/14   Lorayne Marek, MD  furosemide (LASIX) 20 MG tablet Take 1 tablet (20 mg total) by mouth daily. 07/22/14   Lorayne Marek, MD  glucose monitoring kit (FREESTYLE) monitoring kit 1 each by Does not apply route as needed for other.  Dispense any model that is covered- dispense testing supplies for Q AC/ HS accuchecks- 1 month supply with 6 refills. 02/27/14   Debbe Odea, MD  lisinopril (PRINIVIL,ZESTRIL) 10 MG tablet Take 1 tablet (10 mg total) by mouth daily. 07/22/14   Lorayne Marek, MD  metFORMIN (GLUCOPHAGE-XR) 500 MG 24 hr tablet Take 1 tablet (500 mg total) by mouth daily with breakfast. 07/22/14   Lorayne Marek, MD  nitroGLYCERIN (NITROSTAT) 0.4 MG SL tablet Place 0.4 mg under the tongue every 5 (five) minutes as needed.      Historical Provider, MD  omeprazole (PRILOSEC) 20 MG capsule Take 2 capsules (40 mg total) by mouth daily. 07/22/14   Lorayne Marek, MD  potassium chloride SA (KLOR-CON M20) 20 MEQ tablet Take 1 tablet (20 mEq total) by mouth 2 (two) times daily. 07/22/14   Lorayne Marek, MD  warfarin (COUMADIN) 7.5 MG tablet Take 1 tablet (7.5 mg total) by mouth See admin instructions. Take 7.5 mg daily and go to the coumadin clinic on 3/30 for further dose adjustment. 03/07/14   Shanker Kristeen Mans, MD   BP 140/90 mmHg  Pulse 84  Temp(Src) 97.8 F (36.6 C) (Oral)  Resp 16  SpO2 96% Physical Exam  Constitutional: She is oriented to person, place, and time. She appears well-developed and well-nourished. No distress.  HENT:  Head: Normocephalic and atraumatic.  Eyes: Pupils are equal, round, and reactive to light.  Cardiovascular: Normal rate, regular rhythm and normal heart sounds.   Pulmonary/Chest: Effort normal and breath sounds normal. No respiratory distress.  Musculoskeletal: Normal range of motion.       Lumbar back: She exhibits tenderness and pain. She exhibits no bony tenderness, no swelling, no deformity and no spasm.       Back:  Neurological: She is alert and oriented to person, place, and time. No sensory deficit. She exhibits normal muscle tone. Coordination and gait normal. GCS eye subscore is 4. GCS verbal subscore is 5. GCS motor subscore is 6.  Reflex Scores:      Patellar reflexes are 2+ on  the right side and 2+ on the left side.      Achilles reflexes are 2+ on the right side and 2+ on the left side. Skin: Skin is warm and dry.  Psychiatric: She has a normal mood and affect. Her behavior is normal.  Nursing note and vitals reviewed.   ED Course  Procedures (including critical care time)  DIAGNOSTIC STUDIES: Oxygen Saturation is 96% on room air, normal by my interpretation.    COORDINATION OF CARE: 2:04 PM - Discussed treatment plan with pt at bedside which includes XR and pt agreed to plan.   Imaging Review Dg Lumbar Spine Complete  12/12/2014   CLINICAL DATA:  Right-sided lower back pain since fall on Christmas day.  EXAM: LUMBAR SPINE - COMPLETE 4+ VIEW  COMPARISON:  Prior MRI of the lumbar spine on 07/07/2007 and myelogram of the lumbar spine on 07/16/2007.  FINDINGS: Lumbar spondylosis shows progression since prior imaging in 2008 with progressive disc space narrowing and proliferative changes at L2-3, L3-4 and L4-5. All levels show vacuum disc. Spondylosis at L1-2 is relatively stable. There likely is mild progression of moderate spondylosis at L5-S1. No fracture or subluxation is identified. No bony lesions are seen.  IMPRESSION: No evidence of lumbar fracture. Progression of multilevel lumbar spondylosis since 2008 at L2-3, L3-4 and L4-5. Moderate spondylosis also present at L5-S1.   Electronically Signed   By: Aletta Edouard M.D.   On: 12/12/2014 15:29   Patient be referred back to her primary care Dr. told to return here as needed.  Advised to use ice and heat on her lower back   I personally performed the services described in this documentation, which was scribed in my presence. The recorded information has been reviewed and is accurate.    Brent General, PA-C 12/12/14 1537  Blanchie Dessert, MD 12/13/14 1531

## 2014-12-12 NOTE — ED Notes (Signed)
Pt reports having a fall on christmas and having right side lower back pain since the fall.

## 2014-12-30 ENCOUNTER — Ambulatory Visit: Payer: Medicaid Other | Admitting: Internal Medicine

## 2015-01-11 DIAGNOSIS — G4733 Obstructive sleep apnea (adult) (pediatric): Secondary | ICD-10-CM | POA: Diagnosis not present

## 2015-01-14 ENCOUNTER — Other Ambulatory Visit: Payer: Self-pay | Admitting: Internal Medicine

## 2015-01-17 ENCOUNTER — Ambulatory Visit: Payer: Medicaid Other | Attending: Internal Medicine | Admitting: Internal Medicine

## 2015-01-17 ENCOUNTER — Encounter: Payer: Self-pay | Admitting: Internal Medicine

## 2015-01-17 VITALS — BP 168/89 | HR 87 | Temp 98.0°F | Resp 16 | Wt 392.8 lb

## 2015-01-17 DIAGNOSIS — G4733 Obstructive sleep apnea (adult) (pediatric): Secondary | ICD-10-CM | POA: Diagnosis not present

## 2015-01-17 DIAGNOSIS — E785 Hyperlipidemia, unspecified: Secondary | ICD-10-CM | POA: Insufficient documentation

## 2015-01-17 DIAGNOSIS — I2699 Other pulmonary embolism without acute cor pulmonale: Secondary | ICD-10-CM | POA: Insufficient documentation

## 2015-01-17 DIAGNOSIS — E119 Type 2 diabetes mellitus without complications: Secondary | ICD-10-CM | POA: Diagnosis not present

## 2015-01-17 DIAGNOSIS — K219 Gastro-esophageal reflux disease without esophagitis: Secondary | ICD-10-CM | POA: Diagnosis not present

## 2015-01-17 DIAGNOSIS — K449 Diaphragmatic hernia without obstruction or gangrene: Secondary | ICD-10-CM | POA: Diagnosis not present

## 2015-01-17 DIAGNOSIS — I10 Essential (primary) hypertension: Secondary | ICD-10-CM | POA: Insufficient documentation

## 2015-01-17 DIAGNOSIS — Z79899 Other long term (current) drug therapy: Secondary | ICD-10-CM | POA: Insufficient documentation

## 2015-01-17 DIAGNOSIS — Z86711 Personal history of pulmonary embolism: Secondary | ICD-10-CM | POA: Diagnosis not present

## 2015-01-17 DIAGNOSIS — R600 Localized edema: Secondary | ICD-10-CM

## 2015-01-17 DIAGNOSIS — Z7901 Long term (current) use of anticoagulants: Secondary | ICD-10-CM | POA: Insufficient documentation

## 2015-01-17 DIAGNOSIS — E139 Other specified diabetes mellitus without complications: Secondary | ICD-10-CM

## 2015-01-17 DIAGNOSIS — I8290 Acute embolism and thrombosis of unspecified vein: Secondary | ICD-10-CM

## 2015-01-17 LAB — COMPLETE METABOLIC PANEL WITH GFR
ALT: 10 U/L (ref 0–35)
AST: 14 U/L (ref 0–37)
Albumin: 3.7 g/dL (ref 3.5–5.2)
Alkaline Phosphatase: 90 U/L (ref 39–117)
BILIRUBIN TOTAL: 0.3 mg/dL (ref 0.2–1.2)
BUN: 11 mg/dL (ref 6–23)
CALCIUM: 9 mg/dL (ref 8.4–10.5)
CO2: 25 mEq/L (ref 19–32)
CREATININE: 0.53 mg/dL (ref 0.50–1.10)
Chloride: 104 mEq/L (ref 96–112)
GFR, Est African American: >89 mL/min
GLUCOSE: 113 mg/dL — AB (ref 70–99)
POTASSIUM: 4.7 meq/L (ref 3.5–5.3)
Sodium: 139 mEq/L (ref 135–145)
Total Protein: 6.8 g/dL (ref 6.0–8.3)

## 2015-01-17 LAB — LIPID PANEL
CHOL/HDL RATIO: 3 ratio
Cholesterol: 182 mg/dL (ref 0–200)
HDL: 60 mg/dL (ref >39)
LDL Cholesterol: 106 mg/dL — ABNORMAL HIGH (ref 0–99)
Triglycerides: 80 mg/dL (ref <150)
VLDL: 16 mg/dL (ref 0–40)

## 2015-01-17 LAB — GLUCOSE, POCT (MANUAL RESULT ENTRY): POC Glucose: 111 mg/dl — AB (ref 70–99)

## 2015-01-17 LAB — POCT INR: INR: 2.5

## 2015-01-17 LAB — POCT GLYCOSYLATED HEMOGLOBIN (HGB A1C): HEMOGLOBIN A1C: 6.4

## 2015-01-17 MED ORDER — AMLODIPINE BESYLATE 10 MG PO TABS: 10.0000 mg | ORAL_TABLET | Freq: Every day | ORAL | Status: DC

## 2015-01-17 MED ORDER — FUROSEMIDE 20 MG PO TABS: 20.0000 mg | ORAL_TABLET | Freq: Every day | ORAL | Status: DC

## 2015-01-17 MED ORDER — LISINOPRIL 10 MG PO TABS: 10.0000 mg | ORAL_TABLET | Freq: Every day | ORAL | Status: DC

## 2015-01-17 MED ORDER — OMEPRAZOLE 20 MG PO CPDR: 40.0000 mg | DELAYED_RELEASE_CAPSULE | Freq: Every day | ORAL | Status: DC

## 2015-01-17 MED ORDER — ATORVASTATIN CALCIUM 20 MG PO TABS: 20.0000 mg | ORAL_TABLET | Freq: Every day | ORAL | Status: DC

## 2015-01-17 MED ORDER — POTASSIUM CHLORIDE CRYS ER 20 MEQ PO TBCR: 20.0000 meq | EXTENDED_RELEASE_TABLET | Freq: Every day | ORAL | Status: DC

## 2015-01-17 NOTE — Progress Notes (Signed)
MRN: [id number] Name: Brenda Clark  Sex: female Age: 64 y.o. DOB: 1951/04/24  Allergies: Review of patient's allergies indicates no known allergies.  Chief Complaint  Patient presents with  . Follow-up    HPI: Patient is 64 y.o. female who history of diabetes hypertension, hyperlipidemia, blood clots including pulmonary embolism as per patient she has been following up with Coumadin clinic and has been taking her medication, she wants to get her INR checked today, which is therapeutic , she has not taken her blood pressure medication today she is requesting refill on the medication. Patient denies any acute symptoms, has is she of arthritis and needs the note for  Housing that is difficult for her to climb the stairs, patient is also morbidly obese.  Past Medical History  Diagnosis Date  . Iron deficiency anemia, unspecified     ice pica  . Other and unspecified hyperlipidemia   . Unspecified essential hypertension   . Retinal detachment   . Migraine, unspecified, without mention of intractable migraine without mention of status migrainosus   . Type II or unspecified type diabetes mellitus without mention of complication, not stated as uncontrolled   . Osteoarthrosis, unspecified whether generalized or localized, lower leg   . Morbid obesity   . Pulmonary embolus 2012  . GERD (gastroesophageal reflux disease)   . Headache, migraine   . Chronic pain   . OSA (obstructive sleep apnea)   . Hyperlipidemia   . Arthritis     DDD and HNP on MRI in 2008  . Endometrial polyp 08/2010    biopsied.   . Hiatal hernia 03/05/2014  . 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 Cataract extraction    . Lumbar disc surgery    . Tubal ligation    . Colonoscopy with propofol N/A 03/05/2014    Procedure: COLONOSCOPY WITH PROPOFOL;  Surgeon: Gatha Mayer, MD;  Location: [place];  Service: Endoscopy;  Laterality: N/A;  . Esophagogastroduodenoscopy  (egd) with propofol N/A 03/05/2014    Procedure: ESOPHAGOGASTRODUODENOSCOPY (EGD) WITH PROPOFOL;  Surgeon: Gatha Mayer, MD;  Location: [place];  Service: Endoscopy;  Laterality: N/A;      Medication List       This list is accurate as of: 01/17/15  9:39 AM.  Always use your most recent med list.               ACCU-CHEK AVIVA PLUS test strip  Generic drug:  glucose blood  1 each by Other route as needed.     ACCU-CHEK SOFTCLIX LANCETS lancets  1 each as needed.     acetaminophen 650 MG CR tablet  Commonly known as:  TYLENOL  Take 1,300 mg by mouth every 8 (eight) hours as needed for pain.     amLODipine 10 MG tablet  Commonly known as:  NORVASC  Take 1 tablet (10 mg total) by mouth daily.     atorvastatin 20 MG tablet  Commonly known as:  LIPITOR  Take 1 tablet (20 mg total) by mouth daily.     cyclobenzaprine 10 MG tablet  Commonly known as:  FLEXERIL  Take 1 tablet (10 mg total) by mouth 3 (three) times daily as needed for muscle spasms.     diphenhydramine-acetaminophen 25-500 MG Tabs  Commonly known as:  TYLENOL PM  Take 1 tablet by mouth at bedtime as needed (for sleep).     ferrous sulfate 325 (65 FE) MG  tablet  Take 1 tablet (325 mg total) by mouth 3 (three) times daily with meals.     furosemide 20 MG tablet  Commonly known as:  LASIX  Take 1 tablet (20 mg total) by mouth daily.     glucose monitoring kit monitoring kit  1 each by Does not apply route as needed for other. Dispense any model that is covered- dispense testing supplies for Q AC/ HS accuchecks- 1 month supply with 6 refills.     HYDROcodone-acetaminophen 5-325 MG per tablet  Commonly known as:  NORCO/VICODIN  Take 1 tablet by mouth every 6 (six) hours as needed.     lisinopril 10 MG tablet  Commonly known as:  PRINIVIL,ZESTRIL  Take 1 tablet (10 mg total) by mouth daily.     metFORMIN 500 MG 24 hr tablet  Commonly known as:  GLUCOPHAGE-XR  Take 1 tablet (500 mg total) by mouth  daily with breakfast.     nitroGLYCERIN 0.4 MG SL tablet  Commonly known as:  NITROSTAT  Place 0.4 mg under the tongue every 5 (five) minutes as needed.     omeprazole 20 MG capsule  Commonly known as:  PRILOSEC  Take 2 capsules (40 mg total) by mouth daily.     potassium chloride SA 20 MEQ tablet  Commonly known as:  KLOR-CON M20  Take 1 tablet (20 mEq total) by mouth daily.     warfarin 7.5 MG tablet  Commonly known as:  COUMADIN  Take 1 tablet (7.5 mg total) by mouth See admin instructions. Take 7.5 mg daily and go to the coumadin clinic on 3/30 for further dose adjustment.        Meds ordered this encounter  Medications  . amLODipine (NORVASC) 10 MG tablet    Sig: Take 1 tablet (10 mg total) by mouth daily.    Dispense:  30 tablet    Refill:  6  . furosemide (LASIX) 20 MG tablet    Sig: Take 1 tablet (20 mg total) by mouth daily.    Dispense:  30 tablet    Refill:  3  . lisinopril (PRINIVIL,ZESTRIL) 10 MG tablet    Sig: Take 1 tablet (10 mg total) by mouth daily.    Dispense:  30 tablet    Refill:  6  . atorvastatin (LIPITOR) 20 MG tablet    Sig: Take 1 tablet (20 mg total) by mouth daily.    Dispense:  90 tablet    Refill:  3  . omeprazole (PRILOSEC) 20 MG capsule    Sig: Take 2 capsules (40 mg total) by mouth daily.    Dispense:  30 capsule    Refill:  6  . potassium chloride SA (KLOR-CON M20) 20 MEQ tablet    Sig: Take 1 tablet (20 mEq total) by mouth daily.    Dispense:  30 tablet    Refill:  3    Immunization History  Administered Date(s) Administered  . Influenza Whole 09/09/2006  . Pneumococcal Polysaccharide-23 09/09/2005  . Td 02/07/2006    Family History  Problem Relation Age of Onset  . Colon cancer Father   . Breast cancer Sister   . Diabetes Sister     History  Substance Use Topics  . Smoking status: Never Smoker   . Smokeless tobacco: Never Used  . Alcohol Use: No    Review of Systems   As noted in HPI  Filed Vitals:    01/17/15 0916  BP: 168/89  Pulse: 87  Temp: 98 F (36.7 C)  Resp: 16    Physical Exam  Physical Exam  Constitutional:  Obese female not in acute distress   Eyes: EOM are normal. Pupils are equal, round, and reactive to light.  Cardiovascular: Normal rate and regular rhythm.   Pulmonary/Chest: Breath sounds normal. No respiratory distress. She has no wheezes. She has no rales.  Musculoskeletal:  Trace pedal edema     CBC    Component Value Date/Time   WBC 4.1 07/22/2014 1105   RBC 4.79 07/22/2014 1105   RBC 3.26* 03/02/2014 1220   HGB 12.6 07/22/2014 1105   HCT 37.7 07/22/2014 1105   PLT 244 07/22/2014 1105   MCV 78.7 07/22/2014 1105   LYMPHSABS 1.2 07/22/2014 1105   MONOABS 0.3 07/22/2014 1105   EOSABS 0.1 07/22/2014 1105   BASOSABS 0.0 07/22/2014 1105    CMP     Component Value Date/Time   NA 138 07/22/2014 1105   K 4.1 07/22/2014 1105   CL 103 07/22/2014 1105   CO2 27 07/22/2014 1105   GLUCOSE 97 07/22/2014 1105   BUN 8 07/22/2014 1105   CREATININE 0.63 07/22/2014 1105   CREATININE 0.58 03/02/2014 1220   CALCIUM 9.1 07/22/2014 1105   PROT 7.0 07/22/2014 1105   ALBUMIN 3.9 07/22/2014 1105   AST 9 07/22/2014 1105   ALT 8 07/22/2014 1105   ALKPHOS 78 07/22/2014 1105   BILITOT 0.3 07/22/2014 1105   GFRNONAA >89 07/22/2014 1105   GFRNONAA >90 03/02/2014 1220   GFRAA >89 07/22/2014 1105   GFRAA >90 03/02/2014 1220    Lab Results  Component Value Date/Time   CHOL 228* 07/22/2014 11:05 AM    No components found for: HGA1C  Lab Results  Component Value Date/Time   AST 9 07/22/2014 11:05 AM    Assessment and Plan  Other specified diabetes mellitus without complications - Plan:  Results for orders placed or performed in visit on 01/17/15  Glucose (CBG)  Result Value Ref Range   POC Glucose 111 (A) 70 - 99 mg/dl  HgB A1c  Result Value Ref Range   Hemoglobin A1C 6.40   POCT INR  Result Value Ref Range   INR 2.5     Glucose (CBG), HgB A1c  Diabetes is fairly well controlled, continue with current meds.  PE/Clot - Plan: POCT INR, is therapeutic, continue with current dose of Coumadin repeat in 6 weeks.  Essential hypertension - Plan:blood pressure today is elevated since she has not taken the medications, resume back on  amLODipine (NORVASC) 10 MG tablet, furosemide (LASIX) 20 MG tablet, lisinopril (PRINIVIL,ZESTRIL) 10 MG tablet, potassium chloride SA (KLOR-CON M20) 20 MEQ tablet,   COMPLETE METABOLIC PANEL WITH GFR  Pedal edema - Plan: furosemide (LASIX) 20 MG tablet, will check blood chemistry.  Gastroesophageal reflux disease, esophagitis presence not specified Lifestyle modification, continue with Prilosec  Hyperlipidemia - Plan: atorvastatin (LIPITOR) 20 MG tablet, will check Lipid panel  Morbid obesity   Health Maintenance  -Vaccinations:  -patient declines flu shot   Return in about 3 months (around 04/17/2015) for diabetes, hypertension, BP check and INR check  in 6 weeks/Nurse Visit.  Lorayne Marek, MD

## 2015-01-17 NOTE — Progress Notes (Signed)
Patient here for follow up on her HTN and diabetes Patient states she is here for a routine check up Patient also requesting a letter stating she can not go up and down steps so that She can get out of her lease early and move to a retirement community

## 2015-01-17 NOTE — Patient Instructions (Signed)
Diabetes Mellitus and Food It is important for you to manage your blood sugar (glucose) level. Your blood glucose level can be greatly affected by what you eat. Eating healthier foods in the appropriate amounts throughout the day at about the same time each day will help you control your blood glucose level. It can also help slow or prevent worsening of your diabetes mellitus. Healthy eating may even help you improve the level of your blood pressure and reach or maintain a healthy weight.  HOW CAN FOOD AFFECT ME? Carbohydrates Carbohydrates affect your blood glucose level more than any other type of food. Your dietitian will help you determine how many carbohydrates to eat at each meal and teach you how to count carbohydrates. Counting carbohydrates is important to keep your blood glucose at a healthy level, especially if you are using insulin or taking certain medicines for diabetes mellitus. Alcohol Alcohol can cause sudden decreases in blood glucose (hypoglycemia), especially if you use insulin or take certain medicines for diabetes mellitus. Hypoglycemia can be a life-threatening condition. Symptoms of hypoglycemia (sleepiness, dizziness, and disorientation) are similar to symptoms of having too much alcohol.  If your health care provider has given you approval to drink alcohol, do so in moderation and use the following guidelines:  Women should not have more than one drink per day, and men should not have more than two drinks per day. One drink is equal to:  12 oz of beer.  5 oz of wine.  1 oz of hard liquor.  Do not drink on an empty stomach.  Keep yourself hydrated. Have water, diet soda, or unsweetened iced tea.  Regular soda, juice, and other mixers might contain a lot of carbohydrates and should be counted. WHAT FOODS ARE NOT RECOMMENDED? As you make food choices, it is important to remember that all foods are not the same. Some foods have fewer nutrients per serving than other  foods, even though they might have the same number of calories or carbohydrates. It is difficult to get your body what it needs when you eat foods with fewer nutrients. Examples of foods that you should avoid that are high in calories and carbohydrates but low in nutrients include:  Trans fats (most processed foods list trans fats on the Nutrition Facts label).  Regular soda.  Juice.  Candy.  Sweets, such as cake, pie, doughnuts, and cookies.  Fried foods. WHAT FOODS CAN I EAT? Have nutrient-rich foods, which will nourish your body and keep you healthy. The food you should eat also will depend on several factors, including:  The calories you need.  The medicines you take.  Your weight.  Your blood glucose level.  Your blood pressure level.  Your cholesterol level. You also should eat a variety of foods, including:  Protein, such as meat, poultry, fish, tofu, nuts, and seeds (lean animal proteins are best).  Fruits.  Vegetables.  Dairy products, such as milk, cheese, and yogurt (low fat is best).  Breads, grains, pasta, cereal, rice, and beans.  Fats such as olive oil, trans fat-free margarine, canola oil, avocado, and olives. DOES EVERYONE WITH DIABETES MELLITUS HAVE THE SAME MEAL PLAN? Because every person with diabetes mellitus is different, there is not one meal plan that works for everyone. It is very important that you meet with a dietitian who will help you create a meal plan that is just right for you. Document Released: 08/23/2005 Document Revised: 12/01/2013 Document Reviewed: 10/23/2013 ExitCare Patient Information 2015 [employer]. This   information is not intended to replace advice given to you by your health care provider. Make sure you discuss any questions you have with your health care provider. DASH Eating Plan DASH stands for "Dietary Approaches to Stop Hypertension." The DASH eating plan is a healthy eating plan that has been shown to reduce high  blood pressure (hypertension). Additional health benefits may include reducing the risk of type 2 diabetes mellitus, heart disease, and stroke. The DASH eating plan may also help with weight loss. WHAT DO I NEED TO KNOW ABOUT THE DASH EATING PLAN? For the DASH eating plan, you will follow these general guidelines:  Choose foods with a percent daily value for sodium of less than 5% (as listed on the food label).  Use salt-free seasonings or herbs instead of table salt or sea salt.  Check with your health care provider or pharmacist before using salt substitutes.  Eat lower-sodium products, often labeled as "lower sodium" or "no salt added."  Eat fresh foods.  Eat more vegetables, fruits, and low-fat dairy products.  Choose whole grains. Look for the word "whole" as the first word in the ingredient list.  Choose fish and skinless chicken or turkey more often than red meat. Limit fish, poultry, and meat to 6 oz (170 g) each day.  Limit sweets, desserts, sugars, and sugary drinks.  Choose heart-healthy fats.  Limit cheese to 1 oz (28 g) per day.  Eat more home-cooked food and less restaurant, buffet, and fast food.  Limit fried foods.  Cook foods using methods other than frying.  Limit canned vegetables. If you do use them, rinse them well to decrease the sodium.  When eating at a restaurant, ask that your food be prepared with less salt, or no salt if possible. WHAT FOODS CAN I EAT? Seek help from a dietitian for individual calorie needs. Grains Whole grain or whole wheat bread. Brown rice. Whole grain or whole wheat pasta. Quinoa, bulgur, and whole grain cereals. Low-sodium cereals. Corn or whole wheat flour tortillas. Whole grain cornbread. Whole grain crackers. Low-sodium crackers. Vegetables Fresh or frozen vegetables (raw, steamed, roasted, or grilled). Low-sodium or reduced-sodium tomato and vegetable juices. Low-sodium or reduced-sodium tomato sauce and paste. Low-sodium  or reduced-sodium canned vegetables.  Fruits All fresh, canned (in natural juice), or frozen fruits. Meat and Other Protein Products Ground beef (85% or leaner), grass-fed beef, or beef trimmed of fat. Skinless chicken or turkey. Ground chicken or turkey. Pork trimmed of fat. All fish and seafood. Eggs. Dried beans, peas, or lentils. Unsalted nuts and seeds. Unsalted canned beans. Dairy Low-fat dairy products, such as skim or 1% milk, 2% or reduced-fat cheeses, low-fat ricotta or cottage cheese, or plain low-fat yogurt. Low-sodium or reduced-sodium cheeses. Fats and Oils Tub margarines without trans fats. Light or reduced-fat mayonnaise and salad dressings (reduced sodium). Avocado. Safflower, olive, or canola oils. Natural peanut or almond butter. Other Unsalted popcorn and pretzels. The items listed above may not be a complete list of recommended foods or beverages. Contact your dietitian for more options. WHAT FOODS ARE NOT RECOMMENDED? Grains White bread. White pasta. White rice. Refined cornbread. Bagels and croissants. Crackers that contain trans fat. Vegetables Creamed or fried vegetables. Vegetables in a cheese sauce. Regular canned vegetables. Regular canned tomato sauce and paste. Regular tomato and vegetable juices. Fruits Dried fruits. Canned fruit in light or heavy syrup. Fruit juice. Meat and Other Protein Products Fatty cuts of meat. Ribs, chicken wings, bacon, sausage, bologna, salami, chitterlings, fatback, hot   dogs, bratwurst, and packaged luncheon meats. Salted nuts and seeds. Canned beans with salt. Dairy Whole or 2% milk, cream, half-and-half, and cream cheese. Whole-fat or sweetened yogurt. Full-fat cheeses or blue cheese. Nondairy creamers and whipped toppings. Processed cheese, cheese spreads, or cheese curds. Condiments Onion and garlic salt, seasoned salt, table salt, and sea salt. Canned and packaged gravies. Worcestershire sauce. Tartar sauce. Barbecue sauce.  Teriyaki sauce. Soy sauce, including reduced sodium. Steak sauce. Fish sauce. Oyster sauce. Cocktail sauce. Horseradish. Ketchup and mustard. Meat flavorings and tenderizers. Bouillon cubes. Hot sauce. Tabasco sauce. Marinades. Taco seasonings. Relishes. Fats and Oils Butter, stick margarine, lard, shortening, ghee, and bacon fat. Coconut, palm kernel, or palm oils. Regular salad dressings. Other Pickles and olives. Salted popcorn and pretzels. The items listed above may not be a complete list of foods and beverages to avoid. Contact your dietitian for more information. WHERE CAN I FIND MORE INFORMATION? National Heart, Lung, and Blood Institute: www.nhlbi.nih.gov/health/health-topics/topics/dash/ Document Released: 11/15/2011 Document Revised: 04/12/2014 Document Reviewed: 09/30/2013 ExitCare Patient Information 2015 [employer]. This information is not intended to replace advice given to you by your health care provider. Make sure you discuss any questions you have with your health care provider.  

## 2015-01-18 ENCOUNTER — Telehealth: Payer: Self-pay

## 2015-01-18 NOTE — Telephone Encounter (Signed)
Patient is aware of her lab results 

## 2015-01-18 NOTE — Telephone Encounter (Signed)
-----   Message from Doris Cheadleeepak Advani, MD sent at 01/18/2015  9:13 AM EST ----- Call and let the patient know the blood work shows improvement in cholesterol level, continue with current meds and advise for low fat  diet.

## 2015-01-29 DIAGNOSIS — E119 Type 2 diabetes mellitus without complications: Secondary | ICD-10-CM | POA: Diagnosis not present

## 2015-01-30 DIAGNOSIS — E119 Type 2 diabetes mellitus without complications: Secondary | ICD-10-CM | POA: Diagnosis not present

## 2015-01-31 DIAGNOSIS — E119 Type 2 diabetes mellitus without complications: Secondary | ICD-10-CM | POA: Diagnosis not present

## 2015-02-01 DIAGNOSIS — E119 Type 2 diabetes mellitus without complications: Secondary | ICD-10-CM | POA: Diagnosis not present

## 2015-02-02 DIAGNOSIS — E119 Type 2 diabetes mellitus without complications: Secondary | ICD-10-CM | POA: Diagnosis not present

## 2015-02-03 DIAGNOSIS — E119 Type 2 diabetes mellitus without complications: Secondary | ICD-10-CM | POA: Diagnosis not present

## 2015-02-04 DIAGNOSIS — E119 Type 2 diabetes mellitus without complications: Secondary | ICD-10-CM | POA: Diagnosis not present

## 2015-02-05 DIAGNOSIS — E119 Type 2 diabetes mellitus without complications: Secondary | ICD-10-CM | POA: Diagnosis not present

## 2015-02-06 DIAGNOSIS — E119 Type 2 diabetes mellitus without complications: Secondary | ICD-10-CM | POA: Diagnosis not present

## 2015-02-08 DIAGNOSIS — E119 Type 2 diabetes mellitus without complications: Secondary | ICD-10-CM | POA: Diagnosis not present

## 2015-02-09 DIAGNOSIS — E119 Type 2 diabetes mellitus without complications: Secondary | ICD-10-CM | POA: Diagnosis not present

## 2015-02-10 DIAGNOSIS — E119 Type 2 diabetes mellitus without complications: Secondary | ICD-10-CM | POA: Diagnosis not present

## 2015-02-11 DIAGNOSIS — E119 Type 2 diabetes mellitus without complications: Secondary | ICD-10-CM | POA: Diagnosis not present

## 2015-02-12 DIAGNOSIS — E119 Type 2 diabetes mellitus without complications: Secondary | ICD-10-CM | POA: Diagnosis not present

## 2015-02-13 DIAGNOSIS — E119 Type 2 diabetes mellitus without complications: Secondary | ICD-10-CM | POA: Diagnosis not present

## 2015-02-14 DIAGNOSIS — E119 Type 2 diabetes mellitus without complications: Secondary | ICD-10-CM | POA: Diagnosis not present

## 2015-02-15 DIAGNOSIS — E119 Type 2 diabetes mellitus without complications: Secondary | ICD-10-CM | POA: Diagnosis not present

## 2015-02-16 DIAGNOSIS — E119 Type 2 diabetes mellitus without complications: Secondary | ICD-10-CM | POA: Diagnosis not present

## 2015-02-17 DIAGNOSIS — E119 Type 2 diabetes mellitus without complications: Secondary | ICD-10-CM | POA: Diagnosis not present

## 2015-02-18 DIAGNOSIS — E119 Type 2 diabetes mellitus without complications: Secondary | ICD-10-CM | POA: Diagnosis not present

## 2015-02-19 DIAGNOSIS — E119 Type 2 diabetes mellitus without complications: Secondary | ICD-10-CM | POA: Diagnosis not present

## 2015-02-20 DIAGNOSIS — E119 Type 2 diabetes mellitus without complications: Secondary | ICD-10-CM | POA: Diagnosis not present

## 2015-02-21 DIAGNOSIS — E119 Type 2 diabetes mellitus without complications: Secondary | ICD-10-CM | POA: Diagnosis not present

## 2015-02-23 DIAGNOSIS — E119 Type 2 diabetes mellitus without complications: Secondary | ICD-10-CM | POA: Diagnosis not present

## 2015-02-24 DIAGNOSIS — E119 Type 2 diabetes mellitus without complications: Secondary | ICD-10-CM | POA: Diagnosis not present

## 2015-02-25 DIAGNOSIS — E119 Type 2 diabetes mellitus without complications: Secondary | ICD-10-CM | POA: Diagnosis not present

## 2015-02-26 DIAGNOSIS — E119 Type 2 diabetes mellitus without complications: Secondary | ICD-10-CM | POA: Diagnosis not present

## 2015-02-27 DIAGNOSIS — E119 Type 2 diabetes mellitus without complications: Secondary | ICD-10-CM | POA: Diagnosis not present

## 2015-02-28 ENCOUNTER — Other Ambulatory Visit: Payer: Medicaid Other

## 2015-02-28 DIAGNOSIS — E119 Type 2 diabetes mellitus without complications: Secondary | ICD-10-CM | POA: Diagnosis not present

## 2015-03-01 DIAGNOSIS — E119 Type 2 diabetes mellitus without complications: Secondary | ICD-10-CM | POA: Diagnosis not present

## 2015-03-02 DIAGNOSIS — E119 Type 2 diabetes mellitus without complications: Secondary | ICD-10-CM | POA: Diagnosis not present

## 2015-03-03 DIAGNOSIS — E119 Type 2 diabetes mellitus without complications: Secondary | ICD-10-CM | POA: Diagnosis not present

## 2015-03-04 DIAGNOSIS — E119 Type 2 diabetes mellitus without complications: Secondary | ICD-10-CM | POA: Diagnosis not present

## 2015-03-05 DIAGNOSIS — E119 Type 2 diabetes mellitus without complications: Secondary | ICD-10-CM | POA: Diagnosis not present

## 2015-03-06 DIAGNOSIS — E119 Type 2 diabetes mellitus without complications: Secondary | ICD-10-CM | POA: Diagnosis not present

## 2015-03-07 DIAGNOSIS — E119 Type 2 diabetes mellitus without complications: Secondary | ICD-10-CM | POA: Diagnosis not present

## 2015-03-08 DIAGNOSIS — E119 Type 2 diabetes mellitus without complications: Secondary | ICD-10-CM | POA: Diagnosis not present

## 2015-03-11 DIAGNOSIS — E119 Type 2 diabetes mellitus without complications: Secondary | ICD-10-CM | POA: Diagnosis not present

## 2015-03-12 DIAGNOSIS — E119 Type 2 diabetes mellitus without complications: Secondary | ICD-10-CM | POA: Diagnosis not present

## 2015-03-13 DIAGNOSIS — E119 Type 2 diabetes mellitus without complications: Secondary | ICD-10-CM | POA: Diagnosis not present

## 2015-03-14 DIAGNOSIS — E119 Type 2 diabetes mellitus without complications: Secondary | ICD-10-CM | POA: Diagnosis not present

## 2015-03-15 ENCOUNTER — Ambulatory Visit: Payer: Self-pay | Admitting: Cardiology

## 2015-03-15 DIAGNOSIS — I2699 Other pulmonary embolism without acute cor pulmonale: Secondary | ICD-10-CM

## 2015-03-15 DIAGNOSIS — E119 Type 2 diabetes mellitus without complications: Secondary | ICD-10-CM | POA: Diagnosis not present

## 2015-03-16 DIAGNOSIS — E119 Type 2 diabetes mellitus without complications: Secondary | ICD-10-CM | POA: Diagnosis not present

## 2015-03-17 DIAGNOSIS — E119 Type 2 diabetes mellitus without complications: Secondary | ICD-10-CM | POA: Diagnosis not present

## 2015-03-18 DIAGNOSIS — E119 Type 2 diabetes mellitus without complications: Secondary | ICD-10-CM | POA: Diagnosis not present

## 2015-03-19 DIAGNOSIS — E119 Type 2 diabetes mellitus without complications: Secondary | ICD-10-CM | POA: Diagnosis not present

## 2015-03-20 DIAGNOSIS — E119 Type 2 diabetes mellitus without complications: Secondary | ICD-10-CM | POA: Diagnosis not present

## 2015-03-21 DIAGNOSIS — E119 Type 2 diabetes mellitus without complications: Secondary | ICD-10-CM | POA: Diagnosis not present

## 2015-03-22 DIAGNOSIS — E119 Type 2 diabetes mellitus without complications: Secondary | ICD-10-CM | POA: Diagnosis not present

## 2015-03-23 DIAGNOSIS — E119 Type 2 diabetes mellitus without complications: Secondary | ICD-10-CM | POA: Diagnosis not present

## 2015-03-24 DIAGNOSIS — E119 Type 2 diabetes mellitus without complications: Secondary | ICD-10-CM | POA: Diagnosis not present

## 2015-03-25 DIAGNOSIS — E119 Type 2 diabetes mellitus without complications: Secondary | ICD-10-CM | POA: Diagnosis not present

## 2015-03-26 DIAGNOSIS — E119 Type 2 diabetes mellitus without complications: Secondary | ICD-10-CM | POA: Diagnosis not present

## 2015-03-27 DIAGNOSIS — E119 Type 2 diabetes mellitus without complications: Secondary | ICD-10-CM | POA: Diagnosis not present

## 2015-03-28 ENCOUNTER — Encounter: Payer: Self-pay | Admitting: Internal Medicine

## 2015-03-28 ENCOUNTER — Ambulatory Visit: Payer: Medicare Other | Attending: Internal Medicine | Admitting: Internal Medicine

## 2015-03-28 VITALS — BP 146/74 | HR 74 | Temp 97.7°F | Resp 15 | Wt 398.0 lb

## 2015-03-28 DIAGNOSIS — I10 Essential (primary) hypertension: Secondary | ICD-10-CM | POA: Diagnosis not present

## 2015-03-28 DIAGNOSIS — K449 Diaphragmatic hernia without obstruction or gangrene: Secondary | ICD-10-CM | POA: Diagnosis not present

## 2015-03-28 DIAGNOSIS — G8929 Other chronic pain: Secondary | ICD-10-CM | POA: Diagnosis not present

## 2015-03-28 DIAGNOSIS — K219 Gastro-esophageal reflux disease without esophagitis: Secondary | ICD-10-CM | POA: Diagnosis not present

## 2015-03-28 DIAGNOSIS — M1990 Unspecified osteoarthritis, unspecified site: Secondary | ICD-10-CM | POA: Diagnosis not present

## 2015-03-28 DIAGNOSIS — D509 Iron deficiency anemia, unspecified: Secondary | ICD-10-CM | POA: Insufficient documentation

## 2015-03-28 DIAGNOSIS — E119 Type 2 diabetes mellitus without complications: Secondary | ICD-10-CM | POA: Insufficient documentation

## 2015-03-28 DIAGNOSIS — E785 Hyperlipidemia, unspecified: Secondary | ICD-10-CM | POA: Diagnosis not present

## 2015-03-28 DIAGNOSIS — Z7901 Long term (current) use of anticoagulants: Secondary | ICD-10-CM | POA: Insufficient documentation

## 2015-03-28 DIAGNOSIS — Z86711 Personal history of pulmonary embolism: Secondary | ICD-10-CM | POA: Insufficient documentation

## 2015-03-28 DIAGNOSIS — I8290 Acute embolism and thrombosis of unspecified vein: Secondary | ICD-10-CM | POA: Diagnosis not present

## 2015-03-28 DIAGNOSIS — Z79899 Other long term (current) drug therapy: Secondary | ICD-10-CM | POA: Insufficient documentation

## 2015-03-28 DIAGNOSIS — G4733 Obstructive sleep apnea (adult) (pediatric): Secondary | ICD-10-CM | POA: Insufficient documentation

## 2015-03-28 DIAGNOSIS — N3941 Urge incontinence: Secondary | ICD-10-CM | POA: Insufficient documentation

## 2015-03-28 DIAGNOSIS — E139 Other specified diabetes mellitus without complications: Secondary | ICD-10-CM | POA: Diagnosis not present

## 2015-03-28 LAB — POCT INR: INR: 2.6

## 2015-03-28 LAB — GLUCOSE, POCT (MANUAL RESULT ENTRY): POC Glucose: 99 mg/dl (ref 70–99)

## 2015-03-28 MED ORDER — LISINOPRIL 20 MG PO TABS: 20.0000 mg | ORAL_TABLET | Freq: Every day | ORAL | Status: DC

## 2015-03-28 NOTE — Patient Instructions (Signed)
Diabetes Mellitus and Food It is important for you to manage your blood sugar (glucose) level. Your blood glucose level can be greatly affected by what you eat. Eating healthier foods in the appropriate amounts throughout the day at about the same time each day will help you control your blood glucose level. It can also help slow or prevent worsening of your diabetes mellitus. Healthy eating may even help you improve the level of your blood pressure and reach or maintain a healthy weight.  HOW CAN FOOD AFFECT ME? Carbohydrates Carbohydrates affect your blood glucose level more than any other type of food. Your dietitian will help you determine how many carbohydrates to eat at each meal and teach you how to count carbohydrates. Counting carbohydrates is important to keep your blood glucose at a healthy level, especially if you are using insulin or taking certain medicines for diabetes mellitus. Alcohol Alcohol can cause sudden decreases in blood glucose (hypoglycemia), especially if you use insulin or take certain medicines for diabetes mellitus. Hypoglycemia can be a life-threatening condition. Symptoms of hypoglycemia (sleepiness, dizziness, and disorientation) are similar to symptoms of having too much alcohol.  If your health care provider has given you approval to drink alcohol, do so in moderation and use the following guidelines:  Women should not have more than one drink per day, and men should not have more than two drinks per day. One drink is equal to:  12 oz of beer.  5 oz of wine.  1 oz of hard liquor.  Do not drink on an empty stomach.  Keep yourself hydrated. Have water, diet soda, or unsweetened iced tea.  Regular soda, juice, and other mixers might contain a lot of carbohydrates and should be counted. WHAT FOODS ARE NOT RECOMMENDED? As you make food choices, it is important to remember that all foods are not the same. Some foods have fewer nutrients per serving than other  foods, even though they might have the same number of calories or carbohydrates. It is difficult to get your body what it needs when you eat foods with fewer nutrients. Examples of foods that you should avoid that are high in calories and carbohydrates but low in nutrients include:  Trans fats (most processed foods list trans fats on the Nutrition Facts label).  Regular soda.  Juice.  Candy.  Sweets, such as cake, pie, doughnuts, and cookies.  Fried foods. WHAT FOODS CAN I EAT? Have nutrient-rich foods, which will nourish your body and keep you healthy. The food you should eat also will depend on several factors, including:  The calories you need.  The medicines you take.  Your weight.  Your blood glucose level.  Your blood pressure level.  Your cholesterol level. You also should eat a variety of foods, including:  Protein, such as meat, poultry, fish, tofu, nuts, and seeds (lean animal proteins are best).  Fruits.  Vegetables.  Dairy products, such as milk, cheese, and yogurt (low fat is best).  Breads, grains, pasta, cereal, rice, and beans.  Fats such as olive oil, trans fat-free margarine, canola oil, avocado, and olives. DOES EVERYONE WITH DIABETES MELLITUS HAVE THE SAME MEAL PLAN? Because every person with diabetes mellitus is different, there is not one meal plan that works for everyone. It is very important that you meet with a dietitian who will help you create a meal plan that is just right for you. Document Released: 08/23/2005 Document Revised: 12/01/2013 Document Reviewed: 10/23/2013 ExitCare Patient Information 2015 [employer]. This   information is not intended to replace advice given to you by your health care provider. Make sure you discuss any questions you have with your health care provider. DASH Eating Plan DASH stands for "Dietary Approaches to Stop Hypertension." The DASH eating plan is a healthy eating plan that has been shown to reduce high  blood pressure (hypertension). Additional health benefits may include reducing the risk of type 2 diabetes mellitus, heart disease, and stroke. The DASH eating plan may also help with weight loss. WHAT DO I NEED TO KNOW ABOUT THE DASH EATING PLAN? For the DASH eating plan, you will follow these general guidelines:  Choose foods with a percent daily value for sodium of less than 5% (as listed on the food label).  Use salt-free seasonings or herbs instead of table salt or sea salt.  Check with your health care provider or pharmacist before using salt substitutes.  Eat lower-sodium products, often labeled as "lower sodium" or "no salt added."  Eat fresh foods.  Eat more vegetables, fruits, and low-fat dairy products.  Choose whole grains. Look for the word "whole" as the first word in the ingredient list.  Choose fish and skinless chicken or turkey more often than red meat. Limit fish, poultry, and meat to 6 oz (170 g) each day.  Limit sweets, desserts, sugars, and sugary drinks.  Choose heart-healthy fats.  Limit cheese to 1 oz (28 g) per day.  Eat more home-cooked food and less restaurant, buffet, and fast food.  Limit fried foods.  Cook foods using methods other than frying.  Limit canned vegetables. If you do use them, rinse them well to decrease the sodium.  When eating at a restaurant, ask that your food be prepared with less salt, or no salt if possible. WHAT FOODS CAN I EAT? Seek help from a dietitian for individual calorie needs. Grains Whole grain or whole wheat bread. Brown rice. Whole grain or whole wheat pasta. Quinoa, bulgur, and whole grain cereals. Low-sodium cereals. Corn or whole wheat flour tortillas. Whole grain cornbread. Whole grain crackers. Low-sodium crackers. Vegetables Fresh or frozen vegetables (raw, steamed, roasted, or grilled). Low-sodium or reduced-sodium tomato and vegetable juices. Low-sodium or reduced-sodium tomato sauce and paste. Low-sodium  or reduced-sodium canned vegetables.  Fruits All fresh, canned (in natural juice), or frozen fruits. Meat and Other Protein Products Ground beef (85% or leaner), grass-fed beef, or beef trimmed of fat. Skinless chicken or turkey. Ground chicken or turkey. Pork trimmed of fat. All fish and seafood. Eggs. Dried beans, peas, or lentils. Unsalted nuts and seeds. Unsalted canned beans. Dairy Low-fat dairy products, such as skim or 1% milk, 2% or reduced-fat cheeses, low-fat ricotta or cottage cheese, or plain low-fat yogurt. Low-sodium or reduced-sodium cheeses. Fats and Oils Tub margarines without trans fats. Light or reduced-fat mayonnaise and salad dressings (reduced sodium). Avocado. Safflower, olive, or canola oils. Natural peanut or almond butter. Other Unsalted popcorn and pretzels. The items listed above may not be a complete list of recommended foods or beverages. Contact your dietitian for more options. WHAT FOODS ARE NOT RECOMMENDED? Grains White bread. White pasta. White rice. Refined cornbread. Bagels and croissants. Crackers that contain trans fat. Vegetables Creamed or fried vegetables. Vegetables in a cheese sauce. Regular canned vegetables. Regular canned tomato sauce and paste. Regular tomato and vegetable juices. Fruits Dried fruits. Canned fruit in light or heavy syrup. Fruit juice. Meat and Other Protein Products Fatty cuts of meat. Ribs, chicken wings, bacon, sausage, bologna, salami, chitterlings, fatback, hot   dogs, bratwurst, and packaged luncheon meats. Salted nuts and seeds. Canned beans with salt. Dairy Whole or 2% milk, cream, half-and-half, and cream cheese. Whole-fat or sweetened yogurt. Full-fat cheeses or blue cheese. Nondairy creamers and whipped toppings. Processed cheese, cheese spreads, or cheese curds. Condiments Onion and garlic salt, seasoned salt, table salt, and sea salt. Canned and packaged gravies. Worcestershire sauce. Tartar sauce. Barbecue sauce.  Teriyaki sauce. Soy sauce, including reduced sodium. Steak sauce. Fish sauce. Oyster sauce. Cocktail sauce. Horseradish. Ketchup and mustard. Meat flavorings and tenderizers. Bouillon cubes. Hot sauce. Tabasco sauce. Marinades. Taco seasonings. Relishes. Fats and Oils Butter, stick margarine, lard, shortening, ghee, and bacon fat. Coconut, palm kernel, or palm oils. Regular salad dressings. Other Pickles and olives. Salted popcorn and pretzels. The items listed above may not be a complete list of foods and beverages to avoid. Contact your dietitian for more information. WHERE CAN I FIND MORE INFORMATION? National Heart, Lung, and Blood Institute: www.nhlbi.nih.gov/health/health-topics/topics/dash/ Document Released: 11/15/2011 Document Revised: 04/12/2014 Document Reviewed: 09/30/2013 ExitCare Patient Information 2015 [employer]. This information is not intended to replace advice given to you by your health care provider. Make sure you discuss any questions you have with your health care provider.  

## 2015-03-28 NOTE — Progress Notes (Signed)
MRN: [id number] Name: Brenda Clark  Sex: female Age: 64 y.o. DOB: 09/27/1951  Allergies: Review of patient's allergies indicates no known allergies.  Chief Complaint  Patient presents with  . Follow-up    HPI: Patient is 64 y.o. female who has history of hypertension diabetes, PE, comes today for followup and requesting refill on her medications, she also reported to have urge incontinence for several months denies any dysuria fever chills nausea vomiting, patient has never been evaluated by urologist, she is compliant in taking her Coumadin, today her INR is therapeutic.also her recent hemoglobin A1c was 6.4%, she's taking metformin denies any hypoglycemic symptoms.her blood pressure is elevated as per patient she is checked at home her blood pressure systolic is in 102H.  Past Medical History  Diagnosis Date  . Iron deficiency anemia, unspecified     ice pica  . Other and unspecified hyperlipidemia   . Unspecified essential hypertension   . Retinal detachment   . Migraine, unspecified, without mention of intractable migraine without mention of status migrainosus   . Type II or unspecified type diabetes mellitus without mention of complication, not stated as uncontrolled   . Osteoarthrosis, unspecified whether generalized or localized, lower leg   . Morbid obesity   . Pulmonary embolus 2012  . GERD (gastroesophageal reflux disease)   . Headache, migraine   . Chronic pain   . OSA (obstructive sleep apnea)   . Hyperlipidemia   . Arthritis     DDD and HNP on MRI in 2008  . Endometrial polyp 08/2010    biopsied.   . Hiatal hernia 03/05/2014  . 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 Cataract extraction    . Lumbar disc surgery    . Tubal ligation    . Colonoscopy with propofol N/A 03/05/2014    Procedure: COLONOSCOPY WITH PROPOFOL;  Surgeon: Gatha Mayer, MD;  Location: [place];  Service: Endoscopy;  Laterality: N/A;  .  Esophagogastroduodenoscopy (egd) with propofol N/A 03/05/2014    Procedure: ESOPHAGOGASTRODUODENOSCOPY (EGD) WITH PROPOFOL;  Surgeon: Gatha Mayer, MD;  Location: [place];  Service: Endoscopy;  Laterality: N/A;      Medication List       This list is accurate as of: 03/28/15 12:51 PM.  Always use your most recent med list.               ACCU-CHEK AVIVA PLUS test strip  Generic drug:  glucose blood  1 each by Other route as needed.     ACCU-CHEK SOFTCLIX LANCETS lancets  1 each as needed.     acetaminophen 650 MG CR tablet  Commonly known as:  TYLENOL  Take 1,300 mg by mouth every 8 (eight) hours as needed for pain.     amLODipine 10 MG tablet  Commonly known as:  NORVASC  Take 1 tablet (10 mg total) by mouth daily.     atorvastatin 20 MG tablet  Commonly known as:  LIPITOR  Take 1 tablet (20 mg total) by mouth daily.     cyclobenzaprine 10 MG tablet  Commonly known as:  FLEXERIL  Take 1 tablet (10 mg total) by mouth 3 (three) times daily as needed for muscle spasms.     diphenhydramine-acetaminophen 25-500 MG Tabs  Commonly known as:  TYLENOL PM  Take 1 tablet by mouth at bedtime as needed (for sleep).     ferrous sulfate 325 (65 FE) MG tablet  Take  1 tablet (325 mg total) by mouth 3 (three) times daily with meals.     furosemide 20 MG tablet  Commonly known as:  LASIX  Take 1 tablet (20 mg total) by mouth daily.     glucose monitoring kit monitoring kit  1 each by Does not apply route as needed for other. Dispense any model that is covered- dispense testing supplies for Q AC/ HS accuchecks- 1 month supply with 6 refills.     HYDROcodone-acetaminophen 5-325 MG per tablet  Commonly known as:  NORCO/VICODIN  Take 1 tablet by mouth every 6 (six) hours as needed.     lisinopril 20 MG tablet  Commonly known as:  PRINIVIL,ZESTRIL  Take 1 tablet (20 mg total) by mouth daily.     metFORMIN 500 MG 24 hr tablet  Commonly known as:  GLUCOPHAGE-XR  Take 1 tablet  (500 mg total) by mouth daily with breakfast.     nitroGLYCERIN 0.4 MG SL tablet  Commonly known as:  NITROSTAT  Place 0.4 mg under the tongue every 5 (five) minutes as needed.     omeprazole 20 MG capsule  Commonly known as:  PRILOSEC  Take 2 capsules (40 mg total) by mouth daily.     potassium chloride SA 20 MEQ tablet  Commonly known as:  KLOR-CON M20  Take 1 tablet (20 mEq total) by mouth daily.     warfarin 7.5 MG tablet  Commonly known as:  COUMADIN  Take 1 tablet (7.5 mg total) by mouth See admin instructions. Take 7.5 mg daily and go to the coumadin clinic on 3/30 for further dose adjustment.        Meds ordered this encounter  Medications  . lisinopril (PRINIVIL,ZESTRIL) 20 MG tablet    Sig: Take 1 tablet (20 mg total) by mouth daily.    Dispense:  30 tablet    Refill:  3    Immunization History  Administered Date(s) Administered  . Influenza Whole 09/09/2006  . Pneumococcal Polysaccharide-23 09/09/2005  . Td 02/07/2006    Family History  Problem Relation Age of Onset  . Colon cancer Father   . Breast cancer Sister   . Diabetes Sister     History  Substance Use Topics  . Smoking status: Never Smoker   . Smokeless tobacco: Never Used  . Alcohol Use: No    Review of Systems   As noted in HPI  Filed Vitals:   03/28/15 1110  BP: 146/74  Pulse: 74  Temp: 97.7 F (36.5 C)  Resp: 15    Physical Exam  Physical Exam  Constitutional: No distress.  Cardiovascular: Normal rate and regular rhythm.   Pulmonary/Chest: No respiratory distress. She has no wheezes. She has no rales.  Abdominal: Soft. There is no tenderness.    CBC    Component Value Date/Time   WBC 4.1 07/22/2014 1105   RBC 4.79 07/22/2014 1105   RBC 3.26* 03/02/2014 1220   HGB 12.6 07/22/2014 1105   HCT 37.7 07/22/2014 1105   PLT 244 07/22/2014 1105   MCV 78.7 07/22/2014 1105   LYMPHSABS 1.2 07/22/2014 1105   MONOABS 0.3 07/22/2014 1105   EOSABS 0.1 07/22/2014 1105    BASOSABS 0.0 07/22/2014 1105    CMP     Component Value Date/Time   NA 139 01/17/2015 0940   K 4.7 01/17/2015 0940   CL 104 01/17/2015 0940   CO2 25 01/17/2015 0940   GLUCOSE 113* 01/17/2015 0940   BUN 11 01/17/2015 0940  CREATININE 0.53 01/17/2015 0940   CREATININE 0.58 03/02/2014 1220   CALCIUM 9.0 01/17/2015 0940   PROT 6.8 01/17/2015 0940   ALBUMIN 3.7 01/17/2015 0940   AST 14 01/17/2015 0940   ALT 10 01/17/2015 0940   ALKPHOS 90 01/17/2015 0940   BILITOT 0.3 01/17/2015 0940   GFRNONAA >89 01/17/2015 0940   GFRNONAA >90 03/02/2014 1220   GFRAA >89 01/17/2015 0940   GFRAA >90 03/02/2014 1220    Lab Results  Component Value Date/Time   CHOL 182 01/17/2015 09:40 AM    Lab Results  Component Value Date/Time   HGBA1C 6.40 01/17/2015 09:25 AM   HGBA1C 5.7* 03/02/2014 12:20 PM    Lab Results  Component Value Date/Time   AST 14 01/17/2015 09:40 AM    Assessment and Plan  Other specified diabetes mellitus without complications - Plan:  Results for orders placed or performed in visit on 03/28/15  Glucose (CBG)  Result Value Ref Range   POC Glucose 99 70 - 99 mg/dl  INR  Result Value Ref Range   INR 2.6    Recent hemoglobin A1c was 6.4%, continue with metformin will repeat A1c on the following visit.  Pulmonary embolism/ Clot - Plan: INR therapeutic, continue with current dose of Coumadin. Recheck in 6 weeks.  Essential hypertension - Plan:patient will continue her with amlodipine, Lasix, I have increased the dose of lisinopril  lisinopril (PRINIVIL,ZESTRIL) 20 MG tablet, she'll come back in 2 weeks for BP check, will repeat blood chemistry on the following visit.  Urge urinary incontinence - Plan: Urinalysis with Culture Reflex, Ambulatory referral to Urology    Return in about 3 months (around 06/27/2015) for diabetes, hypertension, BP check in 2 weeks/Nurse Visit.   This note has been created with Automotive engineer. Any transcriptional errors are unintentional.    Lorayne Marek, MD

## 2015-03-28 NOTE — Progress Notes (Signed)
Patient here for routine follow up her diabetes and inr Patient is requesting a prescription for Depends briefs so that her  Insurance will help cover the cost

## 2015-03-29 DIAGNOSIS — E119 Type 2 diabetes mellitus without complications: Secondary | ICD-10-CM | POA: Diagnosis not present

## 2015-03-29 LAB — URINALYSIS W MICROSCOPIC + REFLEX CULTURE
BACTERIA UA: NONE SEEN
BILIRUBIN URINE: NEGATIVE
CASTS: NONE SEEN
Crystals: NONE SEEN
Glucose, UA: NEGATIVE mg/dL
HGB URINE DIPSTICK: NEGATIVE
KETONES UR: NEGATIVE mg/dL
Leukocytes, UA: NEGATIVE
Nitrite: NEGATIVE
Protein, ur: NEGATIVE mg/dL
SPECIFIC GRAVITY, URINE: 1.026 (ref 1.005–1.030)
Urobilinogen, UA: 0.2 mg/dL (ref 0.0–1.0)
pH: 6 (ref 5.0–8.0)

## 2015-03-30 ENCOUNTER — Telehealth: Payer: Self-pay

## 2015-03-30 DIAGNOSIS — E119 Type 2 diabetes mellitus without complications: Secondary | ICD-10-CM | POA: Diagnosis not present

## 2015-03-30 NOTE — Telephone Encounter (Signed)
-----   Message from Doris Cheadleeepak Advani, MD sent at 03/29/2015  9:24 AM EDT ----- Call and let the patient know that UA is negative for infection.

## 2015-03-30 NOTE — Telephone Encounter (Signed)
Patient is aware of her UA results

## 2015-03-31 DIAGNOSIS — E119 Type 2 diabetes mellitus without complications: Secondary | ICD-10-CM | POA: Diagnosis not present

## 2015-04-01 DIAGNOSIS — E119 Type 2 diabetes mellitus without complications: Secondary | ICD-10-CM | POA: Diagnosis not present

## 2015-04-02 DIAGNOSIS — E119 Type 2 diabetes mellitus without complications: Secondary | ICD-10-CM | POA: Diagnosis not present

## 2015-04-03 DIAGNOSIS — E119 Type 2 diabetes mellitus without complications: Secondary | ICD-10-CM | POA: Diagnosis not present

## 2015-04-04 DIAGNOSIS — E119 Type 2 diabetes mellitus without complications: Secondary | ICD-10-CM | POA: Diagnosis not present

## 2015-04-05 DIAGNOSIS — E119 Type 2 diabetes mellitus without complications: Secondary | ICD-10-CM | POA: Diagnosis not present

## 2015-04-06 DIAGNOSIS — E119 Type 2 diabetes mellitus without complications: Secondary | ICD-10-CM | POA: Diagnosis not present

## 2015-04-07 DIAGNOSIS — E119 Type 2 diabetes mellitus without complications: Secondary | ICD-10-CM | POA: Diagnosis not present

## 2015-04-08 DIAGNOSIS — E119 Type 2 diabetes mellitus without complications: Secondary | ICD-10-CM | POA: Diagnosis not present

## 2015-04-09 DIAGNOSIS — E119 Type 2 diabetes mellitus without complications: Secondary | ICD-10-CM | POA: Diagnosis not present

## 2015-04-10 DIAGNOSIS — E119 Type 2 diabetes mellitus without complications: Secondary | ICD-10-CM | POA: Diagnosis not present

## 2015-04-11 DIAGNOSIS — E119 Type 2 diabetes mellitus without complications: Secondary | ICD-10-CM | POA: Diagnosis not present

## 2015-04-12 ENCOUNTER — Ambulatory Visit: Payer: Medicare Other | Attending: Internal Medicine | Admitting: *Deleted

## 2015-04-12 VITALS — BP 120/66 | HR 80 | Temp 98.1°F | Resp 16

## 2015-04-12 DIAGNOSIS — I10 Essential (primary) hypertension: Secondary | ICD-10-CM

## 2015-04-12 DIAGNOSIS — E119 Type 2 diabetes mellitus without complications: Secondary | ICD-10-CM | POA: Diagnosis not present

## 2015-04-12 NOTE — Patient Instructions (Signed)
DASH Eating Plan °DASH stands for "Dietary Approaches to Stop Hypertension." The DASH eating plan is a healthy eating plan that has been shown to reduce high blood pressure (hypertension). Additional health benefits may include reducing the risk of type 2 diabetes mellitus, heart disease, and stroke. The DASH eating plan may also help with weight loss. °WHAT DO I NEED TO KNOW ABOUT THE DASH EATING PLAN? °For the DASH eating plan, you will follow these general guidelines: °· Choose foods with a percent daily value for sodium of less than 5% (as listed on the food label). °· Use salt-free seasonings or herbs instead of table salt or sea salt. °· Check with your health care provider or pharmacist before using salt substitutes. °· Eat lower-sodium products, often labeled as "lower sodium" or "no salt added." °· Eat fresh foods. °· Eat more vegetables, fruits, and low-fat dairy products. °· Choose whole grains. Look for the word "whole" as the first word in the ingredient list. °· Choose fish and skinless chicken or turkey more often than red meat. Limit fish, poultry, and meat to 6 oz (170 g) each day. °· Limit sweets, desserts, sugars, and sugary drinks. °· Choose heart-healthy fats. °· Limit cheese to 1 oz (28 g) per day. °· Eat more home-cooked food and less restaurant, buffet, and fast food. °· Limit fried foods. °· Cook foods using methods other than frying. °· Limit canned vegetables. If you do use them, rinse them well to decrease the sodium. °· When eating at a restaurant, ask that your food be prepared with less salt, or no salt if possible. °WHAT FOODS CAN I EAT? °Seek help from a dietitian for individual calorie needs. °Grains °Whole grain or whole wheat bread. Brown rice. Whole grain or whole wheat pasta. Quinoa, bulgur, and whole grain cereals. Low-sodium cereals. Corn or whole wheat flour tortillas. Whole grain cornbread. Whole grain crackers. Low-sodium crackers. °Vegetables °Fresh or frozen vegetables  (raw, steamed, roasted, or grilled). Low-sodium or reduced-sodium tomato and vegetable juices. Low-sodium or reduced-sodium tomato sauce and paste. Low-sodium or reduced-sodium canned vegetables.  °Fruits °All fresh, canned (in natural juice), or frozen fruits. °Meat and Other Protein Products °Ground beef (85% or leaner), grass-fed beef, or beef trimmed of fat. Skinless chicken or turkey. Ground chicken or turkey. Pork trimmed of fat. All fish and seafood. Eggs. Dried beans, peas, or lentils. Unsalted nuts and seeds. Unsalted canned beans. °Dairy °Low-fat dairy products, such as skim or 1% milk, 2% or reduced-fat cheeses, low-fat ricotta or cottage cheese, or plain low-fat yogurt. Low-sodium or reduced-sodium cheeses. °Fats and Oils °Tub margarines without trans fats. Light or reduced-fat mayonnaise and salad dressings (reduced sodium). Avocado. Safflower, olive, or canola oils. Natural peanut or almond butter. °Other °Unsalted popcorn and pretzels. °The items listed above may not be a complete list of recommended foods or beverages. Contact your dietitian for more options. °WHAT FOODS ARE NOT RECOMMENDED? °Grains °White bread. White pasta. White rice. Refined cornbread. Bagels and croissants. Crackers that contain trans fat. °Vegetables °Creamed or fried vegetables. Vegetables in a cheese sauce. Regular canned vegetables. Regular canned tomato sauce and paste. Regular tomato and vegetable juices. °Fruits °Dried fruits. Canned fruit in light or heavy syrup. Fruit juice. °Meat and Other Protein Products °Fatty cuts of meat. Ribs, chicken wings, bacon, sausage, bologna, salami, chitterlings, fatback, hot dogs, bratwurst, and packaged luncheon meats. Salted nuts and seeds. Canned beans with salt. °Dairy °Whole or 2% milk, cream, half-and-half, and cream cheese. Whole-fat or sweetened yogurt. Full-fat   cheeses or blue cheese. Nondairy creamers and whipped toppings. Processed cheese, cheese spreads, or cheese  curds. °Condiments °Onion and garlic salt, seasoned salt, table salt, and sea salt. Canned and packaged gravies. Worcestershire sauce. Tartar sauce. Barbecue sauce. Teriyaki sauce. Soy sauce, including reduced sodium. Steak sauce. Fish sauce. Oyster sauce. Cocktail sauce. Horseradish. Ketchup and mustard. Meat flavorings and tenderizers. Bouillon cubes. Hot sauce. Tabasco sauce. Marinades. Taco seasonings. Relishes. °Fats and Oils °Butter, stick margarine, lard, shortening, ghee, and bacon fat. Coconut, palm kernel, or palm oils. Regular salad dressings. °Other °Pickles and olives. Salted popcorn and pretzels. °The items listed above may not be a complete list of foods and beverages to avoid. Contact your dietitian for more information. °WHERE CAN I FIND MORE INFORMATION? °National [employer], [employer], [employer] Institute: www.nhlbi.nih.gov/health/health-topics/topics/dash/ °Document Released: 11/15/2011 Document Revised: 04/12/2014 Document Reviewed: 09/30/2013 °[employer]® Patient Information ©2015 [employer]. This information is not intended to replace advice given to you by your health care provider. Make sure you discuss any questions you have with your health care provider. ° °

## 2015-04-12 NOTE — Progress Notes (Signed)
Patient presents for BP check after increasing lisinopril to 20 mg daily Med list reviewed; states taking all meds as directed, however, patient did not take any AM meds due to rushing Patient is not adding salt to foods or cooking with salt. Patient made aware of Brenda Clark as alternative to salt. Encouraged patient to choose foods with 5% or less of daily value for sodium. Discussed walking 30 minutes per day for exercise in 10 min increments up and down hallway of home Patient denies headaches, blurred vision, SHOB, chest pain  Filed Vitals:   04/12/15 0946  BP: 120/66  Pulse: 80  Temp: 98.1 F (36.7 C)  Resp: 16    Patient to return in 2 weeks for nurse visit for BP check Instructed patient to take AM doses of BP meds prior to next visit Patient denies any episodes of  dizziness, lightheadedness or feeling like passing out  Patient advised to call for med refills at least 7 days before running out so as not to go without.  Patient given literature on DASH Eating Plan

## 2015-04-13 DIAGNOSIS — E119 Type 2 diabetes mellitus without complications: Secondary | ICD-10-CM | POA: Diagnosis not present

## 2015-04-14 DIAGNOSIS — E119 Type 2 diabetes mellitus without complications: Secondary | ICD-10-CM | POA: Diagnosis not present

## 2015-04-15 DIAGNOSIS — E119 Type 2 diabetes mellitus without complications: Secondary | ICD-10-CM | POA: Diagnosis not present

## 2015-04-16 DIAGNOSIS — E119 Type 2 diabetes mellitus without complications: Secondary | ICD-10-CM | POA: Diagnosis not present

## 2015-04-17 DIAGNOSIS — E119 Type 2 diabetes mellitus without complications: Secondary | ICD-10-CM | POA: Diagnosis not present

## 2015-04-18 DIAGNOSIS — E119 Type 2 diabetes mellitus without complications: Secondary | ICD-10-CM | POA: Diagnosis not present

## 2015-04-19 DIAGNOSIS — E119 Type 2 diabetes mellitus without complications: Secondary | ICD-10-CM | POA: Diagnosis not present

## 2015-04-20 DIAGNOSIS — E119 Type 2 diabetes mellitus without complications: Secondary | ICD-10-CM | POA: Diagnosis not present

## 2015-04-21 DIAGNOSIS — E119 Type 2 diabetes mellitus without complications: Secondary | ICD-10-CM | POA: Diagnosis not present

## 2015-04-22 DIAGNOSIS — E119 Type 2 diabetes mellitus without complications: Secondary | ICD-10-CM | POA: Diagnosis not present

## 2015-04-23 DIAGNOSIS — E119 Type 2 diabetes mellitus without complications: Secondary | ICD-10-CM | POA: Diagnosis not present

## 2015-04-24 DIAGNOSIS — E119 Type 2 diabetes mellitus without complications: Secondary | ICD-10-CM | POA: Diagnosis not present

## 2015-04-25 DIAGNOSIS — E119 Type 2 diabetes mellitus without complications: Secondary | ICD-10-CM | POA: Diagnosis not present

## 2015-04-26 DIAGNOSIS — E119 Type 2 diabetes mellitus without complications: Secondary | ICD-10-CM | POA: Diagnosis not present

## 2015-04-27 DIAGNOSIS — E119 Type 2 diabetes mellitus without complications: Secondary | ICD-10-CM | POA: Diagnosis not present

## 2015-04-28 DIAGNOSIS — E119 Type 2 diabetes mellitus without complications: Secondary | ICD-10-CM | POA: Diagnosis not present

## 2015-04-29 ENCOUNTER — Ambulatory Visit: Payer: Medicare Other | Attending: Internal Medicine | Admitting: *Deleted

## 2015-04-29 VITALS — BP 123/77 | HR 81 | Temp 98.1°F | Resp 20

## 2015-04-29 DIAGNOSIS — I10 Essential (primary) hypertension: Secondary | ICD-10-CM

## 2015-04-29 DIAGNOSIS — E119 Type 2 diabetes mellitus without complications: Secondary | ICD-10-CM | POA: Diagnosis not present

## 2015-04-29 NOTE — Progress Notes (Signed)
Patient presents for BP check after taking AM BP meds. Did not take lasix this AM due to immediate need to void Med list reviewed; states taking all meds as directed Trying to follow low sodium diet Walking 10 minutes per day for exercise;  Patient denies headaches, blurred vision, SHOB, chest pain or pressure Denies dizziness, lightheadedness  Filed Vitals:   04/29/15 0914  BP: 123/77  Pulse: 81  Temp: 98.1 F (36.7 C)  Resp: 20     Patient advised to call for med refills at least 7 days before running out so as not to go without.  Patient aware that she is to f/u with PCP 3 months from last visit (Due 06/27/15)

## 2015-04-30 DIAGNOSIS — E119 Type 2 diabetes mellitus without complications: Secondary | ICD-10-CM | POA: Diagnosis not present

## 2015-05-01 DIAGNOSIS — E119 Type 2 diabetes mellitus without complications: Secondary | ICD-10-CM | POA: Diagnosis not present

## 2015-05-02 DIAGNOSIS — E119 Type 2 diabetes mellitus without complications: Secondary | ICD-10-CM | POA: Diagnosis not present

## 2015-05-03 DIAGNOSIS — E119 Type 2 diabetes mellitus without complications: Secondary | ICD-10-CM | POA: Diagnosis not present

## 2015-05-04 DIAGNOSIS — E119 Type 2 diabetes mellitus without complications: Secondary | ICD-10-CM | POA: Diagnosis not present

## 2015-05-05 DIAGNOSIS — E119 Type 2 diabetes mellitus without complications: Secondary | ICD-10-CM | POA: Diagnosis not present

## 2015-05-06 ENCOUNTER — Other Ambulatory Visit: Payer: Self-pay | Admitting: *Deleted

## 2015-05-06 DIAGNOSIS — E119 Type 2 diabetes mellitus without complications: Secondary | ICD-10-CM | POA: Diagnosis not present

## 2015-05-06 MED ORDER — GLUCOSE BLOOD VI STRP: 1.0000 | ORAL_STRIP | Status: DC | PRN

## 2015-05-07 DIAGNOSIS — E119 Type 2 diabetes mellitus without complications: Secondary | ICD-10-CM | POA: Diagnosis not present

## 2015-05-08 DIAGNOSIS — E119 Type 2 diabetes mellitus without complications: Secondary | ICD-10-CM | POA: Diagnosis not present

## 2015-05-09 DIAGNOSIS — E119 Type 2 diabetes mellitus without complications: Secondary | ICD-10-CM | POA: Diagnosis not present

## 2015-05-10 DIAGNOSIS — E119 Type 2 diabetes mellitus without complications: Secondary | ICD-10-CM | POA: Diagnosis not present

## 2015-05-11 DIAGNOSIS — E119 Type 2 diabetes mellitus without complications: Secondary | ICD-10-CM | POA: Diagnosis not present

## 2015-05-12 DIAGNOSIS — E119 Type 2 diabetes mellitus without complications: Secondary | ICD-10-CM | POA: Diagnosis not present

## 2015-05-13 DIAGNOSIS — E119 Type 2 diabetes mellitus without complications: Secondary | ICD-10-CM | POA: Diagnosis not present

## 2015-05-14 DIAGNOSIS — E119 Type 2 diabetes mellitus without complications: Secondary | ICD-10-CM | POA: Diagnosis not present

## 2015-05-15 DIAGNOSIS — E119 Type 2 diabetes mellitus without complications: Secondary | ICD-10-CM | POA: Diagnosis not present

## 2015-05-16 DIAGNOSIS — E119 Type 2 diabetes mellitus without complications: Secondary | ICD-10-CM | POA: Diagnosis not present

## 2015-05-17 DIAGNOSIS — E119 Type 2 diabetes mellitus without complications: Secondary | ICD-10-CM | POA: Diagnosis not present

## 2015-05-18 DIAGNOSIS — E119 Type 2 diabetes mellitus without complications: Secondary | ICD-10-CM | POA: Diagnosis not present

## 2015-05-19 DIAGNOSIS — E119 Type 2 diabetes mellitus without complications: Secondary | ICD-10-CM | POA: Diagnosis not present

## 2015-05-20 DIAGNOSIS — E119 Type 2 diabetes mellitus without complications: Secondary | ICD-10-CM | POA: Diagnosis not present

## 2015-05-21 DIAGNOSIS — E119 Type 2 diabetes mellitus without complications: Secondary | ICD-10-CM | POA: Diagnosis not present

## 2015-05-22 DIAGNOSIS — E119 Type 2 diabetes mellitus without complications: Secondary | ICD-10-CM | POA: Diagnosis not present

## 2015-05-23 DIAGNOSIS — E119 Type 2 diabetes mellitus without complications: Secondary | ICD-10-CM | POA: Diagnosis not present

## 2015-05-24 DIAGNOSIS — E119 Type 2 diabetes mellitus without complications: Secondary | ICD-10-CM | POA: Diagnosis not present

## 2015-05-25 ENCOUNTER — Other Ambulatory Visit: Payer: Self-pay | Admitting: Internal Medicine

## 2015-05-25 DIAGNOSIS — E119 Type 2 diabetes mellitus without complications: Secondary | ICD-10-CM | POA: Diagnosis not present

## 2015-05-26 DIAGNOSIS — E119 Type 2 diabetes mellitus without complications: Secondary | ICD-10-CM | POA: Diagnosis not present

## 2015-05-27 DIAGNOSIS — E119 Type 2 diabetes mellitus without complications: Secondary | ICD-10-CM | POA: Diagnosis not present

## 2015-05-30 ENCOUNTER — Telehealth: Payer: Self-pay | Admitting: Internal Medicine

## 2015-05-30 NOTE — Telephone Encounter (Signed)
Tye from [facility] pharmacy called requesting status of test strip refill request.  Please send refill request to (Fax [phone])

## 2015-06-08 NOTE — Telephone Encounter (Signed)
Test strips were ordered in may with eleven refills

## 2015-08-30 ENCOUNTER — Other Ambulatory Visit: Payer: Self-pay | Admitting: Internal Medicine

## 2015-08-30 ENCOUNTER — Ambulatory Visit: Payer: Self-pay | Admitting: Family Medicine

## 2015-08-30 DIAGNOSIS — Z1231 Encounter for screening mammogram for malignant neoplasm of breast: Secondary | ICD-10-CM

## 2015-09-02 ENCOUNTER — Ambulatory Visit: Payer: Medicare Other | Attending: Family Medicine | Admitting: Family Medicine

## 2015-09-02 ENCOUNTER — Encounter: Payer: Self-pay | Admitting: Family Medicine

## 2015-09-02 VITALS — BP 134/90 | Ht 64.0 in | Wt 388.0 lb

## 2015-09-02 DIAGNOSIS — M1710 Unilateral primary osteoarthritis, unspecified knee: Secondary | ICD-10-CM

## 2015-09-02 DIAGNOSIS — Z7901 Long term (current) use of anticoagulants: Secondary | ICD-10-CM | POA: Insufficient documentation

## 2015-09-02 DIAGNOSIS — IMO0002 Reserved for concepts with insufficient information to code with codable children: Secondary | ICD-10-CM

## 2015-09-02 DIAGNOSIS — M179 Osteoarthritis of knee, unspecified: Secondary | ICD-10-CM | POA: Diagnosis not present

## 2015-09-02 DIAGNOSIS — E1310 Other specified diabetes mellitus with ketoacidosis without coma: Secondary | ICD-10-CM | POA: Diagnosis not present

## 2015-09-02 DIAGNOSIS — Z23 Encounter for immunization: Secondary | ICD-10-CM | POA: Diagnosis not present

## 2015-09-02 DIAGNOSIS — Z5181 Encounter for therapeutic drug level monitoring: Secondary | ICD-10-CM

## 2015-09-02 DIAGNOSIS — I2699 Other pulmonary embolism without acute cor pulmonale: Secondary | ICD-10-CM | POA: Diagnosis not present

## 2015-09-02 DIAGNOSIS — E1110 Type 2 diabetes mellitus with ketoacidosis without coma: Secondary | ICD-10-CM

## 2015-09-02 LAB — POCT INR: INR: 2.1

## 2015-09-02 LAB — GLUCOSE, POCT (MANUAL RESULT ENTRY): POC Glucose: 133 mg/dl — AB (ref 70–99)

## 2015-09-02 LAB — POCT GLYCOSYLATED HEMOGLOBIN (HGB A1C): HEMOGLOBIN A1C: 6.1

## 2015-09-02 MED ORDER — TRAMADOL HCL 50 MG PO TABS: 50.0000 mg | ORAL_TABLET | Freq: Two times a day (BID) | ORAL | Status: DC | PRN

## 2015-09-02 NOTE — Patient Instructions (Signed)
It was a pleasure to see you today.   Congratulations on the diabetes control!  Continue current medication and diet/exercise regimen.   For the arthritis pain, I am prescribing Tramadol , take 1 tablet by mouth every 12 hours as needed for severe pain.  Otherwise, continue using the acetaminophen.   After careful review of the records, it seems that Dr Jens Som had recommended you continue on warfarin for life to prevent more clots.  Please continue the warfarin at your current dose and come back for another INR check in 1 month.   [facility] SCAT form completed and returned this visit.

## 2015-09-02 NOTE — Assessment & Plan Note (Signed)
Well controlled, continue current therapy. 

## 2015-09-02 NOTE — Progress Notes (Signed)
   Subjective:    Patient ID: Brenda Clark, female    DOB: [birth year], 64 y.o.   MRN: [id number]  HPI c/o shoulder and knee arthritis pain, taking 12 tylenols a day and not helping. Heat does not help the pain.  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 No clear reason according to patient.  Was not post=op or any other clear reason.     Review of cardiology notes from May 2012 and 2013 shows recommendation for lifelong warfarin according to Dr Jens Som, concern that her immobility and morbid obesity predisposes to more VTEs.   Denies chest pain or shortness of breath     Review of Systems     Objective:   Physical Exam Well appearing, no distress Neck supple.  COR Regular S1S2 PULM Clear bilat EXTS no active synovitis, redness or warmth in knees or ankles.        Assessment & Plan:

## 2015-09-02 NOTE — Addendum Note (Signed)
Addended by: Lestine Mount on: 09/02/2015 09:59 AM   Modules accepted: Orders

## 2015-09-02 NOTE — Progress Notes (Signed)
Patient requesting SCAT form to be filled out by doctor. Patient CBG 133 INR: 2.1  Patient complains of pain in shoulders and knees, scaled at level 9, described as an aching pain. Pain present for >3 months.  Take 3 Tylenol at a time total of 9-12 tablets. Patient has not taken meds this morning.

## 2015-09-02 NOTE — Assessment & Plan Note (Signed)
Patient s/p PE in May 2012, unexplained etiology.  Dr Jens Som recommended lifelong warfarin, controlled today. To continue current dose, return for INR check at [facility].

## 2015-09-03 LAB — MICROALBUMIN / CREATININE URINE RATIO
CREATININE, URINE: 162.5 mg/dL
Microalb Creat Ratio: 9.8 mg/g (ref 0.0–30.0)
Microalb, Ur: 1.6 mg/dL (ref <2.0)

## 2015-09-16 ENCOUNTER — Ambulatory Visit: Payer: Self-pay

## 2015-11-08 ENCOUNTER — Telehealth: Payer: Self-pay | Admitting: *Deleted

## 2015-11-08 NOTE — Telephone Encounter (Signed)
Error

## 2016-01-05 ENCOUNTER — Ambulatory Visit: Payer: Self-pay | Admitting: Family Medicine

## 2016-01-19 ENCOUNTER — Ambulatory Visit: Payer: Medicare Other | Attending: Internal Medicine | Admitting: Internal Medicine

## 2016-01-19 ENCOUNTER — Encounter: Payer: Self-pay | Admitting: Internal Medicine

## 2016-01-19 VITALS — BP 191/71 | HR 96 | Temp 98.0°F | Resp 16 | Ht 64.0 in | Wt 395.0 lb

## 2016-01-19 DIAGNOSIS — G4733 Obstructive sleep apnea (adult) (pediatric): Secondary | ICD-10-CM | POA: Diagnosis not present

## 2016-01-19 DIAGNOSIS — E785 Hyperlipidemia, unspecified: Secondary | ICD-10-CM | POA: Diagnosis not present

## 2016-01-19 DIAGNOSIS — D649 Anemia, unspecified: Secondary | ICD-10-CM

## 2016-01-19 DIAGNOSIS — G8929 Other chronic pain: Secondary | ICD-10-CM | POA: Diagnosis not present

## 2016-01-19 DIAGNOSIS — Z7901 Long term (current) use of anticoagulants: Secondary | ICD-10-CM

## 2016-01-19 DIAGNOSIS — I10 Essential (primary) hypertension: Secondary | ICD-10-CM | POA: Diagnosis not present

## 2016-01-19 DIAGNOSIS — Z7984 Long term (current) use of oral hypoglycemic drugs: Secondary | ICD-10-CM | POA: Diagnosis not present

## 2016-01-19 DIAGNOSIS — M1990 Unspecified osteoarthritis, unspecified site: Secondary | ICD-10-CM | POA: Diagnosis not present

## 2016-01-19 DIAGNOSIS — E119 Type 2 diabetes mellitus without complications: Secondary | ICD-10-CM

## 2016-01-19 DIAGNOSIS — K449 Diaphragmatic hernia without obstruction or gangrene: Secondary | ICD-10-CM | POA: Insufficient documentation

## 2016-01-19 DIAGNOSIS — Z5181 Encounter for therapeutic drug level monitoring: Secondary | ICD-10-CM | POA: Diagnosis not present

## 2016-01-19 DIAGNOSIS — K219 Gastro-esophageal reflux disease without esophagitis: Secondary | ICD-10-CM | POA: Diagnosis not present

## 2016-01-19 DIAGNOSIS — Z86711 Personal history of pulmonary embolism: Secondary | ICD-10-CM | POA: Diagnosis not present

## 2016-01-19 DIAGNOSIS — Z79899 Other long term (current) drug therapy: Secondary | ICD-10-CM | POA: Diagnosis not present

## 2016-01-19 LAB — GLUCOSE, POCT (MANUAL RESULT ENTRY): POC Glucose: 118 mg/dl — AB (ref 70–99)

## 2016-01-19 LAB — CBC WITH DIFFERENTIAL/PLATELET
Basophils Absolute: 0 10*3/uL (ref 0.0–0.1)
Basophils Relative: 0 % (ref 0–1)
Eosinophils Absolute: 0 10*3/uL (ref 0.0–0.7)
Eosinophils Relative: 1 % (ref 0–5)
HEMATOCRIT: 27.8 % — AB (ref 36.0–46.0)
HEMOGLOBIN: 7.9 g/dL — AB (ref 12.0–15.0)
LYMPHS PCT: 26 % (ref 12–46)
Lymphs Abs: 1.2 10*3/uL (ref 0.7–4.0)
MCH: 19.7 pg — AB (ref 26.0–34.0)
MCHC: 28.4 g/dL — ABNORMAL LOW (ref 30.0–36.0)
MCV: 69.3 fL — ABNORMAL LOW (ref 78.0–100.0)
MONOS PCT: 9 % (ref 3–12)
MPV: 9.8 fL (ref 8.6–12.4)
Monocytes Absolute: 0.4 10*3/uL (ref 0.1–1.0)
NEUTROS ABS: 2.9 10*3/uL (ref 1.7–7.7)
Neutrophils Relative %: 64 % (ref 43–77)
Platelets: 329 10*3/uL (ref 150–400)
RBC: 4.01 MIL/uL (ref 3.87–5.11)
RDW: 17.7 % — AB (ref 11.5–15.5)
WBC: 4.6 10*3/uL (ref 4.0–10.5)

## 2016-01-19 LAB — COMPLETE METABOLIC PANEL WITH GFR
ALBUMIN: 3.6 g/dL (ref 3.6–5.1)
ALK PHOS: 83 U/L (ref 33–130)
ALT: 8 U/L (ref 6–29)
AST: 12 U/L (ref 10–35)
BUN: 9 mg/dL (ref 7–25)
CO2: 25 mmol/L (ref 20–31)
Calcium: 9 mg/dL (ref 8.6–10.4)
Chloride: 106 mmol/L (ref 98–110)
Creat: 0.61 mg/dL (ref 0.50–0.99)
Glucose, Bld: 101 mg/dL — ABNORMAL HIGH (ref 65–99)
Potassium: 4.1 mmol/L (ref 3.5–5.3)
Sodium: 140 mmol/L (ref 135–146)
Total Bilirubin: 0.3 mg/dL (ref 0.2–1.2)
Total Protein: 7 g/dL (ref 6.1–8.1)

## 2016-01-19 LAB — POCT INR: INR: 1.1

## 2016-01-19 LAB — POCT GLYCOSYLATED HEMOGLOBIN (HGB A1C): Hemoglobin A1C: 5.9

## 2016-01-19 MED ORDER — METFORMIN HCL ER 500 MG PO TB24: 500.0000 mg | ORAL_TABLET | Freq: Every day | ORAL | Status: DC

## 2016-01-19 MED ORDER — LISINOPRIL 20 MG PO TABS: 20.0000 mg | ORAL_TABLET | Freq: Every day | ORAL | Status: DC

## 2016-01-19 MED ORDER — ATORVASTATIN CALCIUM 20 MG PO TABS: 20.0000 mg | ORAL_TABLET | Freq: Every day | ORAL | Status: DC

## 2016-01-19 MED ORDER — AMLODIPINE BESYLATE 10 MG PO TABS: 10.0000 mg | ORAL_TABLET | Freq: Every day | ORAL | Status: DC

## 2016-01-19 MED ORDER — RIVAROXABAN 20 MG PO TABS: 20.0000 mg | ORAL_TABLET | Freq: Every day | ORAL | Status: DC

## 2016-01-19 NOTE — Progress Notes (Signed)
Patient here for follow up on her diabetes and HTN Patient has not been taking any of her medications for over a month Patient stated she did not have the funds to pick them up Complains of having a headache for over a month and decreased energy

## 2016-01-19 NOTE — Progress Notes (Signed)
Patient ID: Brenda Clark, female   DOB: 1951/04/08, 65 y.o.   MRN: [id number]  CC: establish care, new PCP  HPI: Brenda Clark is a 65 y.o. female here today for a follow up visit.  Patient has past medical history of anemia, HTN, morbid obesity, PE (3 yrs ago), arthritis, and diabetes in obesity. Patient states that she has been off all of her medications for the past 1 month due to financial reasons. Since being off her medications she has been having complaints of frequent headaches described as a pressure sensation. She has not checked her blood pressure at home.   No Known Allergies Past Medical History  Diagnosis Date  . Iron deficiency anemia, unspecified     ice pica  . Other and unspecified hyperlipidemia   . Unspecified essential hypertension   . Retinal detachment   . Migraine, unspecified, without mention of intractable migraine without mention of status migrainosus   . Type II or unspecified type diabetes mellitus without mention of complication, not stated as uncontrolled   . Osteoarthrosis, unspecified whether generalized or localized, lower leg   . Morbid obesity ([place])   . Pulmonary embolus ([place]) 2012  . GERD (gastroesophageal reflux disease)   . Headache, migraine   . Chronic pain   . OSA (obstructive sleep apnea)   . Hyperlipidemia   . Arthritis     DDD and HNP on MRI in 2008  . Endometrial polyp 08/2010    biopsied.   . Hiatal hernia 03/05/2014  . 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 ACCU-CHEK SOFTCLIX LANCETS lancets 1 each as needed. Reported on 01/19/2016    . acetaminophen (TYLENOL) 650 MG CR tablet Take 1,300 mg by mouth every 8 (eight) hours as needed for pain. Reported on 01/19/2016    . amLODipine (NORVASC) 10 MG tablet Take 1 tablet (10 mg total) by mouth daily. (Patient not taking: Reported on 01/19/2016) 30 tablet 6  . atorvastatin (LIPITOR) 20 MG tablet Take 1 tablet (20 mg total)  by mouth daily. (Patient not taking: Reported on 01/19/2016) 90 tablet 3  . cyclobenzaprine (FLEXERIL) 10 MG tablet Take 1 tablet (10 mg total) by mouth 3 (three) times daily as needed for muscle spasms. (Patient not taking: Reported on 01/19/2016) 15 tablet 0  . diphenhydramine-acetaminophen (TYLENOL PM) 25-500 MG TABS Take 1 tablet by mouth at bedtime as needed (for sleep). Reported on 01/19/2016    . ferrous sulfate 325 (65 FE) MG tablet Take 1 tablet (325 mg total) by mouth 3 (three) times daily with meals. (Patient not taking: Reported on 01/19/2016) 90 tablet 0  . furosemide (LASIX) 20 MG tablet Take 1 tablet (20 mg total) by mouth daily. (Patient not taking: Reported on 01/19/2016) 30 tablet 3  . glucose blood (ACCU-CHEK AVIVA PLUS) test strip 1 each by Other route as needed. (Patient not taking: Reported on 01/19/2016) 100 each 11  . glucose monitoring kit (FREESTYLE) monitoring kit 1 each by Does not apply route as needed for other. Dispense any model that is covered- dispense testing supplies for Q AC/ HS accuchecks- 1 month supply with 6 refills. (Patient not taking: Reported on 01/19/2016) 1 each 1  . HYDROcodone-acetaminophen (NORCO/VICODIN) 5-325 MG per tablet Take 1 tablet by mouth every 6 (six) hours as needed. (Patient not taking: Reported on 09/02/2015) 20 tablet 0  . lisinopril (PRINIVIL,ZESTRIL) 20 MG tablet Take 1 tablet (20 mg total) by  mouth daily. (Patient not taking: Reported on 01/19/2016) 30 tablet 3  . metFORMIN (GLUCOPHAGE-XR) 500 MG 24 hr tablet Take 1 tablet (500 mg total) by mouth daily with breakfast. (Patient not taking: Reported on 01/19/2016) 60 tablet 6  . nitroGLYCERIN (NITROSTAT) 0.4 MG SL tablet Place 0.4 mg under the tongue every 5 (five) minutes as needed. Reported on 01/19/2016    . omeprazole (PRILOSEC) 20 MG capsule Take 2 capsules (40 mg total) by mouth daily. (Patient not taking: Reported on 01/19/2016) 30 capsule 6  . potassium chloride SA (KLOR-CON M20) 20 MEQ tablet Take 1  tablet (20 mEq total) by mouth daily. 30 tablet 3  . traMADol (ULTRAM) 50 MG tablet Take 1 tablet (50 mg total) by mouth every 12 (twelve) hours as needed. (Patient not taking: Reported on 01/19/2016) 30 tablet 0  . warfarin (COUMADIN) 7.5 MG tablet Take 1 tablet (7.5 mg total) by mouth See admin instructions. Take 7.5 mg daily and go to the coumadin clinic on 3/30 for further dose adjustment. (Patient not taking: Reported on 01/19/2016)     No current facility-administered medications on file prior to visit.   Family History  Problem Relation Age of Onset  . Colon cancer Father   . Breast cancer Sister   . Diabetes Sister    Social History   Social History  . Marital Status: Divorced    Spouse Name: N/A  . Number of Children: 2  . Years of Education: HS   Occupational History  . DISABLED     since 2007   Social History Main Topics  . Smoking status: Never Smoker   . Smokeless tobacco: Never Used  . Alcohol Use: No  . Drug Use: No  . Sexual Activity: Not on file   Other Topics Concern  . Not on file   Social History Narrative   Patient with obesity, hyperventilation and obstructive apneas, successfully treated with CPAP at 10 cm water. Good residual AHI values for the last year and low Epworth score. Compliance is good. [facility] follows her.    Her GERD is better on omeprazole and this has further reduced her nocturnal awakenings. She is still hypertensive and needs urgently to lose weight, this would positivley affect her diabetes as well.       I convinced her to take her Topamax and take her Lasix in the AM at 20 mg. She was advised of low carb diet and potassium rich foods.       Patient lives at home alone.   Caffeine Use: occasionally    Review of Systems  Constitutional: Positive for malaise/fatigue.  Respiratory: Positive for shortness of breath (with exertion).   Cardiovascular: Positive for leg swelling. Negative for chest pain, palpitations and claudication.   Neurological: Positive for headaches. Negative for dizziness.  All other systems reviewed and are negative.  Objective:   Filed Vitals:   01/19/16 1137  BP: 191/71  Pulse: 96  Temp: 98 F (36.7 C)  Resp: 16    Physical Exam  Constitutional: She is oriented to person, place, and time.   Severe obesity  Cardiovascular: Normal rate and regular rhythm.   Pulmonary/Chest: Effort normal and breath sounds normal. She has no wheezes.  Musculoskeletal: She exhibits edema ( lower exttremities).  Neurological: She is alert and oriented to person, place, and time.  Skin: Skin is warm and dry.   Diabetic Foot Exam - Simple   Simple Foot Form  Visual Inspection  No deformities, no  ulcerations, no other skin breakdown bilaterally:  Yes  Sensation Testing  Intact to touch and monofilament testing bilaterally:  Yes  Pulse Check  Posterior Tibialis and Dorsalis pulse intact bilaterally:  Yes  Comments       Lab Results  Component Value Date   WBC 4.1 07/22/2014   HGB 12.6 07/22/2014   HCT 37.7 07/22/2014   MCV 78.7 07/22/2014   PLT 244 07/22/2014   Lab Results  Component Value Date   CREATININE 0.53 01/17/2015   BUN 11 01/17/2015   NA 139 01/17/2015   K 4.7 01/17/2015   CL 104 01/17/2015   CO2 25 01/17/2015    Lab Results  Component Value Date   HGBA1C 5.90 01/19/2016   Lipid Panel     Component Value Date/Time   CHOL 182 01/17/2015 0940   TRIG 80 01/17/2015 0940   HDL 60 01/17/2015 0940   CHOLHDL 3.0 01/17/2015 0940   VLDL 16 01/17/2015 0940   LDLCALC 106* 01/17/2015 0940       Assessment and plan:   Brenda Clark was seen today for follow-up.  Diagnoses and all orders for this visit:  Type 2 diabetes mellitus without complication, without long-term current use of insulin (HCC) -     Glucose (CBG) -     HgB A1c -     Microalbumin, urine -     metFORMIN (GLUCOPHAGE-XR) 500 MG 24 hr tablet; Take 1 tablet (500 mg total) by mouth daily with breakfast.  Although  patient has been off metformin for her one-month her A1c is 5.9.  I will place patient back on metformin to hopefully assist with some weight loss  weight loss highly advised the patient's an area. Explained that she may be able to come off metformin with weight loss.  Essential hypertension -     amLODipine (NORVASC) 10 MG tablet; Take 1 tablet (10 mg total) by mouth daily. -     lisinopril (PRINIVIL,ZESTRIL) 20 MG tablet; Take 1 tablet (20 mg total) by mouth daily. -     COMPLETE METABOLIC PANEL WITH GFR  Blood pressure severely elevated in office due to medical noncompliance.  I have stressed long-term, occasions of uncontrolled hypertension. Weight loss and DASH diet advised.   Hyperlipidemia -     Refilled atorvastatin (LIPITOR) 20 MG tablet; Take 1 tablet (20 mg total) by mouth daily. Education provided on proper lifestyle changes in order to lower cholesterol. Patient advised to maintain healthy weight and to keep total fat intake at 25-35% of total calories and carbohydrates 50-60% of total daily calories. Explained how high cholesterol places patient at risk for heart disease. Patient placed on appropriate medication and repeat labs in 6 months   Encounter for monitoring coumadin therapy -     INR  History of pulmonary embolism -     rivaroxaban (XARELTO) 20 MG TABS tablet; Take 1 tablet (20 mg total) by mouth daily with supper.  due to patient's noncompliance with INR visits I would DC Coumadin and switch her over to Xarelto.  I will keep patient on long-term anticoagulation due to hr size and high risk for repeat events.  Anemia, unspecified anemia type -     CBC with Differential  patient has been off ferrous sulfate for several months will recheck today.   Morbid obesity, unspecified obesity type ([place]) Weight loss discussed at length and its complications to health.  Patient will loss 10 months by next visit in 3 months.  Diet and exercise  discussed as well as calorie  intake.   Return in about 3 months (around 04/17/2016) for DM/HTN.       Lance Bosch, [facility] [phone] 01/19/2016, 11:40 AM

## 2016-01-20 LAB — MICROALBUMIN, URINE: Microalb, Ur: 1.1 mg/dL

## 2016-01-23 ENCOUNTER — Other Ambulatory Visit: Payer: Self-pay | Admitting: Internal Medicine

## 2016-01-23 ENCOUNTER — Telehealth: Payer: Self-pay

## 2016-01-23 DIAGNOSIS — D509 Iron deficiency anemia, unspecified: Secondary | ICD-10-CM

## 2016-01-23 MED ORDER — FERROUS SULFATE 325 (65 FE) MG PO TABS
325.0000 mg | ORAL_TABLET | Freq: Two times a day (BID) | ORAL | Status: DC
Start: 2016-01-23 — End: 2016-02-08

## 2016-01-23 NOTE — Telephone Encounter (Signed)
Spoke with patient and she is aware of her lab results Has not had any blood in her stools Patient stated her last colonoscopy was last year

## 2016-01-23 NOTE — Telephone Encounter (Signed)
-----   Message from Ambrose Finland, NP sent at 01/23/2016  8:59 AM EST ----- Patient is severely anemic, please find out of patient has been losing any blood through her stools. This patient up-to-date on her colonoscopy?  I will send patient iron supplements to take twice daily with meals. She will need to get an over-the-counter stool softener to prevent constipation

## 2016-01-30 ENCOUNTER — Telehealth: Payer: Self-pay | Admitting: Internal Medicine

## 2016-01-30 NOTE — Telephone Encounter (Signed)
Patient called about her medication assistance program.. Please follow up.

## 2016-02-08 ENCOUNTER — Other Ambulatory Visit: Payer: Self-pay | Admitting: Internal Medicine

## 2016-02-08 DIAGNOSIS — Z86711 Personal history of pulmonary embolism: Secondary | ICD-10-CM

## 2016-02-08 DIAGNOSIS — E785 Hyperlipidemia, unspecified: Secondary | ICD-10-CM

## 2016-02-08 DIAGNOSIS — D509 Iron deficiency anemia, unspecified: Secondary | ICD-10-CM

## 2016-02-08 DIAGNOSIS — I10 Essential (primary) hypertension: Secondary | ICD-10-CM

## 2016-02-08 DIAGNOSIS — E119 Type 2 diabetes mellitus without complications: Secondary | ICD-10-CM

## 2016-02-08 MED ORDER — LISINOPRIL 20 MG PO TABS: 20.0000 mg | ORAL_TABLET | Freq: Every day | ORAL | Status: DC

## 2016-02-08 MED ORDER — METFORMIN HCL ER 500 MG PO TB24: 500.0000 mg | ORAL_TABLET | Freq: Every day | ORAL | Status: DC

## 2016-02-08 MED ORDER — FERROUS SULFATE 325 (65 FE) MG PO TABS
325.0000 mg | ORAL_TABLET | Freq: Two times a day (BID) | ORAL | Status: DC
Start: 2016-02-08 — End: 2016-10-23

## 2016-02-08 MED ORDER — RIVAROXABAN 20 MG PO TABS: 20.0000 mg | ORAL_TABLET | Freq: Every day | ORAL | Status: DC

## 2016-02-08 MED ORDER — ATORVASTATIN CALCIUM 20 MG PO TABS: 20.0000 mg | ORAL_TABLET | Freq: Every day | ORAL | Status: DC

## 2016-02-08 MED ORDER — AMLODIPINE BESYLATE 10 MG PO TABS: 10.0000 mg | ORAL_TABLET | Freq: Every day | ORAL | Status: DC

## 2016-02-08 MED FILL — AMLODIPINE BESYLATE 10 MG T: 10 | 90 days supply | Qty: 90 | Fill #0

## 2016-02-08 MED FILL — FERROUS SULFATE 325 MG TAB: 325 (65 FE) | 45 days supply | Qty: 90 | Fill #0

## 2016-02-08 MED FILL — LISINOPRIL 20 MG TABLET: 20 | 90 days supply | Qty: 90 | Fill #0

## 2016-02-08 MED FILL — ATORVASTATIN 20 MG TABLET: 20 | 90 days supply | Qty: 90 | Fill #0

## 2016-02-08 MED FILL — METFORMIN HCL ER 500 MG TAB: 500 | 90 days supply | Qty: 90 | Fill #0

## 2016-02-17 ENCOUNTER — Encounter: Payer: Self-pay | Admitting: Clinical

## 2016-02-17 ENCOUNTER — Other Ambulatory Visit: Payer: Self-pay | Admitting: Internal Medicine

## 2016-02-17 ENCOUNTER — Telehealth: Payer: Self-pay

## 2016-02-17 ENCOUNTER — Telehealth: Payer: Self-pay | Admitting: Internal Medicine

## 2016-02-17 MED FILL — OMEPRAZOLE DR 20 MG CAPSULE: 20 | 30 days supply | Qty: 60 | Fill #0

## 2016-02-17 NOTE — Telephone Encounter (Signed)
Returned phone call to patient  Patient stated when she picked up her medications She was missing her omeprazole Pharmacy received the script and will refill it

## 2016-02-17 NOTE — Progress Notes (Signed)
[facility] 01/19/2016 09/02/2015  Decreased Interest 2 0  Down, Depressed, Hopeless 1 0  PHQ - 2 Score 3 0  Altered sleeping 2 -  Tired, decreased energy 3 -  Change in appetite 2 -  Feeling bad or failure about yourself  0 -  Trouble concentrating 1 -  Moving slowly or fidgety/restless 2 -  Suicidal thoughts 0 -  PHQ-9 Score 13 -    GAD 7 : Generalized Anxiety Score 01/19/2016  Nervous, Anxious, on Edge 1  Control/stop worrying 0  Worry too much - different things 0  Trouble relaxing 0  Restless 0  Easily annoyed or irritable 1

## 2016-02-17 NOTE — Telephone Encounter (Signed)
Pt called stating that her medication: omeprazole (PRILOSEC) 20 MG capsule [[id number] was not sent to the pharmacy Please assist

## 2016-03-06 ENCOUNTER — Other Ambulatory Visit: Payer: Self-pay | Admitting: Internal Medicine

## 2016-06-29 ENCOUNTER — Ambulatory Visit: Payer: Medicare Other | Admitting: Podiatry

## 2016-07-04 MED FILL — OMEPRAZOLE DR 20 MG CAPSULE: 20 | 30 days supply | Qty: 60 | Fill #1

## 2016-07-20 ENCOUNTER — Ambulatory Visit: Payer: Medicare Other | Admitting: Podiatry

## 2016-10-23 ENCOUNTER — Encounter: Payer: Self-pay | Admitting: Family Medicine

## 2016-10-23 ENCOUNTER — Ambulatory Visit: Payer: Medicare Other | Attending: Family Medicine | Admitting: Family Medicine

## 2016-10-23 VITALS — BP 158/88 | HR 100 | Temp 98.0°F | Ht 64.0 in | Wt 384.0 lb

## 2016-10-23 DIAGNOSIS — Z86711 Personal history of pulmonary embolism: Secondary | ICD-10-CM | POA: Insufficient documentation

## 2016-10-23 DIAGNOSIS — G8929 Other chronic pain: Secondary | ICD-10-CM | POA: Insufficient documentation

## 2016-10-23 DIAGNOSIS — E119 Type 2 diabetes mellitus without complications: Secondary | ICD-10-CM | POA: Diagnosis not present

## 2016-10-23 DIAGNOSIS — I10 Essential (primary) hypertension: Secondary | ICD-10-CM | POA: Diagnosis not present

## 2016-10-23 DIAGNOSIS — Z23 Encounter for immunization: Secondary | ICD-10-CM | POA: Insufficient documentation

## 2016-10-23 DIAGNOSIS — Z79899 Other long term (current) drug therapy: Secondary | ICD-10-CM | POA: Insufficient documentation

## 2016-10-23 DIAGNOSIS — Z7901 Long term (current) use of anticoagulants: Secondary | ICD-10-CM | POA: Diagnosis not present

## 2016-10-23 DIAGNOSIS — M1991 Primary osteoarthritis, unspecified site: Secondary | ICD-10-CM | POA: Diagnosis not present

## 2016-10-23 DIAGNOSIS — E7800 Pure hypercholesterolemia, unspecified: Secondary | ICD-10-CM | POA: Insufficient documentation

## 2016-10-23 DIAGNOSIS — E1310 Other specified diabetes mellitus with ketoacidosis without coma: Secondary | ICD-10-CM

## 2016-10-23 DIAGNOSIS — M159 Polyosteoarthritis, unspecified: Secondary | ICD-10-CM

## 2016-10-23 DIAGNOSIS — R0602 Shortness of breath: Secondary | ICD-10-CM | POA: Diagnosis not present

## 2016-10-23 DIAGNOSIS — Z7984 Long term (current) use of oral hypoglycemic drugs: Secondary | ICD-10-CM | POA: Insufficient documentation

## 2016-10-23 DIAGNOSIS — D509 Iron deficiency anemia, unspecified: Secondary | ICD-10-CM | POA: Diagnosis not present

## 2016-10-23 DIAGNOSIS — M545 Low back pain: Secondary | ICD-10-CM | POA: Insufficient documentation

## 2016-10-23 DIAGNOSIS — M150 Primary generalized (osteo)arthritis: Secondary | ICD-10-CM

## 2016-10-23 DIAGNOSIS — Z1159 Encounter for screening for other viral diseases: Secondary | ICD-10-CM

## 2016-10-23 LAB — IRON AND TIBC
%SAT: 3 % — AB (ref 11–50)
IRON: 13 ug/dL — AB (ref 45–160)
TIBC: 512 ug/dL — ABNORMAL HIGH (ref 250–450)
UIBC: 499 ug/dL — ABNORMAL HIGH (ref 125–400)

## 2016-10-23 LAB — CBC
HEMATOCRIT: 28.9 % — AB (ref 35.0–45.0)
Hemoglobin: 7.7 g/dL — ABNORMAL LOW (ref 11.7–15.5)
MCH: 18.1 pg — ABNORMAL LOW (ref 27.0–33.0)
MCHC: 26.6 g/dL — ABNORMAL LOW (ref 32.0–36.0)
MCV: 68 fL — AB (ref 80.0–100.0)
MPV: 9.1 fL (ref 7.5–12.5)
PLATELETS: 418 10*3/uL — AB (ref 140–400)
RBC: 4.25 MIL/uL (ref 3.80–5.10)
RDW: 19.2 % — AB (ref 11.0–15.0)
WBC: 6.2 10*3/uL (ref 3.8–10.8)

## 2016-10-23 LAB — LIPID PANEL
CHOL/HDL RATIO: 3.8 ratio (ref <5.0)
Cholesterol: 214 mg/dL — ABNORMAL HIGH (ref <200)
HDL: 57 mg/dL (ref >50)
LDL CALC: 131 mg/dL — AB (ref <100)
TRIGLYCERIDES: 128 mg/dL (ref <150)
VLDL: 26 mg/dL (ref <30)

## 2016-10-23 LAB — BASIC METABOLIC PANEL WITH GFR
BUN: 14 mg/dL (ref 7–25)
CALCIUM: 9.4 mg/dL (ref 8.6–10.4)
CO2: 24 mmol/L (ref 20–31)
CREATININE: 0.65 mg/dL (ref 0.50–0.99)
Chloride: 104 mmol/L (ref 98–110)
GFR, Est African American: >89 mL/min (ref >=60)
GFR, Est Non African American: >89 mL/min (ref >=60)
Glucose, Bld: 110 mg/dL — ABNORMAL HIGH (ref 65–99)
Potassium: 4.3 mmol/L (ref 3.5–5.3)
SODIUM: 137 mmol/L (ref 135–146)

## 2016-10-23 LAB — FERRITIN: Ferritin: 3 ng/mL — ABNORMAL LOW (ref 20–288)

## 2016-10-23 LAB — POCT GLYCOSYLATED HEMOGLOBIN (HGB A1C): Hemoglobin A1C: 6.3

## 2016-10-23 LAB — GLUCOSE, POCT (MANUAL RESULT ENTRY): POC Glucose: 118 mg/dl — AB (ref 70–99)

## 2016-10-23 MED ORDER — LOSARTAN POTASSIUM-HCTZ 50-12.5 MG PO TABS: 1.0000 | ORAL_TABLET | Freq: Every day | ORAL | 5 refills | Status: DC

## 2016-10-23 MED ORDER — ATORVASTATIN CALCIUM 20 MG PO TABS: 20.0000 mg | ORAL_TABLET | Freq: Every day | ORAL | 3 refills | Status: DC

## 2016-10-23 MED ORDER — DICLOFENAC POTASSIUM 50 MG PO TABS
50.0000 mg | ORAL_TABLET | Freq: Two times a day (BID) | ORAL | 2 refills | Status: DC
Start: 2016-10-23 — End: 2017-05-23

## 2016-10-23 MED ORDER — FERROUS SULFATE 325 (65 FE) MG PO TABS: 325.0000 mg | ORAL_TABLET | Freq: Two times a day (BID) | ORAL | 3 refills | Status: DC

## 2016-10-23 MED ORDER — ACETAMINOPHEN 500 MG PO TABS: 1000.0000 mg | ORAL_TABLET | Freq: Three times a day (TID) | ORAL | 5 refills | Status: AC | PRN

## 2016-10-23 MED ORDER — METFORMIN HCL ER 500 MG PO TB24: 500.0000 mg | ORAL_TABLET | Freq: Every day | ORAL | 3 refills | Status: DC

## 2016-10-23 MED FILL — LOSARTAN-HCTZ 50-12.5 MG TA: 50-12.5 | 30 days supply | Qty: 30 | Fill #0

## 2016-10-23 MED FILL — ATORVASTATIN 20 MG TABLET: 20 | 30 days supply | Qty: 30 | Fill #0

## 2016-10-23 MED FILL — FERROUS SULFATE 325 MG TAB: 325 (65 FE) | 30 days supply | Qty: 60 | Fill #0

## 2016-10-23 MED FILL — METFORMIN HCL ER 500 MG TAB: 500 | 30 days supply | Qty: 30 | Fill #0

## 2016-10-23 MED FILL — DICLOFENAC POT 50 MG TABLET: 50 | 30 days supply | Qty: 60 | Fill #0

## 2016-10-23 NOTE — Patient Instructions (Addendum)
Brenda Clark was seen today for diabetes.  Diagnoses and all orders for this visit:  Controlled type 2 diabetes mellitus without complication, without long-term current use of insulin (HCC) -     POCT glucose (manual entry) -     POCT glycosylated hemoglobin (Hb A1C) -     metFORMIN (GLUCOPHAGE-XR) 500 MG 24 hr tablet; Take 1 tablet (500 mg total) by mouth daily with breakfast. -     BASIC METABOLIC PANEL WITH GFR  Hypertension, unspecified type -     losartan-hydrochlorothiazide (HYZAAR) 50-12.5 MG tablet; Take 1 tablet by mouth daily. -     BASIC METABOLIC PANEL WITH GFR  Pure hypercholesterolemia -     Lipid Panel -     atorvastatin (LIPITOR) 20 MG tablet; Take 1 tablet (20 mg total) by mouth daily.  Iron deficiency anemia, unspecified iron deficiency anemia type -     CBC -     Iron and TIBC -     ferrous sulfate 325 (65 FE) MG tablet; Take 1 tablet (325 mg total) by mouth 2 (two) times daily with a meal.  MORBID OBESITY  Need for hepatitis C screening test -     Hepatitis C antibody, reflex  Primary osteoarthritis involving multiple joints -     diclofenac (CATAFLAM) 50 MG tablet; Take 1 tablet (50 mg total) by mouth 2 (two) times daily with a meal. -     acetaminophen (TYLENOL) 500 MG tablet; Take 2 tablets (1,000 mg total) by mouth every 8 (eight) hours as needed.   Work on exercising every day to treat arthritis, walk, join silver sneakers and participate in water aerobics    F/u in 4 weeks for HTN  Dr. Armen PickupFunches

## 2016-10-23 NOTE — Assessment & Plan Note (Signed)
HTN with edema in feet and ankles in setting of anemia  Plan: Losartan-HCTZ Checking CBC and BMP with GFR

## 2016-10-23 NOTE — Progress Notes (Signed)
Pt is here today to re establish care and follow up on diabetes, and HTN.  Pt is getting flu shot today.  Pt has not had any  medication in 1 month.

## 2016-10-23 NOTE — Assessment & Plan Note (Signed)
Chronic anemia with SOB   Checking CBC and iron studies Stressed importance of iron compliance

## 2016-10-23 NOTE — Assessment & Plan Note (Signed)
Diabetes remains controlled Refilled metformin  Advised increase exercise

## 2016-10-23 NOTE — Assessment & Plan Note (Signed)
OA with uncontrolled pain  Increase exercise Continue tylenol dose changed to 1000 mg TID Add diclofenac

## 2016-10-23 NOTE — Assessment & Plan Note (Signed)
Morbidly obese with obesity related complications, DM, hypoventilation, OA  Advised increase exercise Advised patient join silver sneakers

## 2016-10-23 NOTE — Progress Notes (Signed)
 Subjective:  Patient ID: Brenda Clark, female    DOB: 12/29/1950  Age: 65 y.o. MRN: [id number]  CC: Diabetes   HPI Brenda Clark has hx of DVT/PE, morbid obesity, obesity hypoventilation on CPAP, HTN, DM, osteoarthritis, iron deficiency anemia she  presents for    1. Diabetes type 2: taking metformin. Has reduced fat in diet. Would like to lose weight but not exercising. Due for f/u with opthalmology.   2. HTN: no medication for past month. Has swelling in feet and ankles. Has chronic SOB. No HA, CP.   3. Hx of PE: diagnosed in 2012. It started with L leg pain. She was developed shortness of breath. Was diagnosed with DVT and PE. She was anticoagulated for 4-5 years. She reports 1 episode of clot. No family history of VTE. She stopped taking anticoagulation on year.   4. Osteoarthritis: she has chronic pain in hands, low back, hips and knees. She is not active. She is obese. She takes tylenol for pain without relief her current level of pain is 9/10.   5. IDA: she has hx of anemia. Not currently on anticoagulation. Taking iron but not twice daily. She reports SOB. No dizziness or lightheadedness.    Social History  Substance Use Topics  . Smoking status: Never Smoker  . Smokeless tobacco: Never Used  . Alcohol use No   Past Medical History:  Diagnosis Date  . Arthritis    DDD and HNP on MRI in 2008  . 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 Chronic pain   . Endometrial polyp 08/2010   biopsied.   . GERD (gastroesophageal reflux disease)   . Headache, migraine   . Hiatal hernia 03/05/2014  . Hyperlipidemia   . Iron deficiency anemia, unspecified    ice pica  . Migraine, unspecified, without mention of intractable migraine without mention of status migrainosus   . Morbid obesity ([facility])   . OSA (obstructive sleep apnea)   . Osteoarthrosis, unspecified whether generalized or localized, lower leg   . Other and unspecified hyperlipidemia   . Pulmonary embolus ([facility]) 2012  . Retinal  detachment   . Type II or unspecified type diabetes mellitus without mention of complication, not stated as uncontrolled   . Unspecified essential hypertension    Outpatient Medications Prior to Visit  Medication Sig Dispense Refill  . ACCU-CHEK AVIVA PLUS test strip TEST TWICE A DAY BEFORE MEALS AND AT BEDTIME 50 each 12  . ACCU-CHEK SOFTCLIX LANCETS lancets 1 each as needed. Reported on 01/19/2016    . acetaminophen (TYLENOL) 650 MG CR tablet Take 1,300 mg by mouth every 8 (eight) hours as needed for pain. Reported on 01/19/2016    . amLODipine (NORVASC) 10 MG tablet Take 1 tablet (10 mg total) by mouth daily. 90 tablet 1  . atorvastatin (LIPITOR) 20 MG tablet Take 1 tablet (20 mg total) by mouth daily. 90 tablet 1  . diphenhydramine-acetaminophen (TYLENOL PM) 25-500 MG TABS Take 1 tablet by mouth at bedtime as needed (for sleep). Reported on 01/19/2016    . ferrous sulfate 325 (65 FE) MG tablet Take 1 tablet (325 mg total) by mouth 2 (two) times daily with a meal. 90 tablet 1  . glucose monitoring kit (FREESTYLE) monitoring kit 1 each by Does not apply route as needed for other. Dispense any model that is covered- dispense testing supplies for Q AC/ HS accuchecks- 1 month supply with 6 refills. 1 each 1  . lisinopril (PRINIVIL,ZESTRIL) 20 MG tablet Take   1 tablet (20 mg total) by mouth daily. 90 tablet 1  . metFORMIN (GLUCOPHAGE-XR) 500 MG 24 hr tablet Take 1 tablet (500 mg total) by mouth daily with breakfast. 90 tablet 1  . nitroGLYCERIN (NITROSTAT) 0.4 MG SL tablet Place 0.4 mg under the tongue every 5 (five) minutes as needed. Reported on 01/19/2016    . omeprazole (PRILOSEC) 20 MG capsule TAKE 2 CAPSULES BY MOUTH DAILY 60 capsule 2  . rivaroxaban (XARELTO) 20 MG TABS tablet Take 1 tablet (20 mg total) by mouth daily with supper. 90 tablet 1   No facility-administered medications prior to visit.     ROS Review of Systems  Constitutional: Negative for chills and fever.  Eyes: Negative for  visual disturbance.  Respiratory: Positive for shortness of breath.   Cardiovascular: Negative for chest pain.  Gastrointestinal: Negative for abdominal pain and blood in stool.  Musculoskeletal: Positive for arthralgias, back pain and gait problem.  Skin: Negative for rash.  Allergic/Immunologic: Negative for immunocompromised state.  Hematological: Negative for adenopathy. Does not bruise/bleed easily.  Psychiatric/Behavioral: Positive for sleep disturbance. Negative for dysphoric mood and suicidal ideas.    Objective:  BP (!) 158/88 (BP Location: Right Wrist, Patient Position: Sitting, Cuff Size: Small)   Pulse 100   Temp 98 F (36.7 C) (Oral)   Ht 5' 4" (1.626 m)   Wt (!) 384 lb (174.2 kg)   SpO2 97%   BMI 65.91 kg/m   BP/Weight 10/23/2016 01/19/2016 3/97/6734  Systolic BP 193 790 240  Diastolic BP 88 71 90  Wt. (Lbs) 384 395 388  BMI 65.91 67.77 66.57   Physical Exam  Constitutional: She is oriented to person, place, and time. She appears well-developed and well-nourished. No distress.  Morbidly obese   HENT:  Head: Normocephalic and atraumatic.  Cardiovascular: Normal rate, regular rhythm, normal heart sounds and intact distal pulses.   Pulmonary/Chest: Effort normal and breath sounds normal.  Musculoskeletal: She exhibits edema (in ankles and feet ).  Neurological: She is alert and oriented to person, place, and time.  Skin: Skin is warm and dry. No rash noted.  Psychiatric: She has a normal mood and affect.   Lab Results  Component Value Date   HGBA1C 5.90 01/19/2016   Lab Results  Component Value Date   HGBA1C 6.3 10/23/2016    CBG 118  CBC    Component Value Date/Time   WBC 4.6 01/19/2016 1232   RBC 4.01 01/19/2016 1232   HGB 7.9 (L) 01/19/2016 1232   HCT 27.8 (L) 01/19/2016 1232   PLT 329 01/19/2016 1232   MCV 69.3 (L) 01/19/2016 1232   MCH 19.7 (L) 01/19/2016 1232   MCHC 28.4 (L) 01/19/2016 1232   RDW 17.7 (H) 01/19/2016 1232   LYMPHSABS 1.2  01/19/2016 1232   MONOABS 0.4 01/19/2016 1232   EOSABS 0.0 01/19/2016 1232   BASOSABS 0.0 01/19/2016 1232   Depression screen PHQ 2/9 10/23/2016 01/19/2016 09/02/2015  Decreased Interest 0 2 0  Down, Depressed, Hopeless 0 1 0  PHQ - 2 Score 0 3 0  Altered sleeping 2 2 -  Tired, decreased energy 3 3 -  Change in appetite 2 2 -  Feeling bad or failure about yourself  0 0 -  Trouble concentrating 0 1 -  Moving slowly or fidgety/restless 0 2 -  Suicidal thoughts 0 0 -  PHQ-9 Score 7 13 -   GAD 7 : Generalized Anxiety Score 10/23/2016 01/19/2016  Nervous, Anxious, on Edge 0 1  Control/stop worrying 0 0  Worry too much - different things 0 0  Trouble relaxing 0 0  Restless 0 0  Easily annoyed or irritable 0 1  Afraid - awful might happen 0 -  Total GAD 7 Score 0 -     Assessment & Plan:   Brenda Clark was seen today for diabetes.  Diagnoses and all orders for this visit:  Controlled type 2 diabetes mellitus without complication, without long-term current use of insulin (HCC) -     POCT glucose (manual entry) -     POCT glycosylated hemoglobin (Hb A1C) -     metFORMIN (GLUCOPHAGE-XR) 500 MG 24 hr tablet; Take 1 tablet (500 mg total) by mouth daily with breakfast. -     BASIC METABOLIC PANEL WITH GFR  Hypertension, unspecified type -     losartan-hydrochlorothiazide (HYZAAR) 50-12.5 MG tablet; Take 1 tablet by mouth daily. -     BASIC METABOLIC PANEL WITH GFR  Pure hypercholesterolemia -     Lipid Panel -     atorvastatin (LIPITOR) 20 MG tablet; Take 1 tablet (20 mg total) by mouth daily.  Iron deficiency anemia, unspecified iron deficiency anemia type -     CBC -     Iron and TIBC -     ferrous sulfate 325 (65 FE) MG tablet; Take 1 tablet (325 mg total) by mouth 2 (two) times daily with a meal. -     Ferritin  MORBID OBESITY  Need for hepatitis C screening test -     Hepatitis C antibody, reflex  Primary osteoarthritis involving multiple joints -     diclofenac (CATAFLAM) 50  MG tablet; Take 1 tablet (50 mg total) by mouth 2 (two) times daily with a meal. -     acetaminophen (TYLENOL) 500 MG tablet; Take 2 tablets (1,000 mg total) by mouth every 8 (eight) hours as needed.  Needs flu shot -     Flu Vaccine QUAD 36+ mos IM    No orders of the defined types were placed in this encounter.   Follow-up: Return in about 4 weeks (around 11/20/2016) for HTN.   Josalyn Funches MD   

## 2016-10-24 LAB — HEPATITIS C ANTIBODY: HCV AB: NEGATIVE

## 2016-10-25 MED ORDER — ATORVASTATIN CALCIUM 40 MG PO TABS: 40.0000 mg | ORAL_TABLET | Freq: Every day | ORAL | 11 refills | Status: DC

## 2016-10-25 NOTE — Assessment & Plan Note (Signed)
High intensity statin recommended Change lipitor to 40 mg daily

## 2016-10-25 NOTE — Addendum Note (Signed)
Addended by: Dessa PhiFUNCHES, Hanaa Payes on: 10/25/2016 01:35 PM   Modules accepted: Orders

## 2016-10-26 ENCOUNTER — Telehealth: Payer: Self-pay

## 2016-10-26 NOTE — Telephone Encounter (Signed)
Pt was called and informed about lab results. Pt understand that medications have been changed.

## 2016-11-20 ENCOUNTER — Encounter: Payer: Self-pay | Admitting: Family Medicine

## 2016-11-20 ENCOUNTER — Ambulatory Visit: Payer: Medicare Other | Attending: Family Medicine | Admitting: Family Medicine

## 2016-11-20 VITALS — BP 154/77 | HR 91 | Temp 97.7°F | Ht 64.0 in | Wt 391.0 lb

## 2016-11-20 DIAGNOSIS — Z79899 Other long term (current) drug therapy: Secondary | ICD-10-CM | POA: Insufficient documentation

## 2016-11-20 DIAGNOSIS — Z6841 Body Mass Index (BMI) 40.0 and over, adult: Secondary | ICD-10-CM | POA: Diagnosis not present

## 2016-11-20 DIAGNOSIS — D509 Iron deficiency anemia, unspecified: Secondary | ICD-10-CM | POA: Diagnosis not present

## 2016-11-20 DIAGNOSIS — I10 Essential (primary) hypertension: Secondary | ICD-10-CM | POA: Insufficient documentation

## 2016-11-20 DIAGNOSIS — R0602 Shortness of breath: Secondary | ICD-10-CM | POA: Insufficient documentation

## 2016-11-20 DIAGNOSIS — E2839 Other primary ovarian failure: Secondary | ICD-10-CM | POA: Insufficient documentation

## 2016-11-20 DIAGNOSIS — E119 Type 2 diabetes mellitus without complications: Secondary | ICD-10-CM | POA: Insufficient documentation

## 2016-11-20 DIAGNOSIS — Z7984 Long term (current) use of oral hypoglycemic drugs: Secondary | ICD-10-CM | POA: Insufficient documentation

## 2016-11-20 DIAGNOSIS — Z1231 Encounter for screening mammogram for malignant neoplasm of breast: Secondary | ICD-10-CM

## 2016-11-20 DIAGNOSIS — K219 Gastro-esophageal reflux disease without esophagitis: Secondary | ICD-10-CM | POA: Insufficient documentation

## 2016-11-20 LAB — GLUCOSE, POCT (MANUAL RESULT ENTRY): POC GLUCOSE: 115 mg/dL — AB (ref 70–99)

## 2016-11-20 MED ORDER — OMEPRAZOLE 20 MG PO CPDR: 40.0000 mg | DELAYED_RELEASE_CAPSULE | Freq: Every day | ORAL | 11 refills | Status: DC

## 2016-11-20 MED ORDER — LOSARTAN POTASSIUM-HCTZ 50-12.5 MG PO TABS: 2.0000 | ORAL_TABLET | Freq: Every day | ORAL | 11 refills | Status: DC

## 2016-11-20 MED ORDER — GLUCOSE BLOOD VI STRP: 1.0000 | ORAL_STRIP | Freq: Two times a day (BID) | 12 refills | Status: DC

## 2016-11-20 MED ORDER — ACCU-CHEK AVIVA PLUS W/DEVICE KIT: 1.0000 | PACK | Freq: Two times a day (BID) | 0 refills | Status: AC

## 2016-11-20 MED FILL — ACCU-CHEK AVIVA PLUS METER: W/DEVICE | 1 days supply | Qty: 1 | Fill #0

## 2016-11-20 MED FILL — ATORVASTATIN 40 MG TABLET: 40 | 30 days supply | Qty: 30 | Fill #0

## 2016-11-20 MED FILL — LOSARTAN-HCTZ 50-12.5 MG TA: 50-12.5 | 30 days supply | Qty: 60 | Fill #0

## 2016-11-20 MED FILL — OMEPRAZOLE DR 20 MG CAPSULE: 20 | 30 days supply | Qty: 60 | Fill #0

## 2016-11-20 MED FILL — ACCU-CHEK AVIVA PLUS TEST S: 30 days supply | Qty: 100 | Fill #0

## 2016-11-20 NOTE — Assessment & Plan Note (Signed)
Compliant with oral iron Start iron rich foods F/u check in 2 months Plan for IV iron if patient's anemia and iron stores have not improved

## 2016-11-20 NOTE — Progress Notes (Signed)
Subjective:  Patient ID: Brenda Clark, female    DOB: 03-23-1951  Age: 65 y.o. MRN: [id number]  CC: Hypertension   HPI Brenda Clark has hx of DVT/PE, morbid obesity, obesity hypoventilation on CPAP, HTN, DM, osteoarthritis, iron deficiency anemia she  presents for   1.HTN: compliant with Hyzaar. Improved foot swelling.  Has chronic SOB. No HA, CP.   2. IDA: complaint with twice daily iron. She reports SOB. No dizziness or lightheadedness.  She reports that she has had to have IV iron therapy in the past.   Social History  Substance Use Topics  . Smoking status: Never Smoker  . Smokeless tobacco: Never Used  . Alcohol use No   Past Medical History:  Diagnosis Date  . Arthritis    DDD and HNP on MRI in 2008  . 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 Chronic pain   . Endometrial polyp 08/2010   biopsied.   Marland Kitchen GERD (gastroesophageal reflux disease)   . Headache, migraine   . Hiatal hernia 03/05/2014  . Hyperlipidemia   . Iron deficiency anemia, unspecified    ice pica  . Migraine, unspecified, without mention of intractable migraine without mention of status migrainosus   . Morbid obesity ([place])   . OSA (obstructive sleep apnea)   . Osteoarthrosis, unspecified whether generalized or localized, lower leg   . Other and unspecified hyperlipidemia   . Pulmonary embolus ([place]) 2012  . Retinal detachment   . Type II or unspecified type diabetes mellitus without mention of complication, not stated as uncontrolled   . Unspecified essential hypertension    Outpatient Medications Prior to Visit  Medication Sig Dispense Refill  . ACCU-CHEK AVIVA PLUS test strip TEST TWICE A DAY BEFORE MEALS AND AT BEDTIME 50 each 12  . ACCU-CHEK SOFTCLIX LANCETS lancets 1 each as needed. Reported on 01/19/2016    . acetaminophen (TYLENOL) 500 MG tablet Take 2 tablets (1,000 mg total) by mouth every 8 (eight) hours as needed. 180 tablet 5  . atorvastatin (LIPITOR) 40 MG tablet Take 1 tablet (40 mg total) by mouth  daily. 30 tablet 11  . diclofenac (CATAFLAM) 50 MG tablet Take 1 tablet (50 mg total) by mouth 2 (two) times daily with a meal. 60 tablet 2  . ferrous sulfate 325 (65 FE) MG tablet Take 1 tablet (325 mg total) by mouth 2 (two) times daily with a meal. 180 tablet 3  . glucose monitoring kit (FREESTYLE) monitoring kit 1 each by Does not apply route as needed for other. Dispense any model that is covered- dispense testing supplies for Q AC/ HS accuchecks- 1 month supply with 6 refills. 1 each 1  . losartan-hydrochlorothiazide (HYZAAR) 50-12.5 MG tablet Take 1 tablet by mouth daily. 30 tablet 5  . metFORMIN (GLUCOPHAGE-XR) 500 MG 24 hr tablet Take 1 tablet (500 mg total) by mouth daily with breakfast. 90 tablet 3  . nitroGLYCERIN (NITROSTAT) 0.4 MG SL tablet Place 0.4 mg under the tongue every 5 (five) minutes as needed. Reported on 01/19/2016    . omeprazole (PRILOSEC) 20 MG capsule TAKE 2 CAPSULES BY MOUTH DAILY 60 capsule 2   No facility-administered medications prior to visit.     ROS Review of Systems  Constitutional: Negative for chills and fever.  Eyes: Negative for visual disturbance.  Respiratory: Positive for shortness of breath.   Cardiovascular: Negative for chest pain.  Gastrointestinal: Negative for abdominal pain and blood in stool.  Musculoskeletal: Positive for arthralgias, back pain and gait  problem.  Skin: Negative for rash.  Allergic/Immunologic: Negative for immunocompromised state.  Hematological: Negative for adenopathy. Does not bruise/bleed easily.  Psychiatric/Behavioral: Positive for sleep disturbance. Negative for dysphoric mood and suicidal ideas.    Objective:  BP (!) 154/77 (BP Location: Right Wrist, Patient Position: Sitting, Cuff Size: Small)   Pulse 91   Temp 97.7 F (36.5 C) (Oral)   Ht '5\' 4"'$  (1.626 m)   Wt (!) 391 lb (177.4 kg)   SpO2 98%   BMI 67.11 kg/m   BP/Weight 11/20/2016 09/81/1914 06/17/2955  Systolic BP 213 086 578  Diastolic BP 77 88 71    Wt. (Lbs) 391 384 395  BMI 67.11 65.91 67.77   Physical Exam  Constitutional: She is oriented to person, place, and time. She appears well-developed and well-nourished. No distress.  Morbidly obese   HENT:  Head: Normocephalic and atraumatic.  Cardiovascular: Normal rate, regular rhythm, normal heart sounds and intact distal pulses.   Pulmonary/Chest: Effort normal and breath sounds normal.  Musculoskeletal: She exhibits edema (in ankles and feet ).  Neurological: She is alert and oriented to person, place, and time.  Skin: Skin is warm and dry. No rash noted.  Psychiatric: She has a normal mood and affect.   Lab Results  Component Value Date   HGBA1C 6.3 10/23/2016   Lab Results  Component Value Date   HGBA1C 6.3 10/23/2016    CBG 115   CBC    Component Value Date/Time   WBC 6.2 10/23/2016 1123   RBC 4.25 10/23/2016 1123   HGB 7.7 (L) 10/23/2016 1123   HCT 28.9 (L) 10/23/2016 1123   PLT 418 (H) 10/23/2016 1123   MCV 68.0 (L) 10/23/2016 1123   MCH 18.1 (L) 10/23/2016 1123   MCHC 26.6 (L) 10/23/2016 1123   RDW 19.2 (H) 10/23/2016 1123   LYMPHSABS 1.2 01/19/2016 1232   MONOABS 0.4 01/19/2016 1232   EOSABS 0.0 01/19/2016 1232   BASOSABS 0.0 01/19/2016 1232   Depression screen PHQ 2/9 10/23/2016 01/19/2016 09/02/2015  Decreased Interest 0 2 0  Down, Depressed, Hopeless 0 1 0  PHQ - 2 Score 0 3 0  Altered sleeping 2 2 -  Tired, decreased energy 3 3 -  Change in appetite 2 2 -  Feeling bad or failure about yourself  0 0 -  Trouble concentrating 0 1 -  Moving slowly or fidgety/restless 0 2 -  Suicidal thoughts 0 0 -  PHQ-9 Score 7 13 -   GAD 7 : Generalized Anxiety Score 10/23/2016 01/19/2016  Nervous, Anxious, on Edge 0 1  Control/stop worrying 0 0  Worry too much - different things 0 0  Trouble relaxing 0 0  Restless 0 0  Easily annoyed or irritable 0 1  Afraid - awful might happen 0 -  Total GAD 7 Score 0 -     Assessment & Plan:   Brenda Clark was seen today  for hypertension.  Diagnoses and all orders for this visit:  Controlled type 2 diabetes mellitus without complication, without long-term current use of insulin (HCC) -     POCT glucose (manual entry) -     Blood Glucose Monitoring Suppl (ACCU-CHEK AVIVA PLUS) w/Device KIT; 1 Device by Does not apply route 2 (two) times daily. -     glucose blood (ACCU-CHEK AVIVA PLUS) test strip; 1 each by Other route 2 (two) times daily.  Hypertension, unspecified type -     losartan-hydrochlorothiazide (HYZAAR) 50-12.5 MG tablet; Take 2 tablets by  mouth daily.  Iron deficiency anemia, unspecified iron deficiency anemia type  Estrogen deficiency -     DG Bone Density; Future  Visit for screening mammogram -     MM DIGITAL SCREENING BILATERAL; Future  Gastroesophageal reflux disease without esophagitis -     omeprazole (PRILOSEC) 20 MG capsule; Take 2 capsules (40 mg total) by mouth daily.    Meds ordered this encounter  Medications  . losartan-hydrochlorothiazide (HYZAAR) 50-12.5 MG tablet    Sig: Take 2 tablets by mouth daily.    Dispense:  60 tablet    Refill:  11  . Blood Glucose Monitoring Suppl (ACCU-CHEK AVIVA PLUS) w/Device KIT    Sig: 1 Device by Does not apply route 2 (two) times daily.    Dispense:  1 kit    Refill:  0  . glucose blood (ACCU-CHEK AVIVA PLUS) test strip    Sig: 1 each by Other route 2 (two) times daily.    Dispense:  100 each    Refill:  12  . omeprazole (PRILOSEC) 20 MG capsule    Sig: Take 2 capsules (40 mg total) by mouth daily.    Dispense:  60 capsule    Refill:  11    Follow-up: Return in about 2 months (around 01/21/2017) for iron deficiency anemia .   Boykin Nearing MD

## 2016-11-20 NOTE — Progress Notes (Signed)
Pt is here to follow up on HTN. Pt states that she needs a new meter and test strips nitrostat, and omeprazole.

## 2016-11-20 NOTE — Assessment & Plan Note (Signed)
Improved Med: compliant Increase hyzaar to 2 tabs daily

## 2016-11-20 NOTE — Patient Instructions (Addendum)
Brenda Clark was seen today for hypertension.  Diagnoses and all orders for this visit:  Controlled type 2 diabetes mellitus without complication, without long-term current use of insulin (HCC) -     POCT glucose (manual entry) -     Blood Glucose Monitoring Suppl (ACCU-CHEK AVIVA PLUS) w/Device KIT; 1 Device by Does not apply route 2 (two) times daily. -     glucose blood (ACCU-CHEK AVIVA PLUS) test strip; 1 each by Other route 2 (two) times daily.  Hypertension, unspecified type -     losartan-hydrochlorothiazide (HYZAAR) 50-12.5 MG tablet; Take 2 tablets by mouth daily.  Iron deficiency anemia, unspecified iron deficiency anemia type  Estrogen deficiency -     DG Bone Density; Future  Visit for screening mammogram -     MM DIGITAL SCREENING BILATERAL; Future  Gastroesophageal reflux disease without esophagitis -     omeprazole (PRILOSEC) 20 MG capsule; Take 2 capsules (40 mg total) by mouth daily.   Schedule mammogram and bone density scan  F/u in 2 months for iron recheck  Dr. Adrian Blackwater  Iron-Rich Diet Introduction Iron is a mineral that helps your body to produce hemoglobin. Hemoglobin is a protein in your red blood cells that carries oxygen to your body's tissues. Eating too little iron may cause you to feel weak and tired, and it can increase your risk for infection. Eating enough iron is necessary for your body's metabolism, muscle function, and nervous system. Iron is naturally found in many foods. It can also be added to foods or fortified in foods. There are two types of dietary iron:  Heme iron. Heme iron is absorbed by the body more easily than nonheme iron. Heme iron is found in meat, poultry, and fish.  Nonheme iron. Nonheme iron is found in dietary supplements, iron-fortified grains, beans, and vegetables. You may need to follow an iron-rich diet if:  You have been diagnosed with iron deficiency or iron-deficiency anemia.  You have a condition that prevents you from  absorbing dietary iron, such as:  Infection in your intestines.  Celiac disease. This involves long-lasting (chronic) inflammation of your intestines.  You do not eat enough iron.  You eat a diet that is high in foods that impair iron absorption.  You have lost a lot of blood.  You have heavy bleeding during your menstrual cycle.  You are pregnant. What is my plan? Your health care provider may help you to determine how much iron you need per day based on your condition. Generally, when a person consumes sufficient amounts of iron in the diet, the following iron needs are met:  Men.  30-38 years old: 11 mg per day.  77-63 years old: 8 mg per day.  Women.  8-31 years old: 15 mg per day.  38-47 years old: 18 mg per day.  Over 37 years old: 8 mg per day.  Pregnant women: 27 mg per day.  Breastfeeding women: 9 mg per day. What do I need to know about an iron-rich diet?  Eat fresh fruits and vegetables that are high in vitamin C along with foods that are high in iron. This will help increase the amount of iron that your body absorbs from food, especially with foods containing nonheme iron. Foods that are high in vitamin C include oranges, peppers, tomatoes, and mango.  Take iron supplements only as directed by your health care provider. Overdose of iron can be life-threatening. If you were prescribed iron supplements, take them with orange juice or  a vitamin C supplement.  Cook foods in pots and pans that are made from iron.  Eat nonheme iron-containing foods alongside foods that are high in heme iron. This helps to improve your iron absorption.  Certain foods and drinks contain compounds that impair iron absorption. Avoid eating these foods in the same meal as iron-rich foods or with iron supplements. These include:  Coffee, black tea, and red wine.  Milk, dairy products, and foods that are high in calcium.  Beans, soybeans, and peas.  Whole grains.  When eating  foods that contain both nonheme iron and compounds that impair iron absorption, follow these tips to absorb iron better.  Soak beans overnight before cooking.  Soak whole grains overnight and drain them before using.  Ferment flours before baking, such as using yeast in bread dough. What foods can I eat? Grains  Iron-fortified breakfast cereal. Iron-fortified whole-wheat bread. Enriched rice. Sprouted grains. Vegetables  Spinach. Potatoes with skin. Green peas. Broccoli. Red and green bell peppers. Fermented vegetables. Fruits  Prunes. Raisins. Oranges. Strawberries. Mango. Grapefruit. Meats and Other Protein Sources  Beef liver. Oysters. Beef. Shrimp. [place]. Chicken. [place]. Sardines. Chickpeas. Nuts. Tofu. Beverages  Tomato juice. Fresh orange juice. Prune juice. Hibiscus tea. Fortified instant breakfast shakes. Condiments  Tahini. Fermented soy sauce. Sweets and Desserts  Black-strap molasses. Other  Wheat germ. The items listed above may not be a complete list of recommended foods or beverages. Contact your dietitian for more options.  What foods are not recommended? Grains  Whole grains. Bran cereal. Bran flour. Oats. Vegetables  Artichokes. Brussels sprouts. Kale. Fruits  Blueberries. Raspberries. Strawberries. Figs. Meats and Other Protein Sources  Soybeans. Products made from soy protein. Dairy  Milk. Cream. Cheese. Yogurt. Cottage cheese. Beverages  Coffee. Black tea. Red wine. Sweets and Desserts  Cocoa. Chocolate. Ice cream. Other  Basil. Oregano. Parsley. The items listed above may not be a complete list of foods and beverages to avoid. Contact your dietitian for more information.  This information is not intended to replace advice given to you by your health care provider. Make sure you discuss any questions you have with your health care provider. Document Released: 07/10/2005 Document Revised: 06/15/2016 Document Reviewed: 06/23/2014  2017 Elsevier

## 2017-01-25 ENCOUNTER — Ambulatory Visit: Payer: Self-pay | Admitting: Family Medicine

## 2017-02-04 ENCOUNTER — Ambulatory Visit: Payer: Self-pay | Admitting: Family Medicine

## 2017-02-11 MED FILL — DICLOFENAC POT 50 MG TABLET: 50 | 30 days supply | Qty: 60 | Fill #1

## 2017-02-11 MED FILL — OMEPRAZOLE DR 20 MG CAPSULE: 20 | 30 days supply | Qty: 60 | Fill #1

## 2017-02-11 MED FILL — METFORMIN HCL ER 500 MG TAB: 500 | 30 days supply | Qty: 30 | Fill #1

## 2017-02-11 MED FILL — LOSARTAN-HCTZ 50-12.5 MG TA: 50-12.5 | 30 days supply | Qty: 60 | Fill #1

## 2017-02-11 MED FILL — ATORVASTATIN 40 MG TABLET: 40 | 30 days supply | Qty: 30 | Fill #1 | Status: TO

## 2017-04-24 ENCOUNTER — Encounter: Payer: Self-pay | Admitting: Family Medicine

## 2017-05-23 ENCOUNTER — Encounter: Payer: Self-pay | Admitting: Family Medicine

## 2017-05-23 ENCOUNTER — Ambulatory Visit: Payer: Medicare Other | Attending: Family Medicine | Admitting: Family Medicine

## 2017-05-23 VITALS — BP 163/80 | HR 89 | Temp 97.8°F | Wt 397.2 lb

## 2017-05-23 DIAGNOSIS — Z86711 Personal history of pulmonary embolism: Secondary | ICD-10-CM | POA: Diagnosis not present

## 2017-05-23 DIAGNOSIS — M150 Primary generalized (osteo)arthritis: Secondary | ICD-10-CM

## 2017-05-23 DIAGNOSIS — M1990 Unspecified osteoarthritis, unspecified site: Secondary | ICD-10-CM | POA: Diagnosis not present

## 2017-05-23 DIAGNOSIS — K219 Gastro-esophageal reflux disease without esophagitis: Secondary | ICD-10-CM | POA: Diagnosis not present

## 2017-05-23 DIAGNOSIS — G4733 Obstructive sleep apnea (adult) (pediatric): Secondary | ICD-10-CM | POA: Insufficient documentation

## 2017-05-23 DIAGNOSIS — M159 Polyosteoarthritis, unspecified: Secondary | ICD-10-CM

## 2017-05-23 DIAGNOSIS — G8929 Other chronic pain: Secondary | ICD-10-CM | POA: Insufficient documentation

## 2017-05-23 DIAGNOSIS — Z79899 Other long term (current) drug therapy: Secondary | ICD-10-CM | POA: Insufficient documentation

## 2017-05-23 DIAGNOSIS — E119 Type 2 diabetes mellitus without complications: Secondary | ICD-10-CM | POA: Diagnosis not present

## 2017-05-23 DIAGNOSIS — E785 Hyperlipidemia, unspecified: Secondary | ICD-10-CM | POA: Diagnosis not present

## 2017-05-23 DIAGNOSIS — Z6841 Body Mass Index (BMI) 40.0 and over, adult: Secondary | ICD-10-CM | POA: Insufficient documentation

## 2017-05-23 DIAGNOSIS — K449 Diaphragmatic hernia without obstruction or gangrene: Secondary | ICD-10-CM | POA: Insufficient documentation

## 2017-05-23 DIAGNOSIS — Z7984 Long term (current) use of oral hypoglycemic drugs: Secondary | ICD-10-CM | POA: Diagnosis not present

## 2017-05-23 DIAGNOSIS — R0602 Shortness of breath: Secondary | ICD-10-CM | POA: Insufficient documentation

## 2017-05-23 DIAGNOSIS — I10 Essential (primary) hypertension: Secondary | ICD-10-CM | POA: Insufficient documentation

## 2017-05-23 DIAGNOSIS — D509 Iron deficiency anemia, unspecified: Secondary | ICD-10-CM | POA: Insufficient documentation

## 2017-05-23 DIAGNOSIS — R5383 Other fatigue: Secondary | ICD-10-CM | POA: Insufficient documentation

## 2017-05-23 LAB — HEMOCCULT GUIAC POC 1CARD (OFFICE): FECAL OCCULT BLD: NEGATIVE

## 2017-05-23 LAB — GLUCOSE, POCT (MANUAL RESULT ENTRY): POC Glucose: 104 mg/dl — AB (ref 70–99)

## 2017-05-23 LAB — POCT GLYCOSYLATED HEMOGLOBIN (HGB A1C): Hemoglobin A1C: 6.5

## 2017-05-23 MED ORDER — HUGO ROLLING WALKER BASIC MISC: 1.0000 | Freq: Every day | 0 refills | Status: DC

## 2017-05-23 MED ORDER — TRAMADOL HCL 50 MG PO TABS: 50.0000 mg | ORAL_TABLET | Freq: Two times a day (BID) | ORAL | 0 refills | Status: DC | PRN

## 2017-05-23 MED ORDER — LOSARTAN POTASSIUM-HCTZ 50-12.5 MG PO TABS: 2.0000 | ORAL_TABLET | Freq: Every day | ORAL | 3 refills | Status: DC

## 2017-05-23 MED ORDER — HUGO ROLLING WALKER BASIC MISC
0 refills | Status: AC
Start: 2017-05-23 — End: ?

## 2017-05-23 NOTE — Patient Instructions (Addendum)
Gara was seen today for fatigue.  Diagnoses and all orders for this visit:  Controlled type 2 diabetes mellitus without complication, without long-term current use of insulin (HCC) -     POCT glucose (manual entry) -     POCT glycosylated hemoglobin (Hb A1C)  Hiatal hernia -     CBC -     POCT occult blood stool  Iron deficiency anemia, unspecified iron deficiency anemia type -     CBC -     Iron and TIBC -     Ferritin -     POCT occult blood stool  Primary osteoarthritis involving multiple joints -     Discontinue: Misc. Devices ([place]) MISC; 1 each by Does not apply route daily. -     Drug Screen 8 w/Conf, Ur -     Misc. Devices ([place]) MISC; Gilford Rile with seat -     traMADol (ULTRAM) 50 MG tablet; Take 1 tablet (50 mg total) by mouth every 12 (twelve) hours as needed for moderate pain.  Hypertension, unspecified type -     losartan-hydrochlorothiazide (HYZAAR) 50-12.5 MG tablet; Take 2 tablets by mouth daily. -     BMP8+EGFR   F/u in 3-4 weeks for osteoarthrits pain follow up response to tramadol   Dr. Adrian Blackwater

## 2017-05-23 NOTE — Assessment & Plan Note (Signed)
Erosive hiatal hernia noted on EGD 03/05/2014 Patient is to take iron supplement indefinitely

## 2017-05-23 NOTE — Progress Notes (Signed)
Pt is weak and tired.

## 2017-05-23 NOTE — Assessment & Plan Note (Signed)
A: chronic pain in back and knees due to OA complicated by severe morbid obesity. Diclofenac has not helped. She has hiatal hernia with erosions, so will not try another NSAID  P: Tramadol iniatated She signed controlled substance contract UDS obtained reviewed [place] controlled substance reporting system database, no prescriptions

## 2017-05-23 NOTE — Progress Notes (Signed)
Subjective:  Patient ID: Brenda Clark, female    DOB: 04/04/1951  Age: 66 y.o. MRN: [id number]  CC: Fatigue   HPI Brenda Clark has hx of DVT/PE, morbid obesity, obesity hypoventilation on CPAP, HTN, DM, osteoarthritis, iron deficiency anemia Brenda Clark  presents for    1.  IDA related due large hiatal hernia with Lysbeth Galas erosions diagnosed 03/05/14: non-complaint with twice daily iron. Brenda Clark reports Brenda Clark ran out about 2 weeks ago. Brenda Clark reports SOB and fatigue for 3 eweks. No dizziness or lightheadedness.    2. Arthritis: Brenda Clark is taking diclofenac. Brenda Clark reports it does not help. Pain is level is 10/10. Mostly in back and legs. Brenda Clark request stronger pain medicine. Brenda Clark denies smoking, ETOH and illicit drug use. Brenda Clark also request prescription for seated rolling walker.  3. HTN: taking hyzaar. Has some leg swelling. Fatigue and SOB. No chest pain.  4. DM2: taking metformin. No GI upset. No polyuria or polydipsia.   Social History  Substance Use Topics  . Smoking status: Never Smoker  . Smokeless tobacco: Never Used  . Alcohol use No   Past Medical History:  Diagnosis Date  . Arthritis    DDD and HNP on MRI in 2008  . 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 Chronic pain   . Endometrial polyp 08/2010   biopsied.   Marland Kitchen GERD (gastroesophageal reflux disease)   . Headache, migraine   . Hiatal hernia 03/05/2014  . Hyperlipidemia   . Iron deficiency anemia, unspecified    ice pica  . Migraine, unspecified, without mention of intractable migraine without mention of status migrainosus   . Morbid obesity ([place])   . OSA (obstructive sleep apnea)   . Osteoarthrosis, unspecified whether generalized or localized, lower leg   . Other and unspecified hyperlipidemia   . Pulmonary embolus ([place]) 2012  . Retinal detachment   . Type II or unspecified type diabetes mellitus without mention of complication, not stated as uncontrolled   . Unspecified essential hypertension    Outpatient Medications Prior to Visit  Medication  Sig Dispense Refill  . ACCU-CHEK SOFTCLIX LANCETS lancets 1 each as needed. Reported on 01/19/2016    . acetaminophen (TYLENOL) 500 MG tablet Take 2 tablets (1,000 mg total) by mouth every 8 (eight) hours as needed. 180 tablet 5  . Blood Glucose Monitoring Suppl (ACCU-CHEK AVIVA PLUS) w/Device KIT 1 Device by Does not apply route 2 (two) times daily. 1 kit 0  . diclofenac (CATAFLAM) 50 MG tablet Take 1 tablet (50 mg total) by mouth 2 (two) times daily with a meal. 60 tablet 2  . ferrous sulfate 325 (65 FE) MG tablet Take 1 tablet (325 mg total) by mouth 2 (two) times daily with a meal. 180 tablet 3  . glucose blood (ACCU-CHEK AVIVA PLUS) test strip 1 each by Other route 2 (two) times daily. 100 each 12  . losartan-hydrochlorothiazide (HYZAAR) 50-12.5 MG tablet Take 2 tablets by mouth daily. 60 tablet 11  . metFORMIN (GLUCOPHAGE-XR) 500 MG 24 hr tablet Take 1 tablet (500 mg total) by mouth daily with breakfast. 90 tablet 3  . omeprazole (PRILOSEC) 20 MG capsule Take 2 capsules (40 mg total) by mouth daily. 60 capsule 11  . atorvastatin (LIPITOR) 40 MG tablet Take 1 tablet (40 mg total) by mouth daily. (Patient not taking: Reported on 05/23/2017) 30 tablet 11  . nitroGLYCERIN (NITROSTAT) 0.4 MG SL tablet Place 0.4 mg under the tongue every 5 (five) minutes as needed. Reported on 01/19/2016  No facility-administered medications prior to visit.     ROS Review of Systems  Constitutional: Negative for chills and fever.  Eyes: Negative for visual disturbance.  Respiratory: Positive for shortness of breath.   Cardiovascular: Negative for chest pain.  Gastrointestinal: Negative for abdominal pain and blood in stool.  Musculoskeletal: Positive for arthralgias, back pain and gait problem.  Skin: Negative for rash.  Allergic/Immunologic: Negative for immunocompromised state.  Hematological: Negative for adenopathy. Does not bruise/bleed easily.  Psychiatric/Behavioral: Positive for sleep disturbance.  Negative for dysphoric mood and suicidal ideas.    Objective:  BP (!) 163/80   Pulse 89   Temp 97.8 F (36.6 C) (Oral)   Wt (!) 397 lb 3.2 oz (180.2 kg)   SpO2 98%   BMI 68.18 kg/m   BP/Weight 05/23/2017 11/20/2016 03/70/4888  Systolic BP 916 945 038  Diastolic BP 80 77 88  Wt. (Lbs) 397.2 391 384  BMI 68.18 67.11 65.91   Physical Exam  Constitutional: Brenda Clark is oriented to person, place, and time. Brenda Clark appears well-developed and well-nourished. No distress.  Morbidly obese   HENT:  Head: Normocephalic and atraumatic.  Eyes:    Cardiovascular: Normal rate, regular rhythm, normal heart sounds and intact distal pulses.   Pulmonary/Chest: Effort normal and breath sounds normal.  Musculoskeletal: Brenda Clark exhibits edema (in ankles and feet ).  Neurological: Brenda Clark is alert and oriented to person, place, and time.  Skin: Skin is warm and dry. No rash noted.  Psychiatric: Brenda Clark has a normal mood and affect.   Lab Results  Component Value Date   HGBA1C 6.3 10/23/2016   Lab Results  Component Value Date   HGBA1C 6.5 05/23/2017    CBG 104   Chemistry      Component Value Date/Time   NA 143 05/23/2017 1658   K 4.6 05/23/2017 1658   CL 105 05/23/2017 1658   CO2 20 05/23/2017 1658   BUN 12 05/23/2017 1658   CREATININE 0.63 05/23/2017 1658   CREATININE 0.65 10/23/2016 1154      Component Value Date/Time   CALCIUM 9.5 05/23/2017 1658   ALKPHOS 83 01/19/2016 1232   AST 12 01/19/2016 1232   ALT 8 01/19/2016 1232   BILITOT 0.3 01/19/2016 1232      CBC    Component Value Date/Time   WBC 7.5 05/23/2017 1658   WBC 6.2 10/23/2016 1123   RBC 4.26 05/23/2017 1658   RBC 4.25 10/23/2016 1123   HGB 9.6 (L) 05/23/2017 1658   HCT 32.1 (L) 05/23/2017 1658   PLT 391 (H) 05/23/2017 1658   MCV 75 (L) 05/23/2017 1658   MCH 22.5 (L) 05/23/2017 1658   MCH 18.1 (L) 10/23/2016 1123   MCHC 29.9 (L) 05/23/2017 1658   MCHC 26.6 (L) 10/23/2016 1123   RDW 16.0 (H) 05/23/2017 1658   LYMPHSABS  1.2 01/19/2016 1232   MONOABS 0.4 01/19/2016 1232   EOSABS 0.0 01/19/2016 1232   BASOSABS 0.0 01/19/2016 1232   Depression screen PHQ 2/9 05/23/2017 11/20/2016 10/23/2016  Decreased Interest 0 0 0  Down, Depressed, Hopeless 0 0 0  PHQ - 2 Score 0 0 0  Altered sleeping 0 1 2  Tired, decreased energy _0 Change in appetite 1 0 2  Feeling bad or failure about yourself  0 0 0  Trouble concentrating 0 0 0  Moving slowly or fidgety/restless 3 0 0  Suicidal thoughts 0 0 0  PHQ-9 Score _1 GAD 7 :  Generalized Anxiety Score 05/23/2017 11/20/2016 10/23/2016 01/19/2016  Nervous, Anxious, on Edge 0 0 0 1  Control/stop worrying 0 0 0 0  Worry too much - different things 0 0 0 0  Trouble relaxing 0 0 0 0  Restless 0 0 0 0  Easily annoyed or irritable 0 0 0 1  Afraid - awful might happen 0 0 0 -  Total GAD 7 Score 0 0 0 -     Assessment & Plan:   Brenda Clark was seen today for fatigue.  Diagnoses and all orders for this visit:  Controlled type 2 diabetes mellitus without complication, without long-term current use of insulin (HCC) -     POCT glucose (manual entry) -     POCT glycosylated hemoglobin (Hb A1C)  Hiatal hernia -     CBC -     POCT occult blood stool  Iron deficiency anemia, unspecified iron deficiency anemia type -     CBC -     Iron and TIBC -     Ferritin -     POCT occult blood stool  Primary osteoarthritis involving multiple joints -     Discontinue: Misc. Devices ([place]) MISC; 1 each by Does not apply route daily. -     Drug Screen 8 w/Conf, Ur -     Misc. Devices ([place]) MISC; Gilford Rile with seat -     traMADol (ULTRAM) 50 MG tablet; Take 1 tablet (50 mg total) by mouth every 12 (twelve) hours as needed for moderate pain.  Hypertension, unspecified type -     losartan-hydrochlorothiazide (HYZAAR) 50-12.5 MG tablet; Take 2 tablets by mouth daily. -     BMP8+EGFR    No orders of the defined types were placed in this  encounter.   Follow-up: Return in about 4 weeks (around 06/20/2017) for HTN and chronic pain f/u tramadol.   Boykin Nearing MD

## 2017-05-24 ENCOUNTER — Other Ambulatory Visit: Payer: Self-pay | Admitting: Family Medicine

## 2017-05-24 DIAGNOSIS — M159 Polyosteoarthritis, unspecified: Secondary | ICD-10-CM

## 2017-05-24 DIAGNOSIS — M150 Primary generalized (osteo)arthritis: Principal | ICD-10-CM

## 2017-05-24 LAB — BMP8+EGFR
BUN/Creatinine Ratio: 19 (ref 12–28)
BUN: 12 mg/dL (ref 8–27)
CALCIUM: 9.5 mg/dL (ref 8.7–10.3)
CHLORIDE: 105 mmol/L (ref 96–106)
CO2: 20 mmol/L (ref 20–29)
Creatinine, Ser: 0.63 mg/dL (ref 0.57–1.00)
GFR calc Af Amer: 107 mL/min/{1.73_m2} (ref >59)
GFR calc non Af Amer: 92 mL/min/{1.73_m2} (ref >59)
Glucose: 108 mg/dL — ABNORMAL HIGH (ref 65–99)
POTASSIUM: 4.6 mmol/L (ref 3.5–5.2)
Sodium: 143 mmol/L (ref 134–144)

## 2017-05-24 LAB — CBC
Hematocrit: 32.1 % — ABNORMAL LOW (ref 34.0–46.6)
Hemoglobin: 9.6 g/dL — ABNORMAL LOW (ref 11.1–15.9)
MCH: 22.5 pg — ABNORMAL LOW (ref 26.6–33.0)
MCHC: 29.9 g/dL — ABNORMAL LOW (ref 31.5–35.7)
MCV: 75 fL — ABNORMAL LOW (ref 79–97)
PLATELETS: 391 10*3/uL — AB (ref 150–379)
RBC: 4.26 x10E6/uL (ref 3.77–5.28)
RDW: 16 % — AB (ref 12.3–15.4)
WBC: 7.5 10*3/uL (ref 3.4–10.8)

## 2017-05-24 LAB — DRUG SCREEN 8 W/CONF, UR
Amphetamines, Urine: NEGATIVE ng/mL
BENZODIAZ UR QL: NEGATIVE ng/mL
BUPRENORPHINE, URINE: NEGATIVE ng/mL
Barbiturate screen, urine: NEGATIVE ng/mL
Cannabinoid Quant, Ur: NEGATIVE ng/mL
Cocaine (Metab.): NEGATIVE ng/mL
METHADONE SCREEN, URINE: NEGATIVE ng/mL
OPIATE SCREEN URINE: NEGATIVE ng/mL

## 2017-05-24 LAB — FERRITIN: Ferritin: 8 ng/mL — ABNORMAL LOW (ref 15–150)

## 2017-05-24 LAB — IRON AND TIBC
IRON SATURATION: 4 % — AB (ref 15–55)
IRON: 17 ug/dL — AB (ref 27–139)
TIBC: 440 ug/dL (ref 250–450)
UIBC: 423 ug/dL — AB (ref 118–369)

## 2017-05-24 MED FILL — OMEPRAZOLE DR 20 MG CAPSULE: 20 | 30 days supply | Qty: 60 | Fill #2 | Status: TO

## 2017-05-24 MED FILL — DICLOFENAC POT 50 MG TABLET: 50 | 30 days supply | Qty: 60 | Fill #2

## 2017-05-24 MED FILL — METFORMIN HCL ER 500 MG TAB: 500 | 30 days supply | Qty: 30 | Fill #2 | Status: TO

## 2017-05-24 MED FILL — LOSARTAN-HCTZ 50-12.5 MG TA: 50-12.5 | 30 days supply | Qty: 60 | Fill #0 | Status: TO

## 2017-05-24 NOTE — Assessment & Plan Note (Signed)
Anemia has improved overall, but iron levels are low  Iron stores are very low  [facility] Please call and fax order, please also inform patient of labs and appointment Phone [phone] Fax [phone]  feraheme 510 mg IV x 2

## 2017-05-25 NOTE — Assessment & Plan Note (Signed)
Remain well controlled  Continue metformin

## 2017-05-25 NOTE — Assessment & Plan Note (Addendum)
A: hypertensive  Med: compliant P: Refilled hyzaar 100-25 mg daily Close f/u for recheck  Weight loss encouraged

## 2017-05-27 MED FILL — traMADol HCL 50 MG TABS: 50 | 30 days supply | Qty: 60 | Fill #0

## 2017-05-29 MED FILL — FERROUS SULFATE 325 MG TAB: 325 (65 FE) | 30 days supply | Qty: 60 | Fill #1 | Status: TO

## 2017-06-21 ENCOUNTER — Encounter: Payer: Self-pay | Admitting: Family Medicine

## 2017-06-21 ENCOUNTER — Ambulatory Visit: Payer: Medicare Other | Attending: Family Medicine | Admitting: Family Medicine

## 2017-06-21 VITALS — BP 157/80 | HR 80 | Temp 98.0°F | Ht 64.0 in | Wt >= 6400 oz

## 2017-06-21 DIAGNOSIS — M150 Primary generalized (osteo)arthritis: Secondary | ICD-10-CM | POA: Diagnosis not present

## 2017-06-21 DIAGNOSIS — D509 Iron deficiency anemia, unspecified: Secondary | ICD-10-CM

## 2017-06-21 DIAGNOSIS — Z86718 Personal history of other venous thrombosis and embolism: Secondary | ICD-10-CM | POA: Diagnosis not present

## 2017-06-21 DIAGNOSIS — I10 Essential (primary) hypertension: Secondary | ICD-10-CM | POA: Diagnosis present

## 2017-06-21 DIAGNOSIS — Z7984 Long term (current) use of oral hypoglycemic drugs: Secondary | ICD-10-CM | POA: Insufficient documentation

## 2017-06-21 DIAGNOSIS — M1991 Primary osteoarthritis, unspecified site: Secondary | ICD-10-CM | POA: Insufficient documentation

## 2017-06-21 DIAGNOSIS — M1990 Unspecified osteoarthritis, unspecified site: Secondary | ICD-10-CM | POA: Insufficient documentation

## 2017-06-21 DIAGNOSIS — E119 Type 2 diabetes mellitus without complications: Secondary | ICD-10-CM

## 2017-06-21 DIAGNOSIS — Z79899 Other long term (current) drug therapy: Secondary | ICD-10-CM | POA: Insufficient documentation

## 2017-06-21 DIAGNOSIS — Z79891 Long term (current) use of opiate analgesic: Secondary | ICD-10-CM | POA: Diagnosis not present

## 2017-06-21 DIAGNOSIS — Z86711 Personal history of pulmonary embolism: Secondary | ICD-10-CM | POA: Diagnosis not present

## 2017-06-21 DIAGNOSIS — M159 Polyosteoarthritis, unspecified: Secondary | ICD-10-CM

## 2017-06-21 LAB — GLUCOSE, POCT (MANUAL RESULT ENTRY): POC Glucose: 111 mg/dl — AB (ref 70–99)

## 2017-06-21 MED ORDER — TRAMADOL HCL 50 MG PO TABS: 50.0000 mg | ORAL_TABLET | Freq: Three times a day (TID) | ORAL | 2 refills | Status: AC

## 2017-06-21 MED FILL — traMADol HCL 50 MG TABS: 50 | 30 days supply | Qty: 90 | Fill #0 | Status: TO

## 2017-06-21 NOTE — Progress Notes (Signed)
Subjective:  Patient ID: Brenda Clark, female    DOB: 06/21/1951  Age: 66 y.o. MRN: [id number]  CC: Follow-up   HPI Brenda Clark has hx of DVT/PE, morbid obesity, obesity hypoventilation on CPAP, HTN, DM, osteoarthritis, iron deficiency anemia she  presents for    1.  IDA related due large hiatal hernia with Lysbeth Galas erosions diagnosed 03/05/14: complaint with twice daily iron.  She has not yet had scheduled IV iron infusion.  She reports SOB and fatigue. No shortness of breath or lightheadedness.   2. Arthritis: she is taking tramadol 50 mg BID. She reports pain level is down from 10/10 to 9/10. Mostly in back and legs. She denies nausea, abdominal pain and constipation. She denies smoking, ETOH and illicit drug use. She also request prescription for seated rolling walker. She did receive a rolling walker but reports it is too small to use.    Social History  Substance Use Topics  . Smoking status: Never Smoker  . Smokeless tobacco: Never Used  . Alcohol use No   Past Medical History:  Diagnosis Date  . Arthritis    DDD and HNP on MRI in 2008  . 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 Chronic pain   . Endometrial polyp 08/2010   biopsied.   Marland Kitchen GERD (gastroesophageal reflux disease)   . Headache, migraine   . Hiatal hernia 03/05/2014  . Hyperlipidemia   . Iron deficiency anemia, unspecified    ice pica  . Migraine, unspecified, without mention of intractable migraine without mention of status migrainosus   . Morbid obesity ([place])   . OSA (obstructive sleep apnea)   . Osteoarthrosis, unspecified whether generalized or localized, lower leg   . Other and unspecified hyperlipidemia   . Pulmonary embolus ([place]) 2012  . Retinal detachment   . Type II or unspecified type diabetes mellitus without mention of complication, not stated as uncontrolled   . Unspecified essential hypertension    Outpatient Medications Prior to Visit  Medication Sig Dispense Refill  . ACCU-CHEK SOFTCLIX LANCETS  lancets 1 each as needed. Reported on 01/19/2016    . acetaminophen (TYLENOL) 500 MG tablet Take 2 tablets (1,000 mg total) by mouth every 8 (eight) hours as needed. 180 tablet 5  . Blood Glucose Monitoring Suppl (ACCU-CHEK AVIVA PLUS) w/Device KIT 1 Device by Does not apply route 2 (two) times daily. 1 kit 0  . ferrous sulfate 325 (65 FE) MG tablet Take 1 tablet (325 mg total) by mouth 2 (two) times daily with a meal. 180 tablet 3  . glucose blood (ACCU-CHEK AVIVA PLUS) test strip 1 each by Other route 2 (two) times daily. 100 each 12  . losartan-hydrochlorothiazide (HYZAAR) 50-12.5 MG tablet Take 2 tablets by mouth daily. 180 tablet 3  . metFORMIN (GLUCOPHAGE-XR) 500 MG 24 hr tablet Take 1 tablet (500 mg total) by mouth daily with breakfast. 90 tablet 3  . Misc. Devices ([place]) MISC Walker with seat 1 each 0  . nitroGLYCERIN (NITROSTAT) 0.4 MG SL tablet Place 0.4 mg under the tongue every 5 (five) minutes as needed. Reported on 01/19/2016    . omeprazole (PRILOSEC) 20 MG capsule Take 2 capsules (40 mg total) by mouth daily. 60 capsule 11  . traMADol (ULTRAM) 50 MG tablet Take 1 tablet (50 mg total) by mouth every 12 (twelve) hours as needed for moderate pain. 60 tablet 0  . atorvastatin (LIPITOR) 40 MG tablet Take 1 tablet (40 mg total) by mouth daily. (  Patient not taking: Reported on 05/23/2017) 30 tablet 11   No facility-administered medications prior to visit.     ROS Review of Systems  Constitutional: Positive for fatigue. Negative for chills and fever.  Eyes: Negative for visual disturbance.  Respiratory: Negative for shortness of breath.   Cardiovascular: Negative for chest pain.  Gastrointestinal: Negative for abdominal pain and blood in stool.  Musculoskeletal: Positive for arthralgias, back pain and gait problem.  Skin: Negative for rash.  Allergic/Immunologic: Negative for immunocompromised state.  Hematological: Negative for adenopathy. Does not bruise/bleed  easily.  Psychiatric/Behavioral: Positive for sleep disturbance. Negative for dysphoric mood and suicidal ideas.    Objective:  BP (!) 157/80   Pulse 80   Temp 98 F (36.7 C) (Oral)   Ht '5\' 4"'  (1.626 m)   Wt (!) 402 lb (182.3 kg)   SpO2 99%   BMI 69.00 kg/m   BP/Weight 06/21/2017 05/23/2017 03/49/1791  Systolic BP 505 697 948  Diastolic BP 80 80 77  Wt. (Lbs) 402 397.2 391  BMI 69 68.18 67.11   Physical Exam  Constitutional: She is oriented to person, place, and time. She appears well-developed and well-nourished. No distress.  Morbidly obese   HENT:  Head: Normocephalic and atraumatic.  Cardiovascular: Normal rate, regular rhythm, normal heart sounds and intact distal pulses.   Pulmonary/Chest: Effort normal and breath sounds normal.  Musculoskeletal: She exhibits edema (in ankles and feet ).  Neurological: She is alert and oriented to person, place, and time.  Skin: Skin is warm and dry. No rash noted.  Psychiatric: She has a normal mood and affect.   Lab Results  Component Value Date   HGBA1C 6.5 05/23/2017   CBG 111   Chemistry      Component Value Date/Time   NA 143 05/23/2017 1658   K 4.6 05/23/2017 1658   CL 105 05/23/2017 1658   CO2 20 05/23/2017 1658   BUN 12 05/23/2017 1658   CREATININE 0.63 05/23/2017 1658   CREATININE 0.65 10/23/2016 1154      Component Value Date/Time   CALCIUM 9.5 05/23/2017 1658   ALKPHOS 83 01/19/2016 1232   AST 12 01/19/2016 1232   ALT 8 01/19/2016 1232   BILITOT 0.3 01/19/2016 1232      CBC    Component Value Date/Time   WBC 7.5 05/23/2017 1658   WBC 6.2 10/23/2016 1123   RBC 4.26 05/23/2017 1658   RBC 4.25 10/23/2016 1123   HGB 9.6 (L) 05/23/2017 1658   HCT 32.1 (L) 05/23/2017 1658   PLT 391 (H) 05/23/2017 1658   MCV 75 (L) 05/23/2017 1658   MCH 22.5 (L) 05/23/2017 1658   MCH 18.1 (L) 10/23/2016 1123   MCHC 29.9 (L) 05/23/2017 1658   MCHC 26.6 (L) 10/23/2016 1123   RDW 16.0 (H) 05/23/2017 1658   LYMPHSABS 1.2  01/19/2016 1232   MONOABS 0.4 01/19/2016 1232   EOSABS 0.0 01/19/2016 1232   BASOSABS 0.0 01/19/2016 1232   Depression screen PHQ 2/9 05/23/2017 11/20/2016 10/23/2016  Decreased Interest 0 0 0  Down, Depressed, Hopeless 0 0 0  PHQ - 2 Score 0 0 0  Altered sleeping 0 1 2  Tired, decreased energy '3 1 3  ' Change in appetite 1 0 2  Feeling bad or failure about yourself  0 0 0  Trouble concentrating 0 0 0  Moving slowly or fidgety/restless 3 0 0  Suicidal thoughts 0 0 0  PHQ-9 Score '7 2 7   ' GAD 7 :  Generalized Anxiety Score 05/23/2017 11/20/2016 10/23/2016 01/19/2016  Nervous, Anxious, on Edge 0 0 0 1  Control/stop worrying 0 0 0 0  Worry too much - different things 0 0 0 0  Trouble relaxing 0 0 0 0  Restless 0 0 0 0  Easily annoyed or irritable 0 0 0 1  Afraid - awful might happen 0 0 0 -  Total GAD 7 Score 0 0 0 -     Assessment & Plan:   Kijana was seen today for follow-up.  Diagnoses and all orders for this visit:  Controlled type 2 diabetes mellitus without complication, without long-term current use of insulin (HCC) -     POCT glucose (manual entry)  Primary osteoarthritis involving multiple joints -     traMADol (ULTRAM) 50 MG tablet; Take 1 tablet (50 mg total) by mouth 3 (three) times daily.  Iron deficiency anemia, unspecified iron deficiency anemia type    No orders of the defined types were placed in this encounter.   Follow-up: Return in about 8 weeks (around 08/16/2017) for recheck iron stores.   Boykin Nearing MD

## 2017-06-21 NOTE — Assessment & Plan Note (Signed)
OA exacerbated by morbid obesity Tramadol increase to 50 mg TID

## 2017-06-21 NOTE — Patient Instructions (Addendum)
Brenda Clark was seen today for follow-up.  Diagnoses and all orders for this visit:  Controlled type 2 diabetes mellitus without complication, without long-term current use of insulin (HCC) -     POCT glucose (manual entry)  Primary osteoarthritis involving multiple joints -     traMADol (ULTRAM) 50 MG tablet; Take 1 tablet (50 mg total) by mouth 3 (three) times daily.  please drop off new prescription for rolling walker at [facility]  Iron Infussion 07/01/17@9 :00- [facility].  F/u in 8 weeks for recheck of iron studies   Dr. Armen PickupFunches

## 2017-06-21 NOTE — Assessment & Plan Note (Signed)
IV iron infusion ordered today

## 2017-06-24 ENCOUNTER — Emergency Department (HOSPITAL_COMMUNITY): Payer: Medicare Other

## 2017-06-24 ENCOUNTER — Encounter (HOSPITAL_COMMUNITY): Payer: Self-pay | Admitting: *Deleted

## 2017-06-24 ENCOUNTER — Observation Stay (HOSPITAL_COMMUNITY)
Admission: EM | Admit: 2017-06-24 | Discharge: 2017-06-26 | Disposition: A | Discharge status: Home / Self Care | Payer: Medicare Other | Attending: Internal Medicine | Admitting: Internal Medicine

## 2017-06-24 ENCOUNTER — Telehealth: Payer: Self-pay | Admitting: Family Medicine

## 2017-06-24 DIAGNOSIS — M1990 Unspecified osteoarthritis, unspecified site: Secondary | ICD-10-CM | POA: Insufficient documentation

## 2017-06-24 DIAGNOSIS — D509 Iron deficiency anemia, unspecified: Secondary | ICD-10-CM | POA: Insufficient documentation

## 2017-06-24 DIAGNOSIS — E118 Type 2 diabetes mellitus with unspecified complications: Secondary | ICD-10-CM | POA: Insufficient documentation

## 2017-06-24 DIAGNOSIS — Z7984 Long term (current) use of oral hypoglycemic drugs: Secondary | ICD-10-CM | POA: Diagnosis not present

## 2017-06-24 DIAGNOSIS — K219 Gastro-esophageal reflux disease without esophagitis: Secondary | ICD-10-CM | POA: Diagnosis not present

## 2017-06-24 DIAGNOSIS — E7800 Pure hypercholesterolemia, unspecified: Secondary | ICD-10-CM

## 2017-06-24 DIAGNOSIS — Z79899 Other long term (current) drug therapy: Secondary | ICD-10-CM | POA: Insufficient documentation

## 2017-06-24 DIAGNOSIS — I509 Heart failure, unspecified: Secondary | ICD-10-CM

## 2017-06-24 DIAGNOSIS — I5033 Acute on chronic diastolic (congestive) heart failure: Secondary | ICD-10-CM | POA: Insufficient documentation

## 2017-06-24 DIAGNOSIS — E662 Morbid (severe) obesity with alveolar hypoventilation: Secondary | ICD-10-CM | POA: Insufficient documentation

## 2017-06-24 DIAGNOSIS — Z6841 Body Mass Index (BMI) 40.0 and over, adult: Secondary | ICD-10-CM | POA: Diagnosis not present

## 2017-06-24 DIAGNOSIS — E785 Hyperlipidemia, unspecified: Secondary | ICD-10-CM | POA: Diagnosis not present

## 2017-06-24 DIAGNOSIS — R0609 Other forms of dyspnea: Secondary | ICD-10-CM | POA: Diagnosis not present

## 2017-06-24 DIAGNOSIS — R0600 Dyspnea, unspecified: Secondary | ICD-10-CM | POA: Diagnosis not present

## 2017-06-24 DIAGNOSIS — Z86711 Personal history of pulmonary embolism: Secondary | ICD-10-CM | POA: Diagnosis not present

## 2017-06-24 DIAGNOSIS — I5032 Chronic diastolic (congestive) heart failure: Secondary | ICD-10-CM

## 2017-06-24 DIAGNOSIS — I110 Hypertensive heart disease with heart failure: Secondary | ICD-10-CM | POA: Diagnosis not present

## 2017-06-24 DIAGNOSIS — R0602 Shortness of breath: Secondary | ICD-10-CM | POA: Diagnosis present

## 2017-06-24 DIAGNOSIS — I10 Essential (primary) hypertension: Secondary | ICD-10-CM

## 2017-06-24 DIAGNOSIS — E119 Type 2 diabetes mellitus without complications: Secondary | ICD-10-CM

## 2017-06-24 LAB — COMPREHENSIVE METABOLIC PANEL
ALT: 13 U/L — AB (ref 14–54)
AST: 19 U/L (ref 15–41)
Albumin: 3 g/dL — ABNORMAL LOW (ref 3.5–5.0)
Alkaline Phosphatase: 92 U/L (ref 38–126)
Anion gap: 8 (ref 5–15)
BUN: 5 mg/dL — ABNORMAL LOW (ref 6–20)
CHLORIDE: 105 mmol/L (ref 101–111)
CO2: 26 mmol/L (ref 22–32)
CREATININE: 0.73 mg/dL (ref 0.44–1.00)
Calcium: 8.7 mg/dL — ABNORMAL LOW (ref 8.9–10.3)
Glucose, Bld: 117 mg/dL — ABNORMAL HIGH (ref 65–99)
POTASSIUM: 3.9 mmol/L (ref 3.5–5.1)
SODIUM: 139 mmol/L (ref 135–145)
Total Bilirubin: 0.3 mg/dL (ref 0.3–1.2)
Total Protein: 6.9 g/dL (ref 6.5–8.1)

## 2017-06-24 LAB — I-STAT TROPONIN, ED
TROPONIN I, POC: 0 ng/mL (ref 0.00–0.08)
Troponin i, poc: 0 ng/mL (ref 0.00–0.08)

## 2017-06-24 LAB — CBC WITH DIFFERENTIAL/PLATELET
BASOS PCT: 0 %
Basophils Absolute: 0 10*3/uL (ref 0.0–0.1)
EOS ABS: 0 10*3/uL (ref 0.0–0.7)
Eosinophils Relative: 1 %
HCT: 33.7 % — ABNORMAL LOW (ref 36.0–46.0)
Hemoglobin: 9.6 g/dL — ABNORMAL LOW (ref 12.0–15.0)
LYMPHS ABS: 0.9 10*3/uL (ref 0.7–4.0)
Lymphocytes Relative: 16 %
MCH: 22.3 pg — AB (ref 26.0–34.0)
MCHC: 28.5 g/dL — AB (ref 30.0–36.0)
MCV: 78.4 fL (ref 78.0–100.0)
MONO ABS: 0.5 10*3/uL (ref 0.1–1.0)
MONOS PCT: 8 %
Neutro Abs: 4.3 10*3/uL (ref 1.7–7.7)
Neutrophils Relative %: 75 %
Platelets: 345 10*3/uL (ref 150–400)
RBC: 4.3 MIL/uL (ref 3.87–5.11)
RDW: 20.7 % — AB (ref 11.5–15.5)
WBC: 5.8 10*3/uL (ref 4.0–10.5)

## 2017-06-24 LAB — URINALYSIS, ROUTINE W REFLEX MICROSCOPIC
Bilirubin Urine: NEGATIVE
GLUCOSE, UA: NEGATIVE mg/dL
Hgb urine dipstick: NEGATIVE
KETONES UR: NEGATIVE mg/dL
Nitrite: NEGATIVE
PH: 6 (ref 5.0–8.0)
PROTEIN: NEGATIVE mg/dL
Specific Gravity, Urine: 1.015 (ref 1.005–1.030)

## 2017-06-24 LAB — BRAIN NATRIURETIC PEPTIDE: B NATRIURETIC PEPTIDE 5: 23.3 pg/mL (ref 0.0–100.0)

## 2017-06-24 LAB — TYPE AND SCREEN
ABO/RH(D): AB POS
ANTIBODY SCREEN: NEGATIVE

## 2017-06-24 LAB — POC OCCULT BLOOD, ED: FECAL OCCULT BLD: NEGATIVE

## 2017-06-24 LAB — GLUCOSE, CAPILLARY: Glucose-Capillary: 114 mg/dL — ABNORMAL HIGH (ref 65–99)

## 2017-06-24 MED ORDER — FERROUS SULFATE 325 (65 FE) MG PO TABS
325.0000 mg | ORAL_TABLET | Freq: Two times a day (BID) | ORAL | Status: DC
  Administered 2017-06-24 – 2017-06-26 (×5): 325 mg via ORAL
  Filled 2017-06-24 (×5): qty 1

## 2017-06-24 MED ORDER — SODIUM CHLORIDE 0.9% FLUSH
3.0000 mL | Freq: Two times a day (BID) | INTRAVENOUS | Status: DC
  Administered 2017-06-24 – 2017-06-25 (×4): 3 mL via INTRAVENOUS

## 2017-06-24 MED ORDER — LOSARTAN POTASSIUM-HCTZ 50-12.5 MG PO TABS
2.0000 | ORAL_TABLET | Freq: Every day | ORAL | Status: DC
Start: 2017-06-24 — End: 2017-06-24

## 2017-06-24 MED ORDER — ACETAMINOPHEN 325 MG PO TABS
650.0000 mg | ORAL_TABLET | Freq: Four times a day (QID) | ORAL | Status: DC | PRN
Start: 2017-06-24 — End: 2017-06-26

## 2017-06-24 MED ORDER — SODIUM CHLORIDE 0.9 % IV SOLN: INTRAVENOUS | Status: DC

## 2017-06-24 MED ORDER — TRAZODONE HCL 50 MG PO TABS
25.0000 mg | ORAL_TABLET | Freq: Every evening | ORAL | Status: DC | PRN
  Administered 2017-06-24 – 2017-06-25 (×2): 25 mg via ORAL
  Filled 2017-06-24 (×2): qty 1

## 2017-06-24 MED ORDER — PANTOPRAZOLE SODIUM 40 MG PO TBEC
40.0000 mg | DELAYED_RELEASE_TABLET | Freq: Every day | ORAL | Status: DC
  Administered 2017-06-24 – 2017-06-26 (×3): 40 mg via ORAL
  Filled 2017-06-24 (×3): qty 1

## 2017-06-24 MED ORDER — LOSARTAN POTASSIUM 50 MG PO TABS
50.0000 mg | ORAL_TABLET | Freq: Every day | ORAL | Status: DC
  Administered 2017-06-24 – 2017-06-26 (×3): 50 mg via ORAL
  Filled 2017-06-24 (×3): qty 1

## 2017-06-24 MED ORDER — FUROSEMIDE 10 MG/ML IJ SOLN
40.0000 mg | Freq: Once | INTRAMUSCULAR | Status: AC
  Administered 2017-06-24: 40 mg via INTRAVENOUS
  Filled 2017-06-24: qty 4

## 2017-06-24 MED ORDER — ONDANSETRON HCL 4 MG/2ML IJ SOLN: 4.0000 mg | Freq: Four times a day (QID) | INTRAMUSCULAR | Status: DC | PRN

## 2017-06-24 MED ORDER — ONDANSETRON HCL 4 MG PO TABS: 4.0000 mg | ORAL_TABLET | Freq: Four times a day (QID) | ORAL | Status: DC | PRN

## 2017-06-24 MED ORDER — ALBUTEROL SULFATE (2.5 MG/3ML) 0.083% IN NEBU: 2.5000 mg | INHALATION_SOLUTION | RESPIRATORY_TRACT | Status: DC

## 2017-06-24 MED ORDER — IOPAMIDOL (ISOVUE-370) INJECTION 76%
INTRAVENOUS | Status: AC
  Administered 2017-06-24: 100 mL
  Filled 2017-06-24: qty 100

## 2017-06-24 MED ORDER — ACETAMINOPHEN 650 MG RE SUPP: 650.0000 mg | Freq: Four times a day (QID) | RECTAL | Status: DC | PRN

## 2017-06-24 MED ORDER — HYDROCODONE-ACETAMINOPHEN 5-325 MG PO TABS: 1.0000 | ORAL_TABLET | ORAL | Status: DC | PRN

## 2017-06-24 MED ORDER — ALBUTEROL SULFATE (2.5 MG/3ML) 0.083% IN NEBU
2.5000 mg | INHALATION_SOLUTION | Freq: Four times a day (QID) | RESPIRATORY_TRACT | Status: DC
  Administered 2017-06-24: 2.5 mg via RESPIRATORY_TRACT
  Filled 2017-06-24: qty 3

## 2017-06-24 MED ORDER — TRAMADOL HCL 50 MG PO TABS
50.0000 mg | ORAL_TABLET | Freq: Three times a day (TID) | ORAL | Status: DC
  Administered 2017-06-24 – 2017-06-26 (×7): 50 mg via ORAL
  Filled 2017-06-24 (×7): qty 1

## 2017-06-24 MED ORDER — ENOXAPARIN SODIUM 100 MG/ML ~~LOC~~ SOLN
90.0000 mg | SUBCUTANEOUS | Status: DC
  Administered 2017-06-24 – 2017-06-25 (×2): 90 mg via SUBCUTANEOUS
  Filled 2017-06-24 (×2): qty 1

## 2017-06-24 MED ORDER — ALBUTEROL SULFATE (2.5 MG/3ML) 0.083% IN NEBU
2.5000 mg | INHALATION_SOLUTION | Freq: Three times a day (TID) | RESPIRATORY_TRACT | Status: DC
  Administered 2017-06-24 – 2017-06-26 (×6): 2.5 mg via RESPIRATORY_TRACT
  Filled 2017-06-24 (×6): qty 3

## 2017-06-24 MED ORDER — ALBUTEROL SULFATE (2.5 MG/3ML) 0.083% IN NEBU: 2.5000 mg | INHALATION_SOLUTION | Freq: Four times a day (QID) | RESPIRATORY_TRACT | Status: DC | PRN

## 2017-06-24 MED ORDER — SENNOSIDES-DOCUSATE SODIUM 8.6-50 MG PO TABS: 1.0000 | ORAL_TABLET | Freq: Every evening | ORAL | Status: DC | PRN

## 2017-06-24 MED ORDER — FUROSEMIDE 10 MG/ML IJ SOLN
20.0000 mg | Freq: Once | INTRAMUSCULAR | Status: AC
  Administered 2017-06-25: 20 mg via INTRAVENOUS
  Filled 2017-06-24 (×2): qty 2

## 2017-06-24 MED ORDER — INSULIN ASPART 100 UNIT/ML ~~LOC~~ SOLN
0.0000 [IU] | Freq: Three times a day (TID) | SUBCUTANEOUS | Status: DC
  Administered 2017-06-25 – 2017-06-26 (×5): 1 [IU] via SUBCUTANEOUS

## 2017-06-24 NOTE — ED Triage Notes (Signed)
Pt states sob and weakness x 1 week.  States feels the same as when she was anemic before.

## 2017-06-24 NOTE — ED Notes (Signed)
Attempted Report x2. 

## 2017-06-24 NOTE — Telephone Encounter (Signed)
Per documentation, pt is in the ED

## 2017-06-24 NOTE — ED Provider Notes (Addendum)
[facility] DEPT Provider Note   CSN: [id number] Arrival date & time: 06/24/17  1015     History   Chief Complaint Chief Complaint  Patient presents with  . Shortness of Breath  . Weakness    HPI Brenda Clark is a 66 y.o. female.  HPI   66 year old female who presents with weakness and shortness of breath/dyspnea on exertion for the past week. States that she has similar symptoms when she was anemic due to GI bleed several years ago and required blood transfusion. Has noted that her stools have been darker in appearance over the past week. No nausea, vomiting, abdominal pain. Used to be on warfarin for history of DVT/PE, but was taken off of her warfarin due to GI bleed history. Also history of diabetes, hypertension, and hyperlipidemia. Denies any associating chest pain, fevers or chills, cough, dysuria or urinary frequency.  Past Medical History:  Diagnosis Date  . Arthritis    DDD and HNP on MRI in 2008  . 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 Chronic pain   . Endometrial polyp 08/2010   biopsied.   Marland Kitchen GERD (gastroesophageal reflux disease)   . Headache, migraine   . Hiatal hernia 03/05/2014  . Hyperlipidemia   . Iron deficiency anemia, unspecified    ice pica  . Migraine, unspecified, without mention of intractable migraine without mention of status migrainosus   . Morbid obesity ([place])   . OSA (obstructive sleep apnea)   . Osteoarthrosis, unspecified whether generalized or localized, lower leg   . Other and unspecified hyperlipidemia   . Pulmonary embolus ([place]) 2012  . Retinal detachment   . Type II or unspecified type diabetes mellitus without mention of complication, not stated as uncontrolled   . Unspecified essential hypertension     Patient Active Problem List   Diagnosis Date Noted  . Anemia, iron deficiency 07/22/2014  . HTN (hypertension) 03/16/2014  . Hiatal hernia 03/05/2014  . Diverticulosis of colon without hemorrhage 03/05/2014  . Sleep apnea with use of  continuous positive airway pressure (CPAP) 09/11/2013  . Obesity hypoventilation syndrome ([place]) 09/11/2013  . CARDIOVASCULAR FUNCTION STUDY, ABNORMAL 02/09/2010  . DOE (dyspnea on exertion) 12/30/2009  . Edema 11/08/2009  . CAROTID BRUIT, RIGHT 04/06/2009  . Hyperlipidemia 07/05/2008  . INSOMNIA 01/21/2008  . Diabetes type 2, controlled ([place]) 08/04/2007  . MORBID OBESITY 08/04/2007  . Osteoarthritis 08/04/2007  . MIGRAINE, CHRONIC 10/10/2006    Past Surgical History:  Procedure Laterality Date  . CATARACT EXTRACTION    . COLONOSCOPY WITH PROPOFOL N/A 03/05/2014   Procedure: COLONOSCOPY WITH PROPOFOL;  Surgeon: Gatha Mayer, MD;  Location: [facility];  Service: Endoscopy;  Laterality: N/A;  . ESOPHAGOGASTRODUODENOSCOPY (EGD) WITH PROPOFOL N/A 03/05/2014   Procedure: ESOPHAGOGASTRODUODENOSCOPY (EGD) WITH PROPOFOL;  Surgeon: Gatha Mayer, MD;  Location: [facility];  Service: Endoscopy;  Laterality: N/A;  . LUMBAR [place]    . TUBAL LIGATION      OB History    Gravida Para Term Preterm AB Living   _0 0 1 2   SAB TAB Ectopic Multiple Live Births   1 0 0 0         Home Medications    Prior to Admission medications   Medication Sig Start Date End Date Taking? Authorizing Provider  acetaminophen (TYLENOL) 500 MG tablet Take 2 tablets (1,000 mg total) by mouth every 8 (eight) hours as needed. 10/23/16  Yes Funches, Josalyn, MD  ferrous sulfate 325 (65 FE) MG  tablet Take 1 tablet (325 mg total) by mouth 2 (two) times daily with a meal. 10/23/16  Yes Funches, Josalyn, MD  losartan-hydrochlorothiazide (HYZAAR) 50-12.5 MG tablet Take 2 tablets by mouth daily. 05/23/17  Yes Funches, Adriana Mccallum, MD  metFORMIN (GLUCOPHAGE-XR) 500 MG 24 hr tablet Take 1 tablet (500 mg total) by mouth daily with breakfast. 10/23/16  Yes Funches, Josalyn, MD  omeprazole (PRILOSEC) 20 MG capsule Take 2 capsules (40 mg total) by mouth daily. 11/20/16  Yes Funches, Josalyn, MD  traMADol (ULTRAM) 50 MG  tablet Take 1 tablet (50 mg total) by mouth 3 (three) times daily. 06/21/17  Yes Funches, Josalyn, MD  atorvastatin (LIPITOR) 40 MG tablet Take 1 tablet (40 mg total) by mouth daily. Patient not taking: Reported on 05/23/2017 10/25/16   Boykin Nearing, MD  Blood Glucose Monitoring Suppl (ACCU-CHEK AVIVA PLUS) w/Device KIT 1 Device by Does not apply route 2 (two) times daily. 11/20/16   Funches, Adriana Mccallum, MD  glucose blood (ACCU-CHEK AVIVA PLUS) test strip 1 each by Other route 2 (two) times daily. 11/20/16   Boykin Nearing, MD  Misc. Devices ([place]) [place] Walker with seat 05/23/17   Boykin Nearing, MD  nitroGLYCERIN (NITROSTAT) 0.4 MG SL tablet Place 0.4 mg under the tongue every 5 (five) minutes as needed. Reported on 01/19/2016    [provider]    Family History Family History  Problem Relation Age of Onset  . Colon cancer Father   . Breast cancer Sister   . Diabetes Sister     Social History Social History  Substance Use Topics  . Smoking status: Never Smoker  . Smokeless tobacco: Never Used  . Alcohol use No     Allergies   Patient has no known allergies.   Review of Systems Review of Systems  Constitutional: Negative for fever.  Respiratory: Positive for shortness of breath. Negative for cough.   Cardiovascular: Negative for chest pain.  Gastrointestinal: Negative for abdominal pain.  All other systems reviewed and are negative.    Physical Exam Updated Vital Signs BP (!) 163/78 (BP Location: Right Arm)   Pulse 77   Temp 97.9 F (36.6 C) (Oral)   Resp (!) 21   Ht 5' 4" (1.626 m)   Wt (!) 182.3 kg (402 lb)   SpO2 99%   BMI 69.00 kg/m   Physical Exam Physical Exam  Nursing note and vitals reviewed. Constitutional: Well developed, well nourished, non-toxic, and in no acute distress Head: Normocephalic and atraumatic.  Mouth/Throat: Oropharynx is clear and moist.  Neck: Normal range of motion. Neck supple.  Cardiovascular:  Normal rate and regular rhythm.   Pulmonary/Chest: Effort normal and breath sounds normal.  Abdominal: Soft. Obese. There is no tenderness. There is no rebound and no guarding.  Musculoskeletal: Normal range of motion.  Neurological: Alert, no facial droop, fluent speech, moves all extremities symmetrically Skin: Skin is warm and dry.  Psychiatric: Cooperative   ED Treatments / Results  Labs (all labs ordered are listed, but only abnormal results are displayed) Labs Reviewed  CBC WITH DIFFERENTIAL/PLATELET - Abnormal; Notable for the following:       Result Value   Hemoglobin 9.6 (*)    HCT 33.7 (*)    MCH 22.3 (*)    MCHC 28.5 (*)    RDW 20.7 (*)    All other components within normal limits  URINALYSIS, ROUTINE W REFLEX MICROSCOPIC - Abnormal; Notable for the following:    APPearance HAZY (*)  Leukocytes, UA MODERATE (*)    Bacteria, UA FEW (*)    Squamous Epithelial / LPF 0-5 (*)    All other components within normal limits  COMPREHENSIVE METABOLIC PANEL - Abnormal; Notable for the following:    Glucose, Bld 117 (*)    BUN 5 (*)    Calcium 8.7 (*)    Albumin 3.0 (*)    ALT 13 (*)    All other components within normal limits  BRAIN NATRIURETIC PEPTIDE  HEMOGLOBIN A1C  POC OCCULT BLOOD, ED  I-STAT TROPOININ, ED  I-STAT TROPOININ, ED  TYPE AND SCREEN    EKG  EKG Interpretation  Date/Time:  Monday June 24 2017 10:25:19 EDT Ventricular Rate:  78 PR Interval:  172 QRS Duration: 86 QT Interval:  388 QTC Calculation: 442 R Axis:   36 Text Interpretation:  Normal sinus rhythm Cannot rule out Anterior infarct , age undetermined Abnormal ECG no acute changes  Confirmed by Brantley Stage [phone]) on 06/24/2017 10:41:29 AM       Radiology Dg Chest 2 View  Result Date: 06/24/2017 CLINICAL DATA:  Shortness of breath for several weeks. Worse over the last few hours. EXAM: CHEST  2 VIEW COMPARISON:  One-view chest x-ray 05/12/2011 FINDINGS: The heart is enlarged. A  moderate-sized hiatal hernia is present. Moderate pulmonary vascular congestion is present. There is no frank edema. No significant airspace consolidation is present. There are no effusions. IMPRESSION: Stable cardiomegaly with moderate pulmonary vascular congestion. Electronically Signed   By: San Morelle M.D.   On: 06/24/2017 11:14   Ct Angio Chest Pe W And/or Wo Contrast  Result Date: 06/24/2017 CLINICAL DATA:  Dyspnea on exertion. Fatigue for the last few days. History of pulmonary embolus. EXAM: CT ANGIOGRAPHY CHEST WITH CONTRAST TECHNIQUE: Multidetector CT imaging of the chest was performed using the standard protocol during bolus administration of intravenous contrast. Multiplanar CT image reconstructions and MIPs were obtained to evaluate the vascular anatomy. CONTRAST:  100 cc Isovue 370 COMPARISON:  Ventilation perfusion lung scan from 05/12/2011 FINDINGS: Cardiovascular: Wellness filling defect is identified in the pulmonary arterial tree, patient body habitus, motion artifact, and dilution of contrast results in significantly suboptimal contrast opacification reducing sensitivity of today' s exam in segmental and subsegmental pulmonary arterial branches. There is atherosclerotic calcification of the aortic arch. Mild cardiomegaly. Mediastinum/Nodes: New moderate to large type 1 hiatal hernia. No pathologic adenopathy in the chest. Lungs/Pleura: Subtle mosaic attenuation likely from air trapping. Otherwise unremarkable. Upper Abdomen: Low-density prominence of the left adrenal gland without overt mass. Musculoskeletal: Left greater than right degenerative glenohumeral arthropathy. Extensive thoracic spondylosis with bridging spurring. Review of the MIP images confirms the above findings. IMPRESSION: 1. No embolus identified; significantly reduced sensitivity for segmental and subsegmental emboli due to body habitus, motion artifact, and dilution of contrast bolus. I do not feel in this case  that repeat bolus would be helpful. 2. Moderate to large type 1 hiatal hernia. 3. Subtle mosaic attenuation in the lungs likely from mild air trapping. 4.  Aortic Atherosclerosis (ICD10-I70.0). Electronically Signed   By: Van Clines M.D.   On: 06/24/2017 13:46    Procedures Procedures (including critical care time)  Medications Ordered in ED Medications  traMADol (ULTRAM) tablet 50 mg (not administered)  pantoprazole (PROTONIX) EC tablet 40 mg (not administered)  ferrous sulfate tablet 325 mg (not administered)  enoxaparin (LOVENOX) injection 40 mg (not administered)  sodium chloride flush (NS) 0.9 % injection 3 mL (not administered)  acetaminophen (TYLENOL) tablet  650 mg (not administered)    Or  acetaminophen (TYLENOL) suppository 650 mg (not administered)  HYDROcodone-acetaminophen (NORCO/VICODIN) 5-325 MG per tablet 1-2 tablet (not administered)  ondansetron (ZOFRAN) tablet 4 mg (not administered)    Or  ondansetron (ZOFRAN) injection 4 mg (not administered)  senna-docusate (Senokot-S) tablet 1 tablet (not administered)  traZODone (DESYREL) tablet 25 mg (not administered)  albuterol (PROVENTIL) (2.5 MG/3ML) 0.083% nebulizer solution 2.5 mg (not administered)  insulin aspart (novoLOG) injection 0-9 Units (not administered)  furosemide (LASIX) injection 40 mg (not administered)  furosemide (LASIX) injection 20 mg (not administered)  iopamidol (ISOVUE-370) 76 % injection (100 mLs  Contrast Given 06/24/17 1312)     Initial Impression / Assessment and Plan / ED Course  I have reviewed the triage vital signs and the nursing notes.  Pertinent labs & imaging results that were available during my care of the patient were reviewed by me and considered in my medical decision making (see chart for details).     Records reviewed. Patient had colonoscopy and endoscopy in 2015 showing Cameron lesion from hiatal hernia as well as diverticulosis.  Patient presents for concern of  anemia and possible GI bleed. She is hemodynamically stable. She is stable anemia 9.6, and with guaiac-negative stool. Presentation is not concerning for that at this time. Given previous history of PE but no longer on anticoagulation she did undergo CT angiogram of the chest. This is visualized. There is no evidence of obvious PE although somewhat limited CT. No evidence of edema, infiltrate, or other acute cardiopulmonary processes. EKG is nonischemic, and serial troponins are normal.  Is extremely dyspneic with minimal activity. Just from turning onto her side in bed becomes at With respiratory rate in the 30s. With standing up and sitting on a bedside commode, her pulse ox drops to low 90% and she is tachypneic with respirations of 30. Unclear etiology of her symptoms, but given severity and acute nature of her symptoms plan to admit for observation for echo and potentially additional workup. Discussed with Dyanne Carrel.   Final Clinical Impressions(s) / ED Diagnoses   Final diagnoses:  DOE (dyspnea on exertion)  Shortness of breath    New Prescriptions New Prescriptions   No medications on file     Forde Dandy, MD 06/24/17 1538    Forde Dandy, MD 06/24/17 1538

## 2017-06-24 NOTE — Telephone Encounter (Addendum)
Attempt to patient. Left message on voicemail to call back.

## 2017-06-24 NOTE — Telephone Encounter (Signed)
[facility] pt has scheduled appt on 06/28/17 for an Iron treatment and that pt called stating that she is experiencing sever SOB and would like to go get her treatment earlier if possible. Would like RN to call and speak to pt to advise of options.

## 2017-06-24 NOTE — ED Notes (Signed)
Pt moving from bed to bedside toilet pt became very short of breath and sat's to the low 90's with respirations 30

## 2017-06-24 NOTE — ED Notes (Signed)
Attempted Report 

## 2017-06-24 NOTE — H&P (Signed)
History and Physical    Brenda Clark GYF:[id number] DOB: 11-14-1951 DOA: 06/24/2017  PCP: Boykin Nearing, MD Patient coming from: home  Chief Complaint: sob  HPI: Brenda Clark is a 65 y.o. female with medical history significant morbid obesity, anemia, diabetes, edema, hypertension, presents to the emergency department with chief complaint of worsening shortness of breath. Initial evaluation concerning for acute heart failure  Information is obtained from the patient. Patient reports one-week history of worsening generalized weakness and dyspnea on exertion. She states she has been anemic in the past and this is what it felt like. She admits symptoms currently are not as bad as they were in that scenario. He denies headache dizziness syncope or near-syncope. She denies chest pain palpitation  nausea vomiting dysuria hematuria frequency or urgency. She denies any diarrhea constipation melena or bright red blood per rectum. She does report some intermittent lower extremity edema with ambulation. She denies any fever chills recent travel or sick contacts    ED Course: In the emergency department blood pressures a high end of normal she has tachypnea but is not hypoxic.  Review of Systems: As per HPI otherwise all other systems reviewed and are negative.   Ambulatory Status: She ambulates independently with a very unsteady gait. She lives alone is independent with ADLs  Past Medical History:  Diagnosis Date  . Arthritis    DDD and HNP on MRI in 2008  . 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 Chronic pain   . Endometrial polyp 08/2010   biopsied.   Marland Kitchen GERD (gastroesophageal reflux disease)   . Headache, migraine   . Hiatal hernia 03/05/2014  . Hyperlipidemia   . Iron deficiency anemia, unspecified    ice pica  . Migraine, unspecified, without mention of intractable migraine without mention of status migrainosus   . Morbid obesity ([place])   . OSA (obstructive sleep apnea)   . Osteoarthrosis,  unspecified whether generalized or localized, lower leg   . Other and unspecified hyperlipidemia   . Pulmonary embolus ([place]) 2012  . Retinal detachment   . Type II or unspecified type diabetes mellitus without mention of complication, not stated as uncontrolled   . Unspecified essential hypertension     Past Surgical History:  Procedure Laterality Date  . CATARACT EXTRACTION    . COLONOSCOPY WITH PROPOFOL N/A 03/05/2014   Procedure: COLONOSCOPY WITH PROPOFOL;  Surgeon: Gatha Mayer, MD;  Location: [facility];  Service: Endoscopy;  Laterality: N/A;  . ESOPHAGOGASTRODUODENOSCOPY (EGD) WITH PROPOFOL N/A 03/05/2014   Procedure: ESOPHAGOGASTRODUODENOSCOPY (EGD) WITH PROPOFOL;  Surgeon: Gatha Mayer, MD;  Location: [facility];  Service: Endoscopy;  Laterality: N/A;  . LUMBAR [place]    . TUBAL LIGATION      Social History   Social History  . Marital status: Divorced    Spouse name: N/A  . Number of children: 2  . Years of education: HS   Occupational History  . DISABLED Disabled    since 2007   Social History Main Topics  . Smoking status: Never Smoker  . Smokeless tobacco: Never Used  . Alcohol use No  . Drug use: No  . Sexual activity: Not on file   Other Topics Concern  . Not on file   Social History Narrative   Patient with obesity, hyperventilation and obstructive apneas, successfully treated with CPAP at 10 cm water. Good residual AHI values for the last year and low Epworth score. Compliance is good. [facility] follows her.  Her GERD is better on omeprazole and this has further reduced her nocturnal awakenings. She is still hypertensive and needs urgently to lose weight, this would positivley affect her diabetes as well.       I convinced her to take her Topamax and take her Lasix in the AM at 20 mg. She was advised of low carb diet and potassium rich foods.       Patient lives at home alone.   Caffeine Use: occasionally    No Known  Allergies  Family History  Problem Relation Age of Onset  . Colon cancer Father   . Breast cancer Sister   . Diabetes Sister     Prior to Admission medications   Medication Sig Start Date End Date Taking? Authorizing Provider  acetaminophen (TYLENOL) 500 MG tablet Take 2 tablets (1,000 mg total) by mouth every 8 (eight) hours as needed. 10/23/16  Yes Funches, Adriana Mccallum, MD  ferrous sulfate 325 (65 FE) MG tablet Take 1 tablet (325 mg total) by mouth 2 (two) times daily with a meal. 10/23/16  Yes Funches, Josalyn, MD  losartan-hydrochlorothiazide (HYZAAR) 50-12.5 MG tablet Take 2 tablets by mouth daily. 05/23/17  Yes Funches, Adriana Mccallum, MD  metFORMIN (GLUCOPHAGE-XR) 500 MG 24 hr tablet Take 1 tablet (500 mg total) by mouth daily with breakfast. 10/23/16  Yes Funches, Josalyn, MD  omeprazole (PRILOSEC) 20 MG capsule Take 2 capsules (40 mg total) by mouth daily. 11/20/16  Yes Funches, Josalyn, MD  traMADol (ULTRAM) 50 MG tablet Take 1 tablet (50 mg total) by mouth 3 (three) times daily. 06/21/17  Yes Funches, Josalyn, MD  atorvastatin (LIPITOR) 40 MG tablet Take 1 tablet (40 mg total) by mouth daily. Patient not taking: Reported on 05/23/2017 10/25/16   Boykin Nearing, MD  Blood Glucose Monitoring Suppl (ACCU-CHEK AVIVA PLUS) w/Device KIT 1 Device by Does not apply route 2 (two) times daily. 11/20/16   Funches, Adriana Mccallum, MD  glucose blood (ACCU-CHEK AVIVA PLUS) test strip 1 each by Other route 2 (two) times daily. 11/20/16   Boykin Nearing, MD  Misc. Devices ([place]) Max Walker with seat 05/23/17   Boykin Nearing, MD  nitroGLYCERIN (NITROSTAT) 0.4 MG SL tablet Place 0.4 mg under the tongue every 5 (five) minutes as needed. Reported on 01/19/2016    [provider]    Physical Exam: Vitals:   06/24/17 1337 06/24/17 1500 06/24/17 1530 06/24/17 1550  BP: (!) 163/78 (!) 149/83 (!) 166/92   Pulse: 77 72 71   Resp: (!) 21 14 (!) 33   Temp:      TempSrc:      SpO2: 99%  100% 97% 98%  Weight:      Height:         General:  Appears Slightly anxious  (but not uncomfortable :  PERRL, EOMI, normal lids, iris ENT:  grossly normal hearing, lips & tongue, mmm very poor dentition Neck:  no LAD, masses or thyromegaly Cardiovascular:  RRR, no m/r/g. No LE edema.  Respiratory:  Increased work of breathing with conversation. Breath sounds somewhat distant. Faint wheezing. No crackles Abdomen:  soft, ntnd, is a bowel sounds no guarding or rebounding Skin:  no rash or induration seen on limited exam Musculoskeletal:  grossly normal tone BUE/BLE, good ROM, no bony abnormality Psychiatric:  grossly normal mood and affect, speech fluent and appropriate, AOx3 Neurologic:  CN 2-12 grossly intact, moves all extremities in coordinated fashion, sensation intact   Labs on Admission: I have personally reviewed  following labs and imaging studies  CBC:  Recent Labs Lab 06/24/17 1050  WBC 5.8  NEUTROABS 4.3  HGB 9.6*  HCT 33.7*  MCV 78.4  PLT 024   Basic Metabolic Panel:  Recent Labs Lab 06/24/17 1126  NA 139  K 3.9  CL 105  CO2 26  GLUCOSE 117*  BUN 5*  CREATININE 0.73  CALCIUM 8.7*   GFR: Estimated Creatinine Clearance: 112.3 mL/min (by C-G formula based on SCr of 0.73 mg/dL). Liver Function Tests:  Recent Labs Lab 06/24/17 1126  AST 19  ALT 13*  ALKPHOS 92  BILITOT 0.3  PROT 6.9  ALBUMIN 3.0*   No results for input(s): LIPASE, AMYLASE in the last 168 hours. No results for input(s): AMMONIA in the last 168 hours. Coagulation Profile: No results for input(s): INR, PROTIME in the last 168 hours. Cardiac Enzymes: No results for input(s): CKTOTAL, CKMB, CKMBINDEX, TROPONINI in the last 168 hours. BNP (last 3 results) No results for input(s): PROBNP in the last 8760 hours. HbA1C: No results for input(s): HGBA1C in the last 72 hours. CBG: No results for input(s): GLUCAP in the last 168 hours. Lipid Profile: No results for input(s): CHOL,  HDL, LDLCALC, TRIG, CHOLHDL, LDLDIRECT in the last 72 hours. Thyroid Function Tests: No results for input(s): TSH, T4TOTAL, FREET4, T3FREE, THYROIDAB in the last 72 hours. Anemia Panel: No results for input(s): VITAMINB12, FOLATE, FERRITIN, TIBC, IRON, RETICCTPCT in the last 72 hours. Urine analysis:    Component Value Date/Time   COLORURINE YELLOW 06/24/2017 1125   APPEARANCEUR HAZY (A) 06/24/2017 1125   LABSPEC 1.015 06/24/2017 1125   PHURINE 6.0 06/24/2017 1125   GLUCOSEU NEGATIVE 06/24/2017 1125   HGBUR NEGATIVE 06/24/2017 1125   HGBUR large 08/24/2010 0832   BILIRUBINUR NEGATIVE 06/24/2017 1125   KETONESUR NEGATIVE 06/24/2017 1125   PROTEINUR NEGATIVE 06/24/2017 1125   UROBILINOGEN 0.2 03/28/2015 1151   NITRITE NEGATIVE 06/24/2017 1125   LEUKOCYTESUR MODERATE (A) 06/24/2017 1125    Creatinine Clearance: Estimated Creatinine Clearance: 112.3 mL/min (by C-G formula based on SCr of 0.73 mg/dL).  Sepsis Labs: _0 (procalcitonin:4,lacticidven:4) )No results found for this or any previous visit (from the past 240 hour(s)).   Radiological Exams on Admission: Dg Chest 2 View  Result Date: 06/24/2017 CLINICAL DATA:  Shortness of breath for several weeks. Worse over the last few hours. EXAM: CHEST  2 VIEW COMPARISON:  One-view chest x-ray 05/12/2011 FINDINGS: The heart is enlarged. A moderate-sized hiatal hernia is present. Moderate pulmonary vascular congestion is present. There is no frank edema. No significant airspace consolidation is present. There are no effusions. IMPRESSION: Stable cardiomegaly with moderate pulmonary vascular congestion. Electronically Signed   By: San Morelle M.D.   On: 06/24/2017 11:14   Ct Angio Chest Pe W And/or Wo Contrast  Result Date: 06/24/2017 CLINICAL DATA:  Dyspnea on exertion. Fatigue for the last few days. History of pulmonary embolus. EXAM: CT ANGIOGRAPHY CHEST WITH CONTRAST TECHNIQUE: Multidetector CT imaging of the chest was  performed using the standard protocol during bolus administration of intravenous contrast. Multiplanar CT image reconstructions and MIPs were obtained to evaluate the vascular anatomy. CONTRAST:  100 cc Isovue 370 COMPARISON:  Ventilation perfusion lung scan from 05/12/2011 FINDINGS: Cardiovascular: Wellness filling defect is identified in the pulmonary arterial tree, patient body habitus, motion artifact, and dilution of contrast results in significantly suboptimal contrast opacification reducing sensitivity of today' s exam in segmental and subsegmental pulmonary arterial branches. There is atherosclerotic calcification of the aortic arch. Mild  cardiomegaly. Mediastinum/Nodes: New moderate to large type 1 hiatal hernia. No pathologic adenopathy in the chest. Lungs/Pleura: Subtle mosaic attenuation likely from air trapping. Otherwise unremarkable. Upper Abdomen: Low-density prominence of the left adrenal gland without overt mass. Musculoskeletal: Left greater than right degenerative glenohumeral arthropathy. Extensive thoracic spondylosis with bridging spurring. Review of the MIP images confirms the above findings. IMPRESSION: 1. No embolus identified; significantly reduced sensitivity for segmental and subsegmental emboli due to body habitus, motion artifact, and dilution of contrast bolus. I do not feel in this case that repeat bolus would be helpful. 2. Moderate to large type 1 hiatal hernia. 3. Subtle mosaic attenuation in the lungs likely from mild air trapping. 4.  Aortic Atherosclerosis (ICD10-I70.0). Electronically Signed   By: Van Clines M.D.   On: 06/24/2017 13:46    EKG: Independently reviewed. Normal sinus rhythm Cannot rule out Anterior infarct , age undetermined Abnormal ECG no acute changes  Assessment/Plan Principal Problem:   DOE (dyspnea on exertion) Active Problems:   Diabetes type 2, controlled (HCC)   Hyperlipidemia   MORBID OBESITY   Obesity hypoventilation syndrome (HCC)    HTN (hypertension)   Anemia, iron deficiency   #1. Dyspnea with exertion. Etiology uncertain. Oxygen saturation level goes from 99-90 respiratory rate goes from 20-30 with the mildest physical activity. Chest x-ray table cardiomegaly with moderate cardiovascular congestion. CT angio chest reveals no embolus no hearing significantly reduced sensitivity for segmental and subsegmental emboli due to body habitus. BNP 23. Echo 2014 with an EF of 55% mild LVH and grade 1 diastolic dysfunction -Admit to telemetry -IV Lasix -Scheduled nebs -Echocardiogram -Monitor intake and output -Obtain daily weights -Monitor oxygen saturation level -Physical therapy  #2. Hypertension. Pressure on the high end of normal while in the emergency department. Home medications include losartan, hydrochlorothiazide -Continue losartan -Hold the hydrochlorothiazide for now given that we are providing IV Lasix -Monitor  #3. Anemia. Patient with a history of iron deficiency anemia. History of GI bleed and PE was on anticoagulation but this was stopped once GI bleed identified. Hemoglobin 9.6 on admission. Chart review indicates this is her baseline. -FOBT -Monitor  #4. Diabetes. Type II. Serum glucose 117 on admission. Home medications include metformin. -We'll hold metformin for now -We'll obtain a hemoglobin A1c -Sliding scale insulin for optimal control  5. Obesity ventilation syndrome. Chart review indicates sleep apnea with CPAP. Suspect her baseline is oxygen saturation level around 90 and mild tachypnea. BMI 69.1 -nutritional consult     DVT prophylaxis: scd  Code Status: full  Family Communication: none present  Disposition Plan: home  Consults called: none  Admission status: obs    Dyanne Carrel M MD [facility]  If 7PM-7AM, please contact night-coverage [email or web address] Password [facility]  06/24/2017, 4:13 PM

## 2017-06-24 NOTE — Progress Notes (Signed)
Admission note:  Arrival Method: via stretcher Mental Orientation: A&Ox4 Telemetry: box9, CCMD notified. Assessment: in progress Skin: warm, dry and intact. IV: L AC NSL Pain: denies Safety Measures: discussed and reviewed with pt, verbalized understanding. Fall Prevention Safety Plan: call bell and belongings with reach, bed in low position. Admission Screening: in progress 6700 Orientation: Patient has been oriented to the unit, staff and to the room.

## 2017-06-24 NOTE — ED Notes (Signed)
Patient transported to X-ray 

## 2017-06-24 NOTE — Telephone Encounter (Signed)
Patient was in no distress at her last visit If SOB is truly severe she will need emergent evaluation, advised ED visit

## 2017-06-25 ENCOUNTER — Other Ambulatory Visit (HOSPITAL_COMMUNITY): Payer: Self-pay

## 2017-06-25 DIAGNOSIS — D509 Iron deficiency anemia, unspecified: Secondary | ICD-10-CM

## 2017-06-25 DIAGNOSIS — R0609 Other forms of dyspnea: Secondary | ICD-10-CM | POA: Diagnosis not present

## 2017-06-25 DIAGNOSIS — I5032 Chronic diastolic (congestive) heart failure: Secondary | ICD-10-CM

## 2017-06-25 DIAGNOSIS — E119 Type 2 diabetes mellitus without complications: Secondary | ICD-10-CM

## 2017-06-25 DIAGNOSIS — E118 Type 2 diabetes mellitus with unspecified complications: Secondary | ICD-10-CM | POA: Diagnosis not present

## 2017-06-25 DIAGNOSIS — R0600 Dyspnea, unspecified: Secondary | ICD-10-CM | POA: Diagnosis not present

## 2017-06-25 LAB — FERRITIN: Ferritin: 12 ng/mL (ref 11–307)

## 2017-06-25 LAB — IRON AND TIBC
IRON: 24 ug/dL — AB (ref 28–170)
Saturation Ratios: 6 % — ABNORMAL LOW (ref 10.4–31.8)
TIBC: 400 ug/dL (ref 250–450)
UIBC: 376 ug/dL

## 2017-06-25 LAB — VITAMIN B12: Vitamin B-12: 344 pg/mL (ref 180–914)

## 2017-06-25 LAB — FOLATE: FOLATE: 24 ng/mL (ref >5.9)

## 2017-06-25 LAB — RETICULOCYTES
RBC.: 4.4 MIL/uL (ref 3.87–5.11)
RETIC COUNT ABSOLUTE: 83.6 10*3/uL (ref 19.0–186.0)
Retic Ct Pct: 1.9 % (ref 0.4–3.1)

## 2017-06-25 LAB — GLUCOSE, CAPILLARY
GLUCOSE-CAPILLARY: 139 mg/dL — AB (ref 65–99)
GLUCOSE-CAPILLARY: 161 mg/dL — AB (ref 65–99)
Glucose-Capillary: 135 mg/dL — ABNORMAL HIGH (ref 65–99)
Glucose-Capillary: 120 mg/dL — ABNORMAL HIGH (ref 65–99)
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
## 2017-06-25 NOTE — Care Management Obs Status (Signed)
MEDICARE OBSERVATION STATUS NOTIFICATION   Patient Details  Name: Brenda SeatMary M Saddler MRN: [id number] Date of Birth: 08-Mar-1951   Medicare Observation Status Notification Given:  Yes    Anyelin Mogle, Annamarie Majorheryl U, RN 06/25/2017, 5:22 PM

## 2017-06-25 NOTE — Progress Notes (Signed)
Nutrition Education Note  RD consulted for nutrition education regarding weight loss.  Body mass index is 65.84 kg/m. Pt meets criteria for morbid obesity based on current BMI.  Lipid Panel     Component Value Date/Time   CHOL 214 (H) 10/23/2016 1123   TRIG 128 10/23/2016 1123   HDL 57 10/23/2016 1123   CHOLHDL 3.8 10/23/2016 1123   VLDL 26 10/23/2016 1123   LDLCALC 131 (H) 10/23/2016 1123    Lab Results  Component Value Date   HGBA1C 6.5 05/23/2017    RD provided "Heart Healthy Consistent Carbohydrate Nutrition Therapy" handout from the Academy of [employer]etics. Reviewed patient's dietary recall. Provided examples on ways to decrease sodium and fat intake in diet. Discouraged intake of processed foods and use of salt shaker. Encouraged fresh fruits and vegetables as well as whole grain sources of carbohydrates to maximize fiber intake.    Discussed different food groups and their effects on blood sugar, emphasizing carbohydrate-containing foods. Provided list of carbohydrates and recommended serving sizes of common foods.  Discussed importance of controlled and consistent carbohydrate intake throughout the day. Provided examples of ways to balance meals/snacks and encouraged intake of high-fiber, whole grain complex carbohydrates.   Emphasized the importance of hydration with calorie-free beverages and limiting sugar-sweetened beverages. Encouraged pt to discuss physical activity options with physician; pt very limited at this time due to SOB and weight. Teach back method used.   Expect fair compliance.  Current diet order is Heart Healthy/Carb Modified, patient is consuming approximately 100% of meals at this time. Labs and medications reviewed. No further nutrition interventions warranted at this time. RD contact information provided. If additional nutrition issues arise, please re-consult RD.  Romelle Starcherate Audrionna Lampton MS, RD, LDN [phone] Pager  [phone]  Weekend/On-Call Pager

## 2017-06-26 ENCOUNTER — Other Ambulatory Visit (HOSPITAL_COMMUNITY): Payer: Self-pay

## 2017-06-26 ENCOUNTER — Observation Stay (HOSPITAL_BASED_OUTPATIENT_CLINIC_OR_DEPARTMENT_OTHER): Payer: Medicare Other

## 2017-06-26 DIAGNOSIS — I517 Cardiomegaly: Secondary | ICD-10-CM

## 2017-06-26 DIAGNOSIS — R0609 Other forms of dyspnea: Secondary | ICD-10-CM | POA: Diagnosis not present

## 2017-06-26 DIAGNOSIS — R0600 Dyspnea, unspecified: Secondary | ICD-10-CM | POA: Diagnosis not present

## 2017-06-26 LAB — BASIC METABOLIC PANEL
Anion gap: 9 (ref 5–15)
BUN: 14 mg/dL (ref 6–20)
CHLORIDE: 104 mmol/L (ref 101–111)
CO2: 25 mmol/L (ref 22–32)
CREATININE: 0.82 mg/dL (ref 0.44–1.00)
Calcium: 8.7 mg/dL — ABNORMAL LOW (ref 8.9–10.3)
GFR calc Af Amer: >60 mL/min (ref >60)
GFR calc non Af Amer: >60 mL/min (ref >60)
Glucose, Bld: 131 mg/dL — ABNORMAL HIGH (ref 65–99)
Potassium: 3.4 mmol/L — ABNORMAL LOW (ref 3.5–5.1)
Sodium: 138 mmol/L (ref 135–145)

## 2017-06-26 LAB — ECHOCARDIOGRAM COMPLETE
Height: 64 in
WEIGHTICAEL: 6184 [oz_av]

## 2017-06-26 LAB — CBC
HCT: 31.7 % — ABNORMAL LOW (ref 36.0–46.0)
Hemoglobin: 9.2 g/dL — ABNORMAL LOW (ref 12.0–15.0)
MCH: 22.8 pg — AB (ref 26.0–34.0)
MCHC: 29 g/dL — ABNORMAL LOW (ref 30.0–36.0)
MCV: 78.5 fL (ref 78.0–100.0)
PLATELETS: 333 10*3/uL (ref 150–400)
RBC: 4.04 MIL/uL (ref 3.87–5.11)
RDW: 20.9 % — AB (ref 11.5–15.5)
WBC: 4.8 10*3/uL (ref 4.0–10.5)

## 2017-06-26 LAB — GLUCOSE, CAPILLARY
Glucose-Capillary: 132 mg/dL — ABNORMAL HIGH (ref 65–99)
Glucose-Capillary: 132 mg/dL — ABNORMAL HIGH (ref 65–99)
Glucose-Capillary: 128 mg/dL — ABNORMAL HIGH (ref 65–99)

## 2017-06-26 MED ORDER — FLUTICASONE PROPIONATE 50 MCG/ACT NA SUSP: 1.0000 | Freq: Every day | NASAL | 0 refills | Status: DC

## 2017-06-26 MED ORDER — FUROSEMIDE 20 MG PO TABS: 20.0000 mg | ORAL_TABLET | Freq: Every day | ORAL | 0 refills | Status: DC | PRN

## 2017-06-26 MED ORDER — ALBUTEROL SULFATE HFA 108 (90 BASE) MCG/ACT IN AERS: 2.0000 | INHALATION_SPRAY | Freq: Four times a day (QID) | RESPIRATORY_TRACT | 2 refills | Status: DC | PRN

## 2017-06-26 NOTE — Care Management Note (Signed)
Case Management Note  Patient Details  Name: Brenda Clark MRN: [id number] Date of Birth: 15-Jul-1951  Subjective/Objective:           Patient presented [facility] ED with c/o SOB, exacerbation of CHF         Action/Plan: ED CM consulted concerning recommendations for [facility] services. CM met with patient to discuss recommendations patient agreeable offered choice [facility] selected verified contact information. Denies any DME needs. Referral faxed to [facility] via [place].  Explained that a nurse from [facility] will contact patient a the verified phone 24- 48 hours post discharge patient verbalized understanding teach back done..  No further questions or concerns verbalized. No additional CM needs identified  Expected Discharge Date:  06/26/17               Expected Discharge Plan:  [place]  In-House Referral:     Discharge planning Services  CM Consult  Post Acute Care Choice:    Choice offered to:  Patient  DME Arranged:    DME Agency:  NA  HH Arranged:  RN, PT, OT [place] Agency:  [place]  Status of Service:  Completed, signed off  If discussed at [place] of Stay Meetings, dates discussed:    Additional CommentsLaurena Slimmer, RN 06/26/2017, 7:47 PM

## 2017-06-26 NOTE — Evaluation (Signed)
Physical Therapy Evaluation Patient Details Name: Brenda Clark MRN: [id number] DOB: Feb 04, 1951 Today's Date: 06/26/2017   History of Present Illness  66 yo AA, morbidly obese female presented to the ED 06/24/17 with h/o DVT/PE 2012 (no current anticoagulant), h/o chronic fatigue and DOE, OSA on CPAP nightly, HTN, retinal detachment, HLD, DM, osteoarthritis, iron deficiency anemia and c/o SOB, weakness, fatigue, and DOE; acute on chronic CHF  Clinical Impression   Pt admitted with above diagnosis. Pt currently with functional limitations due to the deficits listed below (see PT Problem List). Presents with decr functional capacity, DOE; Pt will benefit from skilled PT to increase their independence and safety with mobility to allow discharge to the venue listed below.    Performed O2 qualifying walk; walked on Room Air, and lowest noted O2 sat was 87%; able to incr back to more acceptable level of 93% with standing rest break and focused breathing; did not need supplemental O2 to incr O2 sats; at this point supplemental O2 not indicated fo rthe home    Follow Up Recommendations Home health PT    Equipment Recommendations  None recommended by PT    Recommendations for Other Services OT consult     Precautions / Restrictions Precautions Precautions: Fall;Other (comment) Precaution Comments: DOE      Mobility  Bed Mobility               General bed mobility comments: sitting EOB upon arrival  Transfers Overall transfer level: Needs assistance Equipment used: Rolling walker (2 wheeled) Transfers: Sit to/from Stand Sit to Stand: Min guard         General transfer comment: very dependent on momentum for successful sit to stand  Ambulation/Gait Ambulation/Gait assistance: Min guard;+2 safety/equipment Ambulation Distance (Feet): 70 Feet Assistive device: Rolling walker (2 wheeled) Gait Pattern/deviations: Step-through pattern;Decreased step length - right;Decreased step  length - left;Trunk flexed Gait velocity: slowed Gait velocity interpretation: Below normal speed for age/gender General Gait Details: initiated walk without assistive device, but noted Ms. Batchelder tended to reach out for UE support; used Rw with greater steadiness remainder of walk; tired at end, decr endurance; walked on Room Air  Stairs            Wheelchair Mobility    Modified Rankin (Stroke Patients Only)       Balance Overall balance assessment: Needs assistance   Sitting balance-Leahy Scale: Good       Standing balance-Leahy Scale: Fair                               Pertinent Vitals/Pain Pain Assessment: No/denies pain    Home Living Family/patient expects to be discharged to:: Private residence Living Arrangements: Alone Available Help at Discharge: Family;Available PRN/intermittently Type of Home: Apartment Home Access: Level entry     Home Layout: One level Home Equipment: Walker - 2 wheels;Cane - single point (CPAP)      Prior Function Level of Independence: Independent with assistive device(s);Needs assistance   Gait / Transfers Assistance Needed: Gilmer Mor, RW prn  ADL's / Homemaking Assistance Needed: Daughter assists with ADLs        Hand Dominance        Extremity/Trunk Assessment   Upper Extremity Assessment Upper Extremity Assessment: Overall WFL for tasks assessed    Lower Extremity Assessment Lower Extremity Assessment: Generalized weakness (decr endurance)    Cervical / Trunk Assessment Cervical / Trunk Assessment: Normal  Communication  Cognition Arousal/Alertness: Awake/alert Behavior During Therapy: WFL for tasks assessed/performed Overall Cognitive Status: Within Functional Limits for tasks assessed                                        General Comments      Exercises     Assessment/Plan    PT Assessment Patient needs continued PT services  PT Problem List Decreased  strength;Decreased activity tolerance;Decreased balance;Decreased mobility;Decreased knowledge of use of DME;Decreased knowledge of precautions;Cardiopulmonary status limiting activity       PT Treatment Interventions DME instruction;Gait training;Functional mobility training;Therapeutic activities;Therapeutic exercise;Patient/family education    PT Goals (Current goals can be found in the Care Plan section)  Acute Rehab PT Goals Patient Stated Goal: walk better, be able to do more PT Goal Formulation: With patient Time For Goal Achievement: 07/10/17    Frequency Min 3X/week   Barriers to discharge        Co-evaluation               AM-PAC PT "6 Clicks" Daily Activity  Outcome Measure Difficulty turning over in bed (including adjusting bedclothes, sheets and blankets)?: A Little Difficulty moving from lying on back to sitting on the side of the bed? : A Little Difficulty sitting down on and standing up from a chair with arms (e.g., wheelchair, bedside commode, etc,.)?: A Little Help needed moving to and from a bed to chair (including a wheelchair)?: A Little Help needed walking in hospital room?: A Little Help needed climbing 3-5 steps with a railing? : A Lot 6 Click Score: 17    End of Session Equipment Utilized During Treatment: Other (comment) (pulse oximeter) Activity Tolerance: Patient limited by fatigue Patient left: in chair;with call bell/phone within reach;with family/visitor present Nurse Communication: Mobility status PT Visit Diagnosis: Other abnormalities of gait and mobility (R26.89)    Time: [id number] PT Time Calculation (min) (ACUTE ONLY): 19 min   Charges:   PT Evaluation $PT Eval Low Complexity: 1 Procedure     PT G Codes:   PT G-Codes **NOT FOR INPATIENT CLASS** Functional Assessment Tool Used: Clinical judgement Functional Limitation: Mobility: Walking and moving around Mobility: Walking and Moving Around Current Status ([id number]([id number]): At least 20  percent but less than 40 percent impaired, limited or restricted Mobility: Walking and Moving Around Goal Status [phone]([id number]): 0 percent impaired, limited or restricted    Van ClinesHolly Liban Guedes, PT  Acute Rehabilitation Services Pager [phone] Office [phone]   Levi AlandHolly H Izsak Meir 06/26/2017, 3:26 PM

## 2017-06-26 NOTE — Progress Notes (Signed)
  Echocardiogram 2D Echocardiogram has been performed.  Janalyn HarderWest, Lawan Nanez R 06/26/2017, 5:04 PM

## 2017-06-28 ENCOUNTER — Other Ambulatory Visit (HOSPITAL_COMMUNITY): Payer: Self-pay | Admitting: *Deleted

## 2017-06-29 NOTE — Discharge Summary (Signed)
[facility] Discharge Summary   Patient: Brenda Clark WUJ:[id number]   PCP: Boykin Nearing, MD DOB: 01-13-1951   Date of admission: 06/24/2017   Date of discharge: 06/26/2017    Discharge Diagnoses:  Principal Problem:   DOE (dyspnea on exertion) Active Problems:   Diabetes type 2, controlled ([place])   Hyperlipidemia   MORBID OBESITY   Obesity hypoventilation syndrome (HCC)   HTN (hypertension)   Anemia, iron deficiency   Diabetes mellitus with complication ([place])   Acute congestive heart failure ([place])   Admitted From: home Disposition:  Home with family  Recommendations for Outpatient Follow-up:  1. Follow up with PCP in 1-2 weeks with a BMP. 2. Continue home health and therapy.   Follow-up Information    Boykin Nearing, MD. Schedule an appointment as soon as possible for a visit in 1 week(s).   Specialty:  Family Medicine Why:  with BMP Contact information: [place] [address] [phone]        [facility], [facility] Follow up.   Why:  Home Health RN Physical Therapy and Occupational Therapy Contact information: [address] [place] [address] [phone]          Diet recommendation: cardiac diet  Activity: The patient is advised to gradually reintroduce usual activities.  Discharge Condition: good  Code Status: full code  History of present illness: As per the H and P dictated on admission, "Brenda Clark is a 66 y.o. female with medical history significant morbid obesity, anemia, diabetes, edema, hypertension, presents to the emergency department with chief complaint of worsening shortness of breath. Initial evaluation concerning for acute heart failure  Information is obtained from the patient. Patient reports one-week history of worsening generalized weakness and dyspnea on exertion. She states she has been anemic in the past and this is what it felt like. She admits symptoms currently are not as bad  as they were in that scenario. He denies headache dizziness syncope or near-syncope. She denies chest pain palpitation  nausea vomiting dysuria hematuria frequency or urgency. She denies any diarrhea constipation melena or bright red blood per rectum. She does report some intermittent lower extremity edema with ambulation. She denies any fever chills recent travel or sick contacts"  Hospital Course:  Summary of her active problems in the hospital is as following. DOE -Likely multifactorial in the setting of obstructive sleep apnea, obesity hypoventilation syndrome, morbid obesity, concern for acute on chronic diastolic CHF -Discussed weight as contributing factor.  Acute on chronic diastolic Congestive Heart Failure -Echocardiogram Shows preserved EF, no comments on diastolic dysfunction due to poor windows. -Received 1 dose of Lasix in the ED, repeat dose of IV Lasix Was discharged on when necessary Lasix.  Morbid Obesity -Counseled on diet and exercise.  OSA, controlled with CPAP  -Patient reports compliance with CPAP.   HTN -Continue home regimen  Anemia, iron deficiency Hemoglobin stable. Continue to monitor as an outpatient.  DM2 Continue home regimen Arthritis -Tramadol prn  History of DVT / PE -CTA chest negative for PE  All other chronic medical condition were stable during the hospitalization.  Patient was seen by physical therapy, who recommended home health, which was arranged by Education officer, museum and case Freight forwarder. On the day of the discharge the patient's vitals were stable, and no other acute medical condition were reported by patient. the patient was felt safe to be discharge at home with home health.  Procedures and Results:  Echocardiogram   Consultations:  none  DISCHARGE MEDICATION: Discharge Medication List as of 06/26/2017  6:09 PM    START taking these medications   Details  albuterol (PROVENTIL HFA;VENTOLIN HFA) 108 (90 Base) MCG/ACT inhaler  Inhale 2 puffs into the lungs every 6 (six) hours as needed for wheezing or shortness of breath., Starting Wed 06/26/2017, Normal    fluticasone (FLONASE) 50 MCG/ACT nasal spray Place 1 spray into both nostrils daily., Starting Wed 06/26/2017, Until Thu 06/26/2018, Normal    furosemide (LASIX) 20 MG tablet Take 1 tablet (20 mg total) by mouth daily as needed for fluid or edema (Weight gain of more than 3 lbs in one day or 5 lbs in 2 days.)., Starting Wed 06/26/2017, Normal      CONTINUE these medications which have NOT CHANGED   Details  acetaminophen (TYLENOL) 500 MG tablet Take 2 tablets (1,000 mg total) by mouth every 8 (eight) hours as needed., Starting Tue 10/23/2016, Normal    atorvastatin (LIPITOR) 40 MG tablet Take 1 tablet (40 mg total) by mouth daily., Starting Thu 10/25/2016, Normal    Blood Glucose Monitoring Suppl (ACCU-CHEK AVIVA PLUS) w/Device KIT 1 Device by Does not apply route 2 (two) times daily., Starting Tue 11/20/2016, Normal    ferrous sulfate 325 (65 FE) MG tablet Take 1 tablet (325 mg total) by mouth 2 (two) times daily with a meal., Starting Tue 10/23/2016, Normal    glucose blood (ACCU-CHEK AVIVA PLUS) test strip 1 each by Other route 2 (two) times daily., Starting Tue 11/20/2016, Normal    losartan-hydrochlorothiazide (HYZAAR) 50-12.5 MG tablet Take 2 tablets by mouth daily., Starting Thu 05/23/2017, Normal    metFORMIN (GLUCOPHAGE-XR) 500 MG 24 hr tablet Take 1 tablet (500 mg total) by mouth daily with breakfast., Starting Tue 10/23/2016, Normal    Misc. Devices ([place]) [place] with seat, Print    nitroGLYCERIN (NITROSTAT) 0.4 MG SL tablet Place 0.4 mg under the tongue every 5 (five) minutes as needed. Reported on 01/19/2016, Until Discontinued, Historical Med    omeprazole (PRILOSEC) 20 MG capsule Take 2 capsules (40 mg total) by mouth daily., Starting Tue 11/20/2016, Normal    traMADol (ULTRAM) 50 MG tablet Take 1 tablet (50 mg total) by  mouth 3 (three) times daily., Starting Fri 06/21/2017, Print       No Known Allergies Discharge Instructions    Diet - low sodium heart healthy    Complete by:  As directed    Discharge instructions    Complete by:  As directed    It is important that you read following instructions as well as go over your medication list with RN to help you understand your care after this hospitalization.  Discharge Instructions: Please follow-up with PCP in one week  Please request your primary care physician to go over all Hospital Tests and Procedure/Radiological results at the follow up,  Please get all Hospital records sent to your PCP by signing hospital release before you go home.   Do not take more than prescribed Pain, Sleep and Anxiety Medications. You were cared for by a hospitalist during your hospital stay. If you have any questions about your discharge medications or the care you received while you were in the hospital after you are discharged, you can call the unit and ask to speak with the hospitalist on call if the hospitalist that took care of you is not available.  Once you are discharged, your primary care physician will handle any further medical issues. Please  note that NO REFILLS for any discharge medications will be authorized once you are discharged, as it is imperative that you return to your primary care physician (or establish a relationship with a primary care physician if you do not have one) for your aftercare needs so that they can reassess your need for medications and monitor your lab values. You Must read complete instructions/literature along with all the possible adverse reactions/side effects for all the Medicines you take and that have been prescribed to you. Take any new Medicines after you have completely understood and accept all the possible adverse reactions/side effects. Wear Seat belts while driving. If you have smoked or chewed Tobacco in the last 2 yrs please  stop smoking and/or stop any Recreational drug use.   Increase activity slowly    Complete by:  As directed      Discharge Exam: Filed Weights   06/24/17 1028 06/24/17 2105 06/25/17 2156  Weight: (!) 182.3 kg (402 lb) (!) 174 kg (383 lb 9.6 oz) (!) 175.3 kg (386 lb 8 oz)   Vitals:   06/26/17 0745 06/26/17 1730  BP: (!) 133/51 132/64  Pulse: 96 97  Resp: 20 20  Temp: (!) 97.5 F (36.4 C) 97.9 F (36.6 C)   General: Appear in no distress, no Rash; Oral Mucosa moist. Cardiovascular: S1 and S2 Present, no Murmur, no JVD Respiratory: Bilateral Air entry present and Clear to Auscultation, no Crackles, no wheezes Abdomen: Bowel Sound present, Soft and no tenderness Extremities: no Pedal edema, no calf tenderness Neurology: Grossly no focal neuro deficit.  The results of significant diagnostics from this hospitalization (including imaging, microbiology, ancillary and laboratory) are listed below for reference.    Significant Diagnostic Studies: Dg Chest 2 View  Result Date: 06/24/2017 CLINICAL DATA:  Shortness of breath for several weeks. Worse over the last few hours. EXAM: CHEST  2 VIEW COMPARISON:  One-view chest x-ray 05/12/2011 FINDINGS: The heart is enlarged. A moderate-sized hiatal hernia is present. Moderate pulmonary vascular congestion is present. There is no frank edema. No significant airspace consolidation is present. There are no effusions. IMPRESSION: Stable cardiomegaly with moderate pulmonary vascular congestion. Electronically Signed   By: San Morelle M.D.   On: 06/24/2017 11:14   Ct Angio Chest Pe W And/or Wo Contrast  Result Date: 06/24/2017 CLINICAL DATA:  Dyspnea on exertion. Fatigue for the last few days. History of pulmonary embolus. EXAM: CT ANGIOGRAPHY CHEST WITH CONTRAST TECHNIQUE: Multidetector CT imaging of the chest was performed using the standard protocol during bolus administration of intravenous contrast. Multiplanar CT image reconstructions and  MIPs were obtained to evaluate the vascular anatomy. CONTRAST:  100 cc Isovue 370 COMPARISON:  Ventilation perfusion lung scan from 05/12/2011 FINDINGS: Cardiovascular: Wellness filling defect is identified in the pulmonary arterial tree, patient body habitus, motion artifact, and dilution of contrast results in significantly suboptimal contrast opacification reducing sensitivity of today' s exam in segmental and subsegmental pulmonary arterial branches. There is atherosclerotic calcification of the aortic arch. Mild cardiomegaly. Mediastinum/Nodes: New moderate to large type 1 hiatal hernia. No pathologic adenopathy in the chest. Lungs/Pleura: Subtle mosaic attenuation likely from air trapping. Otherwise unremarkable. Upper Abdomen: Low-density prominence of the left adrenal gland without overt mass. Musculoskeletal: Left greater than right degenerative glenohumeral arthropathy. Extensive thoracic spondylosis with bridging spurring. Review of the MIP images confirms the above findings. IMPRESSION: 1. No embolus identified; significantly reduced sensitivity for segmental and subsegmental emboli due to body habitus, motion artifact, and dilution of contrast bolus.  I do not feel in this case that repeat bolus would be helpful. 2. Moderate to large type 1 hiatal hernia. 3. Subtle mosaic attenuation in the lungs likely from mild air trapping. 4.  Aortic Atherosclerosis (ICD10-I70.0). Electronically Signed   By: Van Clines M.D.   On: 06/24/2017 13:46    Microbiology: No results found for this or any previous visit (from the past 240 hour(s)).   Labs: CBC:  Recent Labs Lab 06/24/17 1050 06/26/17 0441  WBC 5.8 4.8  NEUTROABS 4.3  --   HGB 9.6* 9.2*  HCT 33.7* 31.7*  MCV 78.4 78.5  PLT 345 379   Basic Metabolic Panel:  Recent Labs Lab 06/24/17 1126 06/26/17 0441  NA 139 138  K 3.9 3.4*  CL 105 104  CO2 26 25  GLUCOSE 117* 131*  BUN 5* 14  CREATININE 0.73 0.82  CALCIUM 8.7* 8.7*    Liver Function Tests:  Recent Labs Lab 06/24/17 1126  AST 19  ALT 13*  ALKPHOS 92  BILITOT 0.3  PROT 6.9  ALBUMIN 3.0*   No results for input(s): LIPASE, AMYLASE in the last 168 hours. No results for input(s): AMMONIA in the last 168 hours. Cardiac Enzymes: No results for input(s): CKTOTAL, CKMB, CKMBINDEX, TROPONINI in the last 168 hours. BNP (last 3 results)  Recent Labs  06/24/17 1050  BNP 23.3   CBG:  Recent Labs Lab 06/25/17 1644 06/25/17 2153 06/26/17 0820 06/26/17 1216 06/26/17 1701  GLUCAP 120* 161* 132* 132* 128*   Time spent: 35 minutes  Signed:  PATEL, PRANAV  [facility] 06/26/2017 , 4:30 PM

## 2017-07-01 ENCOUNTER — Telehealth: Payer: Self-pay | Admitting: Family Medicine

## 2017-07-01 ENCOUNTER — Ambulatory Visit (HOSPITAL_COMMUNITY)
Admission: RE | Admit: 2017-07-01 | Discharge: 2017-07-01 | Disposition: A | Discharge status: Home / Self Care | Payer: Medicare Other | Source: Ambulatory Visit | Attending: Family Medicine | Admitting: Family Medicine

## 2017-07-01 DIAGNOSIS — D509 Iron deficiency anemia, unspecified: Secondary | ICD-10-CM | POA: Insufficient documentation

## 2017-07-01 MED ORDER — SODIUM CHLORIDE 0.9 % IV SOLN
510.0000 mg | INTRAVENOUS | Status: DC
  Administered 2017-07-01: 510 mg via INTRAVENOUS
  Filled 2017-07-01: qty 17

## 2017-07-01 NOTE — Telephone Encounter (Signed)
Brenda ButcherKetha from [facility] called stating RN is going to visit tomorrow 07/24 to start home health.

## 2017-07-03 NOTE — Telephone Encounter (Signed)
[facility] requesting verbal orders to go out and see the pt.  Please f/u

## 2017-07-03 NOTE — Telephone Encounter (Signed)
Will route to PCP 

## 2017-07-03 NOTE — Telephone Encounter (Signed)
Pt was called and given verbal orders for home health.

## 2017-07-03 NOTE — Telephone Encounter (Signed)
Melissa from [facility] called requesting verbal orders for OT 1 week 1, 2 week 3, and 1 week 1.  She also states that she needs a shower chair. Please f/u

## 2017-07-08 ENCOUNTER — Encounter (HOSPITAL_COMMUNITY)
Admission: RE | Admit: 2017-07-08 | Discharge: 2017-07-08 | Disposition: A | Discharge status: Home / Self Care | Payer: Medicare Other | Source: Ambulatory Visit | Attending: Family Medicine | Admitting: Family Medicine

## 2017-07-08 DIAGNOSIS — D509 Iron deficiency anemia, unspecified: Secondary | ICD-10-CM | POA: Diagnosis present

## 2017-07-08 MED ORDER — SODIUM CHLORIDE 0.9 % IV SOLN
510.0000 mg | INTRAVENOUS | Status: AC
  Administered 2017-07-08: 510 mg via INTRAVENOUS
  Filled 2017-07-08: qty 17

## 2017-07-09 MED ORDER — ADJUST BATH/SHOWER SEAT MISC: 1.0000 | Freq: Every day | 0 refills | Status: DC | PRN

## 2017-07-09 MED ORDER — ADJUST BATH/SHOWER SEAT MISC: 1.0000 | Freq: Every day | 0 refills | Status: AC | PRN

## 2017-07-09 NOTE — Telephone Encounter (Signed)
Called to #s provided The [phone] # was the emergency line I left a voicemail at the other # [phone] Giving VO for OT Requested fax # for requested shower chair, Rx written

## 2017-07-17 ENCOUNTER — Inpatient Hospital Stay: Payer: Self-pay | Admitting: Critical Care Medicine

## 2017-07-31 ENCOUNTER — Ambulatory Visit: Payer: Medicare Other | Attending: Critical Care Medicine | Admitting: Critical Care Medicine

## 2017-07-31 ENCOUNTER — Encounter: Payer: Self-pay | Admitting: Critical Care Medicine

## 2017-07-31 ENCOUNTER — Other Ambulatory Visit: Payer: Self-pay | Admitting: Pharmacist

## 2017-07-31 VITALS — BP 129/84 | HR 88 | Temp 97.8°F | Resp 16 | Wt 388.8 lb

## 2017-07-31 DIAGNOSIS — I10 Essential (primary) hypertension: Secondary | ICD-10-CM

## 2017-07-31 DIAGNOSIS — Z7984 Long term (current) use of oral hypoglycemic drugs: Secondary | ICD-10-CM | POA: Insufficient documentation

## 2017-07-31 DIAGNOSIS — R0600 Dyspnea, unspecified: Secondary | ICD-10-CM | POA: Diagnosis present

## 2017-07-31 DIAGNOSIS — G4730 Sleep apnea, unspecified: Secondary | ICD-10-CM | POA: Diagnosis not present

## 2017-07-31 DIAGNOSIS — E119 Type 2 diabetes mellitus without complications: Secondary | ICD-10-CM

## 2017-07-31 DIAGNOSIS — E7800 Pure hypercholesterolemia, unspecified: Secondary | ICD-10-CM | POA: Diagnosis not present

## 2017-07-31 DIAGNOSIS — R0609 Other forms of dyspnea: Secondary | ICD-10-CM

## 2017-07-31 DIAGNOSIS — G4733 Obstructive sleep apnea (adult) (pediatric): Secondary | ICD-10-CM | POA: Diagnosis not present

## 2017-07-31 DIAGNOSIS — Z6841 Body Mass Index (BMI) 40.0 and over, adult: Secondary | ICD-10-CM | POA: Diagnosis not present

## 2017-07-31 DIAGNOSIS — K219 Gastro-esophageal reflux disease without esophagitis: Secondary | ICD-10-CM

## 2017-07-31 DIAGNOSIS — Z86718 Personal history of other venous thrombosis and embolism: Secondary | ICD-10-CM | POA: Insufficient documentation

## 2017-07-31 DIAGNOSIS — D509 Iron deficiency anemia, unspecified: Secondary | ICD-10-CM

## 2017-07-31 DIAGNOSIS — K449 Diaphragmatic hernia without obstruction or gangrene: Secondary | ICD-10-CM

## 2017-07-31 DIAGNOSIS — I5032 Chronic diastolic (congestive) heart failure: Secondary | ICD-10-CM | POA: Diagnosis not present

## 2017-07-31 DIAGNOSIS — I5033 Acute on chronic diastolic (congestive) heart failure: Secondary | ICD-10-CM | POA: Insufficient documentation

## 2017-07-31 DIAGNOSIS — I110 Hypertensive heart disease with heart failure: Secondary | ICD-10-CM | POA: Insufficient documentation

## 2017-07-31 DIAGNOSIS — J449 Chronic obstructive pulmonary disease, unspecified: Secondary | ICD-10-CM | POA: Diagnosis not present

## 2017-07-31 LAB — GLUCOSE, POCT (MANUAL RESULT ENTRY): POC Glucose: 132 mg/dl — AB (ref 70–99)

## 2017-07-31 MED ORDER — ALBUTEROL SULFATE HFA 108 (90 BASE) MCG/ACT IN AERS: 2.0000 | INHALATION_SPRAY | Freq: Four times a day (QID) | RESPIRATORY_TRACT | 2 refills | Status: DC | PRN

## 2017-07-31 MED ORDER — OMEPRAZOLE 20 MG PO CPDR: 40.0000 mg | DELAYED_RELEASE_CAPSULE | Freq: Every day | ORAL | 11 refills | Status: AC

## 2017-07-31 MED ORDER — ATORVASTATIN CALCIUM 40 MG PO TABS: 40.0000 mg | ORAL_TABLET | Freq: Every day | ORAL | 11 refills | Status: AC

## 2017-07-31 MED ORDER — GLUCOSE BLOOD VI STRP: 1.0000 | ORAL_STRIP | Freq: Two times a day (BID) | 12 refills | Status: AC

## 2017-07-31 MED ORDER — METFORMIN HCL ER 500 MG PO TB24: 500.0000 mg | ORAL_TABLET | Freq: Every day | ORAL | 3 refills | Status: AC

## 2017-07-31 MED ORDER — LOSARTAN POTASSIUM-HCTZ 50-12.5 MG PO TABS: 2.0000 | ORAL_TABLET | Freq: Every day | ORAL | 3 refills | Status: DC

## 2017-07-31 MED ORDER — FLUTICASONE PROPIONATE 50 MCG/ACT NA SUSP: 1.0000 | Freq: Every day | NASAL | 6 refills | Status: AC

## 2017-07-31 MED ORDER — FERROUS SULFATE 325 (65 FE) MG PO TABS: 325.0000 mg | ORAL_TABLET | Freq: Two times a day (BID) | ORAL | 3 refills | Status: AC

## 2017-07-31 MED ORDER — FUROSEMIDE 20 MG PO TABS: 20.0000 mg | ORAL_TABLET | Freq: Every day | ORAL | 0 refills | Status: DC | PRN

## 2017-07-31 MED ORDER — PROAIR HFA 108 (90 BASE) MCG/ACT IN AERS: 2.0000 | INHALATION_SPRAY | Freq: Four times a day (QID) | RESPIRATORY_TRACT | 2 refills | Status: AC | PRN

## 2017-07-31 NOTE — Assessment & Plan Note (Signed)
Hx of acute on chronic diastolic HF now chronic Plan Prn lasix BP control with Hyzaar Follow weights Salt restriction

## 2017-07-31 NOTE — Assessment & Plan Note (Signed)
DM 2 controlled but needs weight loss focus Plan Cont current metformin medications Refilled DM strips

## 2017-07-31 NOTE — Assessment & Plan Note (Signed)
HTN stable on current program Continue current medications

## 2017-07-31 NOTE — Progress Notes (Signed)
Subjective:    Patient ID: Brenda Clark, female    DOB: 1951/04/13, 66 y.o.   MRN: [id number]  66 y.o.F recently d/c from hospital for dyspnea found chronic diastolic HF, OSA, Obesity, multifactorial. Here for post hospital f/u.    Shortness of Breath  This is a recurrent problem. The current episode started more than 1 year ago. The problem occurs daily (worse with low iron levels,  mostly with exertion). The problem has been gradually improving. Associated symptoms include headaches, leg pain, leg swelling and orthopnea. Pertinent negatives include no chest pain, fever, hemoptysis, PND, rhinorrhea, sore throat, sputum production, syncope or wheezing. The symptoms are aggravated by any activity and fumes. She has tried nothing for the symptoms. Her past medical history is significant for COPD and a heart failure. There is no history of asthma, CAD, PE or pneumonia.  From hospital stay:  7/16 - 7/18 DOE -Likely multifactorial in the setting of obstructive sleep apnea, obesity hypoventilation syndrome, morbid obesity, concern for acute on chronic diastolic CHF -Discussed weight as contributing factor.  Acute on chronic diastolic Congestive Heart Failure -Echocardiogram Shows preserved EF, no comments on diastolic dysfunction due to poor windows. -Received 1 dose of Lasix in the ED, repeat dose of IV Lasix Was discharged on when necessary Lasix.  Morbid Obesity -Counseled on diet and exercise.  OSA, controlled with CPAP  -Patient reports compliance with CPAP.   HTN -Continue home regimen  Anemia, iron deficiency Hemoglobin stable. Continue to monitor as an outpatient.  DM2 Continue home regimen Arthritis -Tramadol prn  History of DVT / PE -CTA chest negative for PE  All other chronic medical condition were stable during the hospitalization.  Patient was seen by physical therapy, who recommended home health, which was arranged by Child psychotherapist and case Production designer, theatre/television/film. On  the day of the discharge the patient's vitals were stable, and no other acute medical condition were reported by patient. the patient was felt safe to be discharge at home with home health.    Review of Systems  Constitutional: Negative for fever.  HENT: Negative for rhinorrhea and sore throat.   Respiratory: Positive for shortness of breath. Negative for hemoptysis, sputum production and wheezing.   Cardiovascular: Positive for orthopnea and leg swelling. Negative for chest pain, syncope and PND.  Neurological: Positive for headaches.       Objective:   Physical Exam Vitals:   07/31/17 0918  BP: 129/84  Pulse: 88  Resp: 16  Temp: 97.8 F (36.6 C)  TempSrc: Oral  SpO2: 96%  Weight: (!) 388 lb 12.8 oz (176.4 kg)    Gen: Pleasant, obese F in NAD  ENT: No lesions,  mouth clear,  oropharynx clear, no postnasal drip  Neck: No JVD, no TMG, no carotid bruits  Lungs: No use of accessory muscles, no dullness to percussion, clear without rales or rhonchi  Cardiovascular: RRR, heart sounds normal, no murmur or gallops, no peripheral edema  Abdomen: soft and NT, no HSM,  BS normal  Musculoskeletal: No deformities, no cyanosis or clubbing  Neuro: alert, non focal  Skin: Warm, no lesions or rashes  No results found.  Ct Chest reviewed All labs from hosp stay reviewed      Assessment & Plan:  I personally reviewed all images and lab data in the [facility] system as well as any outside material available during this office visit and agree with the  radiology impressions.   HTN (hypertension) HTN stable on current program Continue current  medications  Diabetes type 2, controlled (HCC) DM 2 controlled but needs weight loss focus Plan Cont current metformin medications Refilled DM strips  Sleep apnea with use of continuous positive airway pressure (CPAP) On CPAP OSA well controled Plan Cont cpap  Hiatal hernia GERD with HH Plan  Cont omeprazole  MORBID  OBESITY Morbid obesity Referral to DM nutrition center  DOE (dyspnea on exertion) DOE on basis of chronic diastolic HF , note air trapping on CT Chest   NO PE Plan Prn albuterol Obtain full PFTs  Chronic diastolic heart failure (HCC) Hx of acute on chronic diastolic HF now chronic Plan Prn lasix BP control with Hyzaar Follow weights Salt restriction    Laurice was seen today for hospitalization follow-up.  Diagnoses and all orders for this visit:  Dyspnea on exertion -     Pulmonary Function Test; Future  Controlled type 2 diabetes mellitus without complication, without long-term current use of insulin (HCC) -     POCT glucose (manual entry) -     metFORMIN (GLUCOPHAGE-XR) 500 MG 24 hr tablet; Take 1 tablet (500 mg total) by mouth daily with breakfast. -     glucose blood (ACCU-CHEK AVIVA PLUS) test strip; 1 each by Other route 2 (two) times daily. -     Ambulatory referral to diabetic education  Gastroesophageal reflux disease without esophagitis -     omeprazole (PRILOSEC) 20 MG capsule; Take 2 capsules (40 mg total) by mouth daily.  Hypertension, unspecified type -     losartan-hydrochlorothiazide (HYZAAR) 50-12.5 MG tablet; Take 2 tablets by mouth daily. -     Ambulatory referral to diabetic education  Iron deficiency anemia, unspecified iron deficiency anemia type -     ferrous sulfate 325 (65 FE) MG tablet; Take 1 tablet (325 mg total) by mouth 2 (two) times daily with a meal.  Pure hypercholesterolemia -     atorvastatin (LIPITOR) 40 MG tablet; Take 1 tablet (40 mg total) by mouth daily.  Morbid obesity (HCC) -     Ambulatory referral to diabetic education  Sleep apnea with use of continuous positive airway pressure (CPAP)  Hiatal hernia  DOE (dyspnea on exertion)  Chronic diastolic heart failure (HCC)  Other orders -     furosemide (LASIX) 20 MG tablet; Take 1 tablet (20 mg total) by mouth daily as needed for fluid or edema (Weight gain of more than 3  lbs in one day or 5 lbs in 2 days.). -     fluticasone (FLONASE) 50 MCG/ACT nasal spray; Place 1 spray into both nostrils daily. -     albuterol (PROVENTIL HFA;VENTOLIN HFA) 108 (90 Base) MCG/ACT inhaler; Inhale 2 puffs into the lungs every 6 (six) hours as needed for wheezing or shortness of breath.

## 2017-07-31 NOTE — Assessment & Plan Note (Signed)
DOE on basis of chronic diastolic HF , note air trapping on CT Chest   NO PE Plan Prn albuterol Obtain full PFTs

## 2017-07-31 NOTE — Assessment & Plan Note (Signed)
Morbid obesity Referral to DM nutrition center

## 2017-07-31 NOTE — Patient Instructions (Signed)
New diabeticstrips sent Refills on all medications sent Use albuterol as needed A lung function test will be obtained A nutrition consult will be obtained A new primary care MD will be obtained Stay on CPAP device

## 2017-07-31 NOTE — Progress Notes (Signed)
Pt is requesting a rx for a toilet chair

## 2017-07-31 NOTE — Assessment & Plan Note (Signed)
GERD with HH Plan  Cont omeprazole

## 2017-07-31 NOTE — Assessment & Plan Note (Signed)
On CPAP OSA well controled Plan Cont cpap

## 2017-08-15 ENCOUNTER — Telehealth: Payer: Self-pay | Admitting: Family Medicine

## 2017-08-15 NOTE — Telephone Encounter (Signed)
[facility] and was informed of patient not requiring a prior authorization. Patient should contact [employer] and ask for a specific breast center that is in network.

## 2017-08-15 NOTE — Telephone Encounter (Signed)
[facility] requesting [facility] for [facility] pt's plan does not cover and needs authorization . Please f/up

## 2017-08-26 ENCOUNTER — Ambulatory Visit: Payer: Self-pay | Admitting: Registered"

## 2017-09-25 ENCOUNTER — Ambulatory Visit: Payer: Medicare Other | Attending: Critical Care Medicine | Admitting: Critical Care Medicine

## 2017-09-25 ENCOUNTER — Encounter: Payer: Self-pay | Admitting: Critical Care Medicine

## 2017-09-25 VITALS — BP 129/76 | HR 76 | Temp 97.6°F | Resp 18 | Ht 64.0 in | Wt 395.4 lb

## 2017-09-25 DIAGNOSIS — G8929 Other chronic pain: Secondary | ICD-10-CM | POA: Diagnosis not present

## 2017-09-25 DIAGNOSIS — Z86711 Personal history of pulmonary embolism: Secondary | ICD-10-CM | POA: Insufficient documentation

## 2017-09-25 DIAGNOSIS — M1990 Unspecified osteoarthritis, unspecified site: Secondary | ICD-10-CM | POA: Insufficient documentation

## 2017-09-25 DIAGNOSIS — Z803 Family history of malignant neoplasm of breast: Secondary | ICD-10-CM | POA: Diagnosis not present

## 2017-09-25 DIAGNOSIS — E785 Hyperlipidemia, unspecified: Secondary | ICD-10-CM | POA: Diagnosis not present

## 2017-09-25 DIAGNOSIS — I10 Essential (primary) hypertension: Secondary | ICD-10-CM

## 2017-09-25 DIAGNOSIS — Z7984 Long term (current) use of oral hypoglycemic drugs: Secondary | ICD-10-CM | POA: Insufficient documentation

## 2017-09-25 DIAGNOSIS — I110 Hypertensive heart disease with heart failure: Secondary | ICD-10-CM | POA: Diagnosis not present

## 2017-09-25 DIAGNOSIS — Z79899 Other long term (current) drug therapy: Secondary | ICD-10-CM | POA: Insufficient documentation

## 2017-09-25 DIAGNOSIS — N840 Polyp of corpus uteri: Secondary | ICD-10-CM | POA: Insufficient documentation

## 2017-09-25 DIAGNOSIS — E662 Morbid (severe) obesity with alveolar hypoventilation: Secondary | ICD-10-CM | POA: Diagnosis not present

## 2017-09-25 DIAGNOSIS — K219 Gastro-esophageal reflux disease without esophagitis: Secondary | ICD-10-CM | POA: Diagnosis not present

## 2017-09-25 DIAGNOSIS — Z800 Family history of malignant neoplasm of digestive organs: Secondary | ICD-10-CM | POA: Insufficient documentation

## 2017-09-25 DIAGNOSIS — Z23 Encounter for immunization: Secondary | ICD-10-CM | POA: Diagnosis not present

## 2017-09-25 DIAGNOSIS — K449 Diaphragmatic hernia without obstruction or gangrene: Secondary | ICD-10-CM | POA: Insufficient documentation

## 2017-09-25 DIAGNOSIS — Z79891 Long term (current) use of opiate analgesic: Secondary | ICD-10-CM | POA: Insufficient documentation

## 2017-09-25 DIAGNOSIS — I5032 Chronic diastolic (congestive) heart failure: Secondary | ICD-10-CM | POA: Diagnosis not present

## 2017-09-25 DIAGNOSIS — E118 Type 2 diabetes mellitus with unspecified complications: Secondary | ICD-10-CM | POA: Insufficient documentation

## 2017-09-25 DIAGNOSIS — J449 Chronic obstructive pulmonary disease, unspecified: Secondary | ICD-10-CM | POA: Insufficient documentation

## 2017-09-25 DIAGNOSIS — Z833 Family history of diabetes mellitus: Secondary | ICD-10-CM | POA: Insufficient documentation

## 2017-09-25 DIAGNOSIS — R0602 Shortness of breath: Secondary | ICD-10-CM | POA: Diagnosis not present

## 2017-09-25 LAB — POCT GLYCOSYLATED HEMOGLOBIN (HGB A1C): HEMOGLOBIN A1C: 6.6

## 2017-09-25 LAB — GLUCOSE, POCT (MANUAL RESULT ENTRY): POC Glucose: 115 mg/dl — AB (ref 70–99)

## 2017-09-25 MED ORDER — NITROGLYCERIN 0.4 MG SL SUBL: 0.4000 mg | SUBLINGUAL_TABLET | SUBLINGUAL | 6 refills | Status: AC | PRN

## 2017-09-25 MED ORDER — DICLOFENAC SODIUM 1 % TD GEL: 2.0000 g | Freq: Four times a day (QID) | TRANSDERMAL | 3 refills | Status: AC | PRN

## 2017-09-25 MED ORDER — ZOSTER VACCINE LIVE 19400 UNT/0.65ML ~~LOC~~ SUSR: 0.6500 mL | Freq: Once | SUBCUTANEOUS | 0 refills | Status: AC

## 2017-09-25 NOTE — Patient Instructions (Signed)
Pneumonia and flu shot was given No change in medications A new primary care MD will be assigned Return 4 months with Primary care  A prescription for shingles vaccine was given, you can go to any pharmacy for this

## 2017-09-25 NOTE — Progress Notes (Signed)
[name] is here for f/up   Patient needs refill on nitroglycerin & votex

## 2017-09-25 NOTE — Progress Notes (Signed)
Subjective:    Patient ID: Brenda Clark, female    DOB: [birth year], 66 y.o.   MRN: [id number]  66 y.o.F recently d/c from hospital for dyspnea found chronic diastolic HF, OSA, Obesity, multifactorial. Here for f/u.  Overall is better.  Using inhalers.      Shortness of Breath  This is a recurrent problem. The current episode started more than 1 year ago. The problem occurs daily (worse with low iron levels,  mostly with exertion). The problem has been rapidly improving. Associated symptoms include leg pain and leg swelling. Pertinent negatives include no chest pain, fever, headaches, hemoptysis, orthopnea, PND, rhinorrhea, sore throat, sputum production, syncope or wheezing. The symptoms are aggravated by any activity and fumes. She has tried beta agonist inhalers for the symptoms. The treatment provided moderate relief. Her past medical history is significant for COPD and a heart failure. There is no history of asthma, CAD, PE or pneumonia.   Past Medical History:  Diagnosis Date  . Arthritis    DDD and HNP on MRI in 2008  . 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 Chronic pain   . Endometrial polyp 08/2010   biopsied.   Marland Kitchen GERD (gastroesophageal reflux disease)   . Headache, migraine   . Hiatal hernia 03/05/2014  . Hyperlipidemia   . Iron deficiency anemia, unspecified    ice pica  . Migraine, unspecified, without mention of intractable migraine without mention of status migrainosus   . Morbid obesity ([place])   . OSA (obstructive sleep apnea)   . Osteoarthrosis, unspecified whether generalized or localized, lower leg   . Other and unspecified hyperlipidemia   . Pulmonary embolus ([place]) 2012  . Retinal detachment   . Type II or unspecified type diabetes mellitus without mention of complication, not stated as uncontrolled   . Unspecified essential hypertension      Family History  Problem Relation Age of Onset  . Colon cancer Father   . Breast cancer Sister   . Diabetes Sister       Social History   Social History  . Marital status: Widowed    Spouse name: N/A  . Number of children: 2  . Years of education: HS   Occupational History  . DISABLED Disabled    since 2007   Social History Main Topics  . Smoking status: Never Smoker  . Smokeless tobacco: Never Used  . Alcohol use No  . Drug use: No  . Sexual activity: Not on file   Other Topics Concern  . Not on file   Social History Narrative   Patient with obesity, hyperventilation and obstructive apneas, successfully treated with CPAP at 10 cm water. Good residual AHI values for the last year and low Epworth score. Compliance is good. [facility] follows her.    Her GERD is better on omeprazole and this has further reduced her nocturnal awakenings. She is still hypertensive and needs urgently to lose weight, this would positivley affect her diabetes as well.       I convinced her to take her Topamax and take her Lasix in the AM at 20 mg. She was advised of low carb diet and potassium rich foods.       Patient lives at home alone.   Caffeine Use: occasionally     No Known Allergies   Outpatient Medications Prior to Visit  Medication Sig Dispense Refill  . acetaminophen (TYLENOL) 500 MG tablet Take 2 tablets (1,000 mg total) by mouth every 8 (eight) hours  as needed. 180 tablet 5  . atorvastatin (LIPITOR) 40 MG tablet Take 1 tablet (40 mg total) by mouth daily. 30 tablet 11  . ferrous sulfate 325 (65 FE) MG tablet Take 1 tablet (325 mg total) by mouth 2 (two) times daily with a meal. 180 tablet 3  . fluticasone (FLONASE) 50 MCG/ACT nasal spray Place 1 spray into both nostrils daily. 16 g 6  . furosemide (LASIX) 20 MG tablet Take 1 tablet (20 mg total) by mouth daily as needed for fluid or edema (Weight gain of more than 3 lbs in one day or 5 lbs in 2 days.). 30 tablet 0  . glucose blood (ACCU-CHEK AVIVA PLUS) test strip 1 each by Other route 2 (two) times daily. 100 each 12  . losartan-hydrochlorothiazide  (HYZAAR) 50-12.5 MG tablet Take 2 tablets by mouth daily. 180 tablet 3  . metFORMIN (GLUCOPHAGE-XR) 500 MG 24 hr tablet Take 1 tablet (500 mg total) by mouth daily with breakfast. 90 tablet 3  . Misc. Devices (ADJUST BATH/SHOWER SEAT) MISC 1 each by Does not apply route daily as needed. bariatric shower chair weight limit 500 lbs 1 each 0  . Misc. Devices ([place]) MISC Walker with seat 1 each 0  . omeprazole (PRILOSEC) 20 MG capsule Take 2 capsules (40 mg total) by mouth daily. 60 capsule 11  . PROAIR HFA 108 (90 Base) MCG/ACT inhaler Inhale 2 puffs into the lungs every 6 (six) hours as needed for wheezing or shortness of breath. 1 Inhaler 2  . traMADol (ULTRAM) 50 MG tablet Take 1 tablet (50 mg total) by mouth 3 (three) times daily. 90 tablet 2  . nitroGLYCERIN (NITROSTAT) 0.4 MG SL tablet Place 0.4 mg under the tongue every 5 (five) minutes as needed. Reported on 01/19/2016    . Blood Glucose Monitoring Suppl (ACCU-CHEK AVIVA PLUS) w/Device KIT 1 Device by Does not apply route 2 (two) times daily. (Patient not taking: Reported on 07/31/2017) 1 kit 0   No facility-administered medications prior to visit.     Review of Systems  Constitutional: Negative for fever.  HENT: Negative for rhinorrhea and sore throat.   Respiratory: Positive for shortness of breath. Negative for hemoptysis, sputum production and wheezing.   Cardiovascular: Positive for leg swelling. Negative for chest pain, orthopnea, syncope and PND.  Neurological: Negative for headaches.       Objective:   Physical Exam  Vitals:   09/25/17 0921  BP: 129/76  Pulse: 76  Resp: 18  Temp: 97.6 F (36.4 C)  TempSrc: Oral  SpO2: 99%  Weight: (!) 395 lb 6.4 oz (179.4 kg)  Height: _0  (1.626 m)    Gen: Pleasant, obese F in NAD  ENT: No lesions,  mouth clear,  oropharynx clear, no postnasal drip  Neck: No JVD, no TMG, no carotid bruits  Lungs: No use of accessory muscles, no dullness to percussion, clear  without rales or rhonchi  Cardiovascular: RRR, heart sounds normal, no murmur or gallops, no peripheral edema  Abdomen: soft and NT, no HSM,  BS normal  Musculoskeletal: No deformities, no cyanosis or clubbing  Neuro: alert, non focal  Skin: Warm, no lesions or rashes  No results found.      Assessment & Plan:  I personally reviewed all images and lab data in the [facility] system as well as any outside material available during this office visit and agree with the  radiology impressions.   Chronic diastolic heart failure (HCC) Stable CHF  Plan  No change in CHF program  HTN (hypertension) Stable HTN   Brenda Clark was seen today for follow-up.  Diagnoses and all orders for this visit:  Diabetes mellitus with complication ([place]) -     Glucose (CBG) -     HgB D4J  Chronic diastolic heart failure (HCC)  Obesity hypoventilation syndrome (HCC)  Needs flu shot -     Flu Vaccine QUAD 6+ mos PF IM (Fluarix Quad PF)  Essential hypertension  Other orders -     diclofenac sodium (VOLTAREN) 1 % GEL; Apply 2 g topically 4 (four) times daily as needed. -     nitroGLYCERIN (NITROSTAT) 0.4 MG SL tablet; Place 1 tablet (0.4 mg total) under the tongue every 5 (five) minutes as needed. Reported on 01/19/2016 -     Cancel: Varicella-zoster vaccine subcutaneous -     Zoster Vaccine Live, PF, (ZOSTAVAX) 09295 UNT/0.65ML injection; Inject 19,400 Units into the skin once. -     Pneumococcal conjugate vaccine 13-valent

## 2017-09-25 NOTE — Assessment & Plan Note (Signed)
Stable CHF  Plan  No change in CHF program

## 2017-09-25 NOTE — Assessment & Plan Note (Signed)
Stable HTN

## 2017-10-01 ENCOUNTER — Encounter: Payer: Self-pay | Admitting: Pharmacist

## 2017-10-01 NOTE — Progress Notes (Addendum)
Prior authorization completed through [employer] for Voltaren Gel. Approved through 12/09/2018

## 2018-03-05 ENCOUNTER — Ambulatory Visit: Payer: Medicare Other | Attending: Nurse Practitioner | Admitting: Nurse Practitioner

## 2018-03-05 ENCOUNTER — Encounter: Payer: Self-pay | Admitting: Nurse Practitioner

## 2018-03-05 VITALS — BP 131/82 | HR 71 | Temp 97.5°F | Ht 64.0 in | Wt >= 6400 oz

## 2018-03-05 DIAGNOSIS — K219 Gastro-esophageal reflux disease without esophagitis: Secondary | ICD-10-CM | POA: Insufficient documentation

## 2018-03-05 DIAGNOSIS — Z800 Family history of malignant neoplasm of digestive organs: Secondary | ICD-10-CM | POA: Diagnosis not present

## 2018-03-05 DIAGNOSIS — Z79899 Other long term (current) drug therapy: Secondary | ICD-10-CM | POA: Diagnosis not present

## 2018-03-05 DIAGNOSIS — Z79891 Long term (current) use of opiate analgesic: Secondary | ICD-10-CM | POA: Insufficient documentation

## 2018-03-05 DIAGNOSIS — Z86711 Personal history of pulmonary embolism: Secondary | ICD-10-CM | POA: Diagnosis not present

## 2018-03-05 DIAGNOSIS — Z833 Family history of diabetes mellitus: Secondary | ICD-10-CM | POA: Diagnosis not present

## 2018-03-05 DIAGNOSIS — E118 Type 2 diabetes mellitus with unspecified complications: Secondary | ICD-10-CM | POA: Diagnosis not present

## 2018-03-05 DIAGNOSIS — Z9889 Other specified postprocedural states: Secondary | ICD-10-CM | POA: Diagnosis not present

## 2018-03-05 DIAGNOSIS — G8929 Other chronic pain: Secondary | ICD-10-CM | POA: Insufficient documentation

## 2018-03-05 DIAGNOSIS — Z780 Asymptomatic menopausal state: Secondary | ICD-10-CM

## 2018-03-05 DIAGNOSIS — E662 Morbid (severe) obesity with alveolar hypoventilation: Secondary | ICD-10-CM | POA: Diagnosis not present

## 2018-03-05 DIAGNOSIS — I5032 Chronic diastolic (congestive) heart failure: Secondary | ICD-10-CM | POA: Insufficient documentation

## 2018-03-05 DIAGNOSIS — K449 Diaphragmatic hernia without obstruction or gangrene: Secondary | ICD-10-CM | POA: Diagnosis not present

## 2018-03-05 DIAGNOSIS — Z7984 Long term (current) use of oral hypoglycemic drugs: Secondary | ICD-10-CM | POA: Insufficient documentation

## 2018-03-05 DIAGNOSIS — M1990 Unspecified osteoarthritis, unspecified site: Secondary | ICD-10-CM | POA: Diagnosis not present

## 2018-03-05 DIAGNOSIS — I10 Essential (primary) hypertension: Secondary | ICD-10-CM | POA: Insufficient documentation

## 2018-03-05 DIAGNOSIS — E785 Hyperlipidemia, unspecified: Secondary | ICD-10-CM | POA: Diagnosis not present

## 2018-03-05 DIAGNOSIS — E11649 Type 2 diabetes mellitus with hypoglycemia without coma: Secondary | ICD-10-CM | POA: Insufficient documentation

## 2018-03-05 DIAGNOSIS — E2839 Other primary ovarian failure: Secondary | ICD-10-CM

## 2018-03-05 DIAGNOSIS — N840 Polyp of corpus uteri: Secondary | ICD-10-CM | POA: Diagnosis not present

## 2018-03-05 DIAGNOSIS — Z1231 Encounter for screening mammogram for malignant neoplasm of breast: Secondary | ICD-10-CM

## 2018-03-05 DIAGNOSIS — Z803 Family history of malignant neoplasm of breast: Secondary | ICD-10-CM | POA: Insufficient documentation

## 2018-03-05 DIAGNOSIS — Z6841 Body Mass Index (BMI) 40.0 and over, adult: Secondary | ICD-10-CM | POA: Insufficient documentation

## 2018-03-05 LAB — GLUCOSE, POCT (MANUAL RESULT ENTRY): POC Glucose: 102 mg/dl — AB (ref 70–99)

## 2018-03-05 NOTE — Progress Notes (Signed)
Assessment & Plan:  Brenda Clark was seen today for establish care and arthritis.  Diagnoses and all orders for this visit:  Essential hypertension -     losartan-hydrochlorothiazide (HYZAAR) 50-12.5 MG tablet; Take 2 tablets by mouth daily. Continue all antihypertensives as prescribed.  Remember to bring in your blood pressure log with you for your follow up appointment.  DASH/Mediterranean Diets are healthier choices for HTN.   Diabetes mellitus with complication (HCC) -     Glucose (CBG) -     Ambulatory referral to Ophthalmology Continue blood sugar control as discussed in office today, low carbohydrate diet, and regular physical exercise as tolerated, 150 minutes per week (30 min each day, 5 days per week, or 50 min 3 days per week). Keep blood sugar logs with fasting goal of 80-130 mg/dl, post prandial less than 180.  For Hypoglycemia: BS <60 and Hyperglycemia BS >400; contact the clinic ASAP. Annual eye exams and foot exams are recommended.   Chronic diastolic heart failure (HCC) -     Brain natriuretic peptide -     CMP14+EGFR MAINTAIN A SALT RESTRICTED DIET  Obesity hypoventilation syndrome (HCC) Continue CPAP as prescribed.   Breast cancer screening by mammogram -     MM SCREENING BREAST TOMO BILATERAL; Future  Post-menopause -     DG Bone Density; Future   Patient has been counseled on age-appropriate routine health concerns for screening and prevention. These are reviewed and up-to-date. Referrals have been placed accordingly. Immunizations are up-to-date or declined.    Subjective:   Chief Complaint  Patient presents with  . Establish Care    Pt is here to establish care for diabetes and arthritis.   . Arthritis    Pt. stated the pain medication do not help her with her arthriitis pain. Pain are all over arms and legs.    HPI Brenda Clark 67 y.o. female presents to office today to establish care. She has a history of HTN, DM Type 2, and CHF.   CHF  Hx of  chronic diastolic heart failure. She is chronically short of breath. Taking increased amounts of lasix due to BLE edema. We had a long discussion regarding her diet which is likely related to her BLE edema. She is still adding salt to her foods. Eating pork sausage every morning. She is up 30lbs from 5 months ago.   DM TYPE 2 Chronic. Stable and well controlled. Taking metformin 500 mg daily. She is checking her blood sugars twice a day: Fasting 110s; Postprandial 130s.  She denies any hypo-or hyperglycemic symptoms. Overdue for eye exam.  Will place referral today. Lab Results  Component Value Date   HGBA1C 6.6 09/25/2017    OSA  Symptoms well controlled. Using CPAP nightly as prescribed.   Essential Hypertension Chronic. Well controlled. Endorses medication compliance.  Taking Hyzaar 50-12.5 daily. Denies chest pain, palpitations, lightheadedness, dizziness, headaches or visual disturbances.  BP Readings from Last 3 Encounters:  03/05/18 131/82  09/25/17 129/76  07/31/17 129/84   Review of Systems  Constitutional: Negative for fever, malaise/fatigue and weight loss.  HENT: Negative.  Negative for nosebleeds.   Eyes: Negative.  Negative for blurred vision, double vision and photophobia.  Respiratory: Negative.  Negative for cough and shortness of breath.   Cardiovascular: Positive for leg swelling. Negative for chest pain and palpitations.  Gastrointestinal: Negative.  Negative for heartburn, nausea and vomiting.  Musculoskeletal: Positive for back pain, myalgias and neck pain. Negative for falls.  Neurological: Negative.  Negative for dizziness, focal weakness, seizures and headaches.  Psychiatric/Behavioral: Negative.  Negative for suicidal ideas.    Past Medical History:  Diagnosis Date  . Arthritis    DDD and HNP on MRI in 2008  . 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 Chronic pain   . Endometrial polyp 08/2010   biopsied.   Marland Kitchen GERD (gastroesophageal reflux disease)   . Headache,  migraine   . Hiatal hernia 03/05/2014  . Hyperlipidemia   . Iron deficiency anemia, unspecified    ice pica  . Migraine, unspecified, without mention of intractable migraine without mention of status migrainosus   . Morbid obesity ([place])   . OSA (obstructive sleep apnea)   . Osteoarthrosis, unspecified whether generalized or localized, lower leg   . Other and unspecified hyperlipidemia   . Pulmonary embolus ([place]) 2012  . Retinal detachment   . Type II or unspecified type diabetes mellitus without mention of complication, not stated as uncontrolled   . Unspecified essential hypertension     Past Surgical History:  Procedure Laterality Date  . CATARACT EXTRACTION    . COLONOSCOPY WITH PROPOFOL N/A 03/05/2014   Procedure: COLONOSCOPY WITH PROPOFOL;  Surgeon: Gatha Mayer, MD;  Location: [facility];  Service: Endoscopy;  Laterality: N/A;  . ESOPHAGOGASTRODUODENOSCOPY (EGD) WITH PROPOFOL N/A 03/05/2014   Procedure: ESOPHAGOGASTRODUODENOSCOPY (EGD) WITH PROPOFOL;  Surgeon: Gatha Mayer, MD;  Location: [facility];  Service: Endoscopy;  Laterality: N/A;  . LUMBAR [place]    . TUBAL LIGATION      Family History  Problem Relation Age of Onset  . Colon cancer Father   . Breast cancer Sister   . Diabetes Sister   . Heart failure Sister     Social History Reviewed with no changes to be made today.   Outpatient Medications Prior to Visit  Medication Sig Dispense Refill  . acetaminophen (TYLENOL) 500 MG tablet Take 2 tablets (1,000 mg total) by mouth every 8 (eight) hours as needed. 180 tablet 5  . atorvastatin (LIPITOR) 40 MG tablet Take 1 tablet (40 mg total) by mouth daily. 30 tablet 11  . diclofenac sodium (VOLTAREN) 1 % GEL Apply 2 g topically 4 (four) times daily as needed. 1 Tube 3  . ferrous sulfate 325 (65 FE) MG tablet Take 1 tablet (325 mg total) by mouth 2 (two) times daily with a meal. 180 tablet 3  . furosemide (LASIX) 20 MG tablet Take 1 tablet (20 mg total) by  mouth daily as needed for fluid or edema (Weight gain of more than 3 lbs in one day or 5 lbs in 2 days.). 30 tablet 0  . metFORMIN (GLUCOPHAGE-XR) 500 MG 24 hr tablet Take 1 tablet (500 mg total) by mouth daily with breakfast. 90 tablet 3  . Misc. Devices (ADJUST BATH/SHOWER SEAT) MISC 1 each by Does not apply route daily as needed. bariatric shower chair weight limit 500 lbs 1 each 0  . Misc. Devices ([place]) MISC Walker with seat 1 each 0  . nitroGLYCERIN (NITROSTAT) 0.4 MG SL tablet Place 1 tablet (0.4 mg total) under the tongue every 5 (five) minutes as needed. Reported on 01/19/2016 30 tablet 6  . omeprazole (PRILOSEC) 20 MG capsule Take 2 capsules (40 mg total) by mouth daily. 60 capsule 11  . PROAIR HFA 108 (90 Base) MCG/ACT inhaler Inhale 2 puffs into the lungs every 6 (six) hours as needed for wheezing or shortness of breath. 1 Inhaler 2  . traMADol (ULTRAM)  50 MG tablet Take 1 tablet (50 mg total) by mouth 3 (three) times daily. 90 tablet 2  . losartan-hydrochlorothiazide (HYZAAR) 50-12.5 MG tablet Take 2 tablets by mouth daily. 180 tablet 3  . Blood Glucose Monitoring Suppl (ACCU-CHEK AVIVA PLUS) w/Device KIT 1 Device by Does not apply route 2 (two) times daily. (Patient not taking: Reported on 07/31/2017) 1 kit 0  . fluticasone (FLONASE) 50 MCG/ACT nasal spray Place 1 spray into both nostrils daily. (Patient not taking: Reported on 03/05/2018) 16 g 6  . glucose blood (ACCU-CHEK AVIVA PLUS) test strip 1 each by Other route 2 (two) times daily. 100 each 12   No facility-administered medications prior to visit.     No Known Allergies     Objective:    BP 131/82 (BP Location: Left Arm, Patient Position: Sitting, Cuff Size: Large) Comment (Cuff Size): thigh cuff  Pulse 71   Temp (!) 97.5 F (36.4 C) (Oral)   Ht '5\' 4"'  (1.626 m)   Wt (!) 425 lb 12.8 oz (193.1 kg)   SpO2 100%   BMI 73.09 kg/m  Wt Readings from Last 3 Encounters:  03/05/18 (!) 425 lb 12.8 oz (193.1  kg)  09/25/17 (!) 395 lb 6.4 oz (179.4 kg)  07/31/17 (!) 388 lb 12.8 oz (176.4 kg)    Physical Exam  Constitutional: She is oriented to person, place, and time. She appears well-developed and well-nourished. She is cooperative.  HENT:  Head: Normocephalic and atraumatic.  Eyes: EOM are normal.  Neck: Normal range of motion.  Cardiovascular: Normal rate, regular rhythm, normal heart sounds and intact distal pulses. Exam reveals no gallop and no friction rub.  No murmur heard. Pulmonary/Chest: Effort normal and breath sounds normal. No tachypnea. No respiratory distress. She has no decreased breath sounds. She has no wheezes. She has no rhonchi. She has no rales. She exhibits no tenderness.  Abdominal: Soft. Bowel sounds are normal.  Musculoskeletal: Normal range of motion. She exhibits edema (BLE nonpitting).  Neurological: She is alert and oriented to person, place, and time. Coordination normal.  Skin: Skin is warm and dry.  Psychiatric: She has a normal mood and affect. Her behavior is normal. Judgment and thought content normal.  Nursing note and vitals reviewed.      Patient has been counseled extensively about nutrition and exercise as well as the importance of adherence with medications and regular follow-up. The patient was given clear instructions to go to ER or return to medical center if symptoms don't improve, worsen or new problems develop. The patient verbalized understanding.   Follow-up: Return in about 3 months (around 06/05/2018).   Gildardo Pounds, FNP-BC [facility] [place], [place]   03/07/2018, 5:51 PM

## 2018-03-05 NOTE — Patient Instructions (Signed)
Diabetes blood sugar goals  Fasting in AM before breakfast which means at least 8 hrs of no eating or drinking) except water or unsweetened coffee or tea): 90-110 2 hrs after meals: < 160,   Hypoglycemia or low blood sugar: < 70 (You should not have hypoglycemia.)  Aim for 30 minutes of exercise most days. Rethink what you drink. Water is great! Aim for 2-3 Carb Choices per meal (30-45 grams) +/- 1 either way  Aim for 0-15 Carbs per snack if hungry  Include protein in moderation with your meals and snacks  Consider reading food labels for Total Carbohydrate and Fat Grams of foods  Consider checking BG at alternate times per day  Continue taking medication as directed Be mindful about how much sugar you are adding to beverages and other foods. Fruit Punch - find one with no sugar  Measure and decrease portions of carbohydrate foods  Make your plate and don't go back for seconds 

## 2018-03-07 ENCOUNTER — Other Ambulatory Visit: Payer: Self-pay | Admitting: Nurse Practitioner

## 2018-03-07 ENCOUNTER — Other Ambulatory Visit: Payer: Self-pay

## 2018-03-07 DIAGNOSIS — E119 Type 2 diabetes mellitus without complications: Secondary | ICD-10-CM

## 2018-03-07 MED ORDER — LOSARTAN POTASSIUM-HCTZ 50-12.5 MG PO TABS: 2.0000 | ORAL_TABLET | Freq: Every day | ORAL | 3 refills | Status: DC

## 2018-04-09 ENCOUNTER — Other Ambulatory Visit: Payer: Self-pay

## 2018-04-10 ENCOUNTER — Ambulatory Visit: Payer: Medicare Other

## 2018-04-10 ENCOUNTER — Ambulatory Visit: Payer: Medicare Other | Attending: Nurse Practitioner | Admitting: Physician Assistant

## 2018-04-10 VITALS — BP 104/65 | HR 101 | Temp 98.4°F | Resp 16 | Ht 64.0 in | Wt >= 6400 oz

## 2018-04-10 DIAGNOSIS — R609 Edema, unspecified: Secondary | ICD-10-CM | POA: Diagnosis not present

## 2018-04-10 DIAGNOSIS — M7989 Other specified soft tissue disorders: Secondary | ICD-10-CM | POA: Diagnosis present

## 2018-04-10 DIAGNOSIS — E1165 Type 2 diabetes mellitus with hyperglycemia: Secondary | ICD-10-CM | POA: Diagnosis not present

## 2018-04-10 DIAGNOSIS — Z86711 Personal history of pulmonary embolism: Secondary | ICD-10-CM | POA: Diagnosis not present

## 2018-04-10 DIAGNOSIS — I5032 Chronic diastolic (congestive) heart failure: Secondary | ICD-10-CM | POA: Diagnosis not present

## 2018-04-10 DIAGNOSIS — Z794 Long term (current) use of insulin: Secondary | ICD-10-CM

## 2018-04-10 DIAGNOSIS — I110 Hypertensive heart disease with heart failure: Secondary | ICD-10-CM | POA: Insufficient documentation

## 2018-04-10 DIAGNOSIS — G4733 Obstructive sleep apnea (adult) (pediatric): Secondary | ICD-10-CM | POA: Diagnosis not present

## 2018-04-10 DIAGNOSIS — E785 Hyperlipidemia, unspecified: Secondary | ICD-10-CM | POA: Diagnosis not present

## 2018-04-10 DIAGNOSIS — Z6841 Body Mass Index (BMI) 40.0 and over, adult: Secondary | ICD-10-CM | POA: Insufficient documentation

## 2018-04-10 DIAGNOSIS — I10 Essential (primary) hypertension: Secondary | ICD-10-CM

## 2018-04-10 DIAGNOSIS — R202 Paresthesia of skin: Secondary | ICD-10-CM | POA: Diagnosis not present

## 2018-04-10 DIAGNOSIS — K219 Gastro-esophageal reflux disease without esophagitis: Secondary | ICD-10-CM | POA: Insufficient documentation

## 2018-04-10 DIAGNOSIS — Z79899 Other long term (current) drug therapy: Secondary | ICD-10-CM | POA: Diagnosis not present

## 2018-04-10 DIAGNOSIS — E119 Type 2 diabetes mellitus without complications: Secondary | ICD-10-CM

## 2018-04-10 DIAGNOSIS — M1990 Unspecified osteoarthritis, unspecified site: Secondary | ICD-10-CM | POA: Diagnosis not present

## 2018-04-10 LAB — POCT GLYCOSYLATED HEMOGLOBIN (HGB A1C): Hemoglobin A1C: 6.1

## 2018-04-10 LAB — GLUCOSE, POCT (MANUAL RESULT ENTRY): POC GLUCOSE: 131 mg/dL — AB (ref 70–99)

## 2018-04-10 MED ORDER — FUROSEMIDE 20 MG PO TABS: 20.0000 mg | ORAL_TABLET | Freq: Two times a day (BID) | ORAL | 0 refills | Status: AC

## 2018-04-10 NOTE — Progress Notes (Signed)
Pt. Stated she is here for both of her leg swelling with pain. Pt. Also is here for shortness of breath and tingling feeling on her hands.

## 2018-04-10 NOTE — Progress Notes (Signed)
Patient here for lab visit only 

## 2018-04-10 NOTE — Patient Instructions (Signed)
Obtain wrist splints and wear night and day for 2 weeks then at night time for 2 weeks   Edema Edema is when you have too much fluid in your body or under your skin. Edema may make your legs, feet, and ankles swell up. Swelling is also common in looser tissues, like around your eyes. This is a common condition. It gets more common as you get older. There are many possible causes of edema. Eating too much salt (sodium) and being on your feet or sitting for a long time can cause edema in your legs, feet, and ankles. Hot weather may make edema worse. Edema is usually painless. Your skin may look swollen or shiny. Follow these instructions at home:  Keep the swollen body part raised (elevated) above the level of your heart when you are sitting or lying down.  Do not sit still or stand for a long time.  Do not wear tight clothes. Do not wear garters on your upper legs.  Exercise your legs. This can help the swelling go down.  Wear elastic bandages or support stockings as told by your doctor.  Eat a low-salt (low-sodium) diet to reduce fluid as told by your doctor.  Depending on the cause of your swelling, you may need to limit how much fluid you drink (fluid restriction).  Take over-the-counter and prescription medicines only as told by your doctor. Contact a doctor if:  Treatment is not working.  You have heart, liver, or kidney disease and have symptoms of edema.  You have sudden and unexplained weight gain. Get help right away if:  You have shortness of breath or chest pain.  You cannot breathe when you lie down.  You have pain, redness, or warmth in the swollen areas.  You have heart, liver, or kidney disease and get edema all of a sudden.  You have a fever and your symptoms get worse all of a sudden. Summary  Edema is when you have too much fluid in your body or under your skin.  Edema may make your legs, feet, and ankles swell up. Swelling is also common in looser  tissues, like around your eyes.  Raise (elevate) the swollen body part above the level of your heart when you are sitting or lying down.  Follow your doctor's instructions about diet and how much fluid you can drink (fluid restriction). This information is not intended to replace advice given to you by your health care provider. Make sure you discuss any questions you have with your health care provider. Document Released: 05/14/2008 Document Revised: 12/14/2016 Document Reviewed: 12/14/2016 Elsevier Interactive Patient Education  2017 [employer] Carpal Tunnel Syndrome Carpal tunnel syndrome is a condition that causes pain in your hand and arm. The carpal tunnel is a narrow area that is on the palm side of your wrist. Repeated wrist motion or certain diseases may cause swelling in the tunnel. This swelling can pinch the main nerve in the wrist (median nerve). Follow these instructions at home: If you have a splint:  Wear it as told by your doctor. Remove it only as told by your doctor.  Loosen the splint if your fingers: ? Become numb and tingle. ? Turn blue and cold.  Keep the splint clean and dry. General instructions  Take over-the-counter and prescription medicines only as told by your doctor.  Rest your wrist from any activity that may be causing your pain. If needed, talk to your employer about changes that can be made in  your work, such as getting a wrist pad to use while typing.  If directed, apply ice to the painful area: ? Put ice in a plastic bag. ? Place a towel between your skin and the bag. ? Leave the ice on for 20 minutes, 2-3 times per day.  Keep all follow-up visits as told by your doctor. This is important.  Do any exercises as told by your doctor, physical therapist, or occupational therapist. Contact a doctor if:  You have new symptoms.  Medicine does not help your pain.  Your symptoms get worse. This information is not intended to replace advice  given to you by your health care provider. Make sure you discuss any questions you have with your health care provider. Document Released: 11/15/2011 Document Revised: 05/03/2016 Document Reviewed: 04/13/2015 Elsevier Interactive Patient Education  [employer].

## 2018-04-10 NOTE — Progress Notes (Signed)
Patient ID: Brenda Clark, female   DOB: 08-21-1951, 67 y.o.   MRN: [id number]   Brenda Clark, is a 67 y.o. female  [id number]  [id number]  DOB - 1951/05/20  Subjective:  Chief Complaint and HPI: Brenda Clark is a 67 y.o. female here today with several concerns.  B leg swelling has worsened over the last 2-3 weeks.  She stopped all of her meds about 1 month ago except lasix and omeprazole.  Some DOE.  No CP.    Also c/o B hand paresthesias that are worse at night.  No neck pain/problems.    ROS:   Constitutional:  No f/c, No night sweats, No unexplained weight loss. EENT:  No vision changes, No blurry vision, No hearing changes. No mouth, throat, or ear problems.  Respiratory: No cough, mild SOB Cardiac: No CP, no palpitations GI:  No abd pain, No N/V/D. GU: No Urinary s/sx Musculoskeletal: B leg swelling Neuro: No headache, no dizziness, no motor weakness.  Skin: No rash Endocrine:  No polydipsia. No polyuria.  Psych: Denies SI/HI  No problems updated.  ALLERGIES: No Known Allergies  PAST MEDICAL HISTORY: Past Medical History:  Diagnosis Date  . Arthritis    DDD and HNP on MRI in 2008  . 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 Chronic pain   . Endometrial polyp 08/2010   biopsied.   Marland Kitchen GERD (gastroesophageal reflux disease)   . Headache, migraine   . Hiatal hernia 03/05/2014  . Hyperlipidemia   . Iron deficiency anemia, unspecified    ice pica  . Migraine, unspecified, without mention of intractable migraine without mention of status migrainosus   . Morbid obesity ([place])   . OSA (obstructive sleep apnea)   . Osteoarthrosis, unspecified whether generalized or localized, lower leg   . Other and unspecified hyperlipidemia   . Pulmonary embolus ([place]) 2012  . Retinal detachment   . Type II or unspecified type diabetes mellitus without mention of complication, not stated as uncontrolled   . Unspecified essential hypertension     MEDICATIONS AT HOME: Prior to Admission  medications   Medication Sig Start Date End Date Taking? Authorizing Provider  acetaminophen (TYLENOL) 500 MG tablet Take 2 tablets (1,000 mg total) by mouth every 8 (eight) hours as needed. 10/23/16  Yes Funches, Josalyn, MD  furosemide (LASIX) 20 MG tablet Take 1 tablet (20 mg total) by mouth 2 (two) times daily. 04/10/18  Yes Argentina Donovan, PA-C  Misc. Devices ([place]) [place] with seat 05/23/17  Yes Boykin Nearing, MD  PROAIR HFA 108 [phone] Base) MCG/ACT inhaler Inhale 2 puffs into the lungs every 6 (six) hours as needed for wheezing or shortness of breath. 07/31/17  Yes Elsie Stain, MD  atorvastatin (LIPITOR) 40 MG tablet Take 1 tablet (40 mg total) by mouth daily. Patient not taking: Reported on 04/10/2018 07/31/17   Elsie Stain, MD  Blood Glucose Monitoring Suppl (ACCU-CHEK AVIVA PLUS) w/Device KIT 1 Device by Does not apply route 2 (two) times daily. Patient not taking: Reported on 07/31/2017 11/20/16   Boykin Nearing, MD  diclofenac sodium (VOLTAREN) 1 % GEL Apply 2 g topically 4 (four) times daily as needed. Patient not taking: Reported on 04/10/2018 09/25/17   Elsie Stain, MD  ferrous sulfate 325 (65 FE) MG tablet Take 1 tablet (325 mg total) by mouth 2 (two) times daily with a meal. Patient not taking: Reported on 04/10/2018 07/31/17   Elsie Stain, MD  fluticasone [employer]encion Islam)  50 MCG/ACT nasal spray Place 1 spray into both nostrils daily. Patient not taking: Reported on 03/05/2018 07/31/17   Elsie Stain, MD  glucose blood (ACCU-CHEK AVIVA PLUS) test strip 1 each by Other route 2 (two) times daily. Patient not taking: Reported on 04/10/2018 07/31/17   Elsie Stain, MD  metFORMIN (GLUCOPHAGE-XR) 500 MG 24 hr tablet Take 1 tablet (500 mg total) by mouth daily with breakfast. Patient not taking: Reported on 04/10/2018 07/31/17   Elsie Stain, MD  Misc. Devices (ADJUST BATH/SHOWER SEAT) MISC 1 each by Does not apply route daily as needed.  bariatric shower chair weight limit 500 lbs Patient not taking: Reported on 04/10/2018 07/09/17   Boykin Nearing, MD  nitroGLYCERIN (NITROSTAT) 0.4 MG SL tablet Place 1 tablet (0.4 mg total) under the tongue every 5 (five) minutes as needed. Reported on 01/19/2016 Patient not taking: Reported on 04/10/2018 09/25/17   Elsie Stain, MD  omeprazole (PRILOSEC) 20 MG capsule Take 2 capsules (40 mg total) by mouth daily. Patient not taking: Reported on 04/10/2018 07/31/17   Elsie Stain, MD  traMADol (ULTRAM) 50 MG tablet Take 1 tablet (50 mg total) by mouth 3 (three) times daily. Patient not taking: Reported on 04/10/2018 06/21/17   Boykin Nearing, MD     Objective:  EXAM:   Vitals:   04/10/18 0920  BP: 104/65  Pulse: (!) 101  Resp: 16  Temp: 98.4 F (36.9 C)  TempSrc: Oral  SpO2: 94%  Weight: (!) 412 lb 9.6 oz (187.2 kg)  Height: '5\' 4"'  (1.626 m)    General appearance : A&OX3. NAD. Non-toxic-appearing, resting comfortable, uses rolling walker; morbidly obese HEENT: Atraumatic and Normocephalic.  PERRLA. EOM intact.   Neck: supple, no JVD. No cervical lymphadenopathy. No thyromegaly Chest/Lungs:  Breathing-non-labored, Good air entry bilaterally, breath sounds normal without rales, rhonchi, or wheezing  CVS: S1 S2 regular, no murmurs, gallops, rubs  B hands with full S&ROM.  Normal grip.  UE DTR=intact B.  +phalen's, neg tinel's.   Extremities: Bilateral Lower Ext shows 2+ edema of LE, both legs are warm to touch with = pulse throughout Neurology:  CN II-XII grossly intact, Non focal.   Psych:  TP linear. J/I WNL. Normal speech. Appropriate eye contact and affect.  Skin:  No Rash  Data Review Lab Results  Component Value Date   HGBA1C 6.1 04/10/2018   HGBA1C 6.6 09/25/2017   HGBA1C 6.5 05/23/2017     Assessment & Plan   1. Controlled type 2 diabetes mellitus with hyperglycemia, with long-term current use of insulin (HCC) Sub-optimal but adequate control not on metformin.   Work on Manufacturing engineer, weight loss - HgB A1c - Glucose (CBG)  2. Edema, unspecified type Likely due to her stopping her Losartan/HCTZ, but BP is ok - furosemide (LASIX) 20 MG tablet; Take 1 tablet (20 mg total) by mouth 2 (two) times daily.  Dispense: 60 tablet; Refill: 0 - Basic metabolic panel  3. Essential hypertension Controlled and not on meds.    4. Chronic diastolic heart failure (HCC) Increase dose of lasix to bid  5. Paresthesia-likely CTS Rx- B wrist splints 24/7X 2weeks then at night time - Vitamin D, 25-hydroxy - TSH  Patient have been counseled extensively about nutrition and exercise  Return in about 2 weeks (around 04/24/2018) for Geryl Rankins; reassess edema and paresthesias. sooner if worsens  The patient was given clear instructions to go to ER or return to medical center if symptoms don't improve,  worsen or new problems develop. The patient verbalized understanding. The patient was told to call to get lab results if they haven't heard anything in the next week.     Freeman Caldron, PA-C [facility] [place], [place]   04/10/2018, 9:53 AM

## 2018-04-11 ENCOUNTER — Other Ambulatory Visit: Payer: Self-pay | Admitting: Physician Assistant

## 2018-04-11 LAB — BASIC METABOLIC PANEL
BUN / CREAT RATIO: 21 (ref 12–28)
BUN: 15 mg/dL (ref 8–27)
CO2: 21 mmol/L (ref 20–29)
CREATININE: 0.71 mg/dL (ref 0.57–1.00)
Calcium: 8.9 mg/dL (ref 8.7–10.3)
Chloride: 104 mmol/L (ref 96–106)
GFR, EST AFRICAN AMERICAN: 100 mL/min/{1.73_m2} (ref >59)
GFR, EST NON AFRICAN AMERICAN: 87 mL/min/{1.73_m2} (ref >59)
Glucose: 115 mg/dL — ABNORMAL HIGH (ref 65–99)
Potassium: 4.4 mmol/L (ref 3.5–5.2)
Sodium: 141 mmol/L (ref 134–144)

## 2018-04-11 LAB — TSH: TSH: 2.36 u[IU]/mL (ref 0.450–4.500)

## 2018-04-11 LAB — VITAMIN D 25 HYDROXY (VIT D DEFICIENCY, FRACTURES): Vit D, 25-Hydroxy: 9.9 ng/mL — ABNORMAL LOW (ref 30.0–100.0)

## 2018-04-11 MED ORDER — VITAMIN D (ERGOCALCIFEROL) 1.25 MG (50000 UNIT) PO CAPS: 50000.0000 [IU] | ORAL_CAPSULE | ORAL | 0 refills | Status: AC

## 2018-04-14 ENCOUNTER — Telehealth: Payer: Self-pay

## 2018-04-14 NOTE — Telephone Encounter (Signed)
CMA informed patient lab results.  Patient understood.

## 2018-04-14 NOTE — Telephone Encounter (Signed)
-----   Message from Anders Simmonds, [place] sent at 04/11/2018  2:13 PM EDT ----- Please call patient and let them that their vitamin D is very low.  This can contribute to muscle aches, anxiety, fatigue, and depression.  I have sent a prescription to the pharmacy for them to take once a week.  We will recheck this level in 3-4 months.  Your thyroid is normal.  Your other labs are ok except your sugar is a little high.  Work on diabetic diet/eliminating sugars.  Follow-up as planned. Thanks, Georgian Co, PA-C

## 2018-04-23 ENCOUNTER — Ambulatory Visit: Payer: Medicare Other | Attending: Nurse Practitioner | Admitting: Physician Assistant

## 2018-04-23 VITALS — BP 133/82 | HR 90 | Temp 98.4°F | Resp 16 | Ht 64.0 in | Wt >= 6400 oz

## 2018-04-23 DIAGNOSIS — I10 Essential (primary) hypertension: Secondary | ICD-10-CM | POA: Diagnosis not present

## 2018-04-23 DIAGNOSIS — Z79899 Other long term (current) drug therapy: Secondary | ICD-10-CM | POA: Insufficient documentation

## 2018-04-23 DIAGNOSIS — E785 Hyperlipidemia, unspecified: Secondary | ICD-10-CM | POA: Insufficient documentation

## 2018-04-23 DIAGNOSIS — E1165 Type 2 diabetes mellitus with hyperglycemia: Secondary | ICD-10-CM | POA: Insufficient documentation

## 2018-04-23 DIAGNOSIS — K219 Gastro-esophageal reflux disease without esophagitis: Secondary | ICD-10-CM | POA: Insufficient documentation

## 2018-04-23 DIAGNOSIS — Z86711 Personal history of pulmonary embolism: Secondary | ICD-10-CM | POA: Diagnosis not present

## 2018-04-23 DIAGNOSIS — R609 Edema, unspecified: Secondary | ICD-10-CM | POA: Diagnosis not present

## 2018-04-23 DIAGNOSIS — Z6841 Body Mass Index (BMI) 40.0 and over, adult: Secondary | ICD-10-CM | POA: Diagnosis not present

## 2018-04-23 DIAGNOSIS — M1990 Unspecified osteoarthritis, unspecified site: Secondary | ICD-10-CM | POA: Insufficient documentation

## 2018-04-23 DIAGNOSIS — Z794 Long term (current) use of insulin: Secondary | ICD-10-CM

## 2018-04-23 DIAGNOSIS — G4733 Obstructive sleep apnea (adult) (pediatric): Secondary | ICD-10-CM | POA: Insufficient documentation

## 2018-04-23 DIAGNOSIS — G8929 Other chronic pain: Secondary | ICD-10-CM | POA: Insufficient documentation

## 2018-04-23 LAB — GLUCOSE, POCT (MANUAL RESULT ENTRY): POC GLUCOSE: 133 mg/dL — AB (ref 70–99)

## 2018-04-23 NOTE — Progress Notes (Signed)
Pt. Stated her leg swelling are going down. Pt. Stated the hand brace helps her tingling feeling in her hands a little.

## 2018-04-23 NOTE — Progress Notes (Signed)
Patient ID: ANIDA DEOL, female   DOB: [birth year], 67 y.o.   MRN: [id number]   Brenda Clark, is a 67 y.o. female  [id number]  [id number]  DOB - 05-26-1951  Subjective:  Chief Complaint and HPI: Brenda Clark is a 67 y.o. female here today for recheck of edema.  See last night.  She had gone off all meds including metformin, cholesterol meds, and Losartan/HCT and started experiencing leg swelling.  She was not willing at that time to resume her medications and does not want to take them.  I prescribed her Lasix to help with leg swelling and htn.  She is willing to take that.  Here edema has improved by about 50%.  She has been decreasing her sugar intake and does not eat salt.  She denies CP/increasing SOB/palpitations.  She says she feels better overall since stopping her meds.     ROS:   Constitutional:  No f/c, No night sweats, No unexplained weight loss. EENT:  No vision changes, No blurry vision, No hearing changes. No mouth, throat, or ear problems.  Respiratory: No cough, No SOB Cardiac: No CP, no palpitations GI:  No abd pain, No N/V/D. GU: No Urinary s/sx Musculoskeletal: No joint pain Neuro: No headache, no dizziness, no motor weakness.  Skin: No rash Endocrine:  No polydipsia. No polyuria.  Psych: Denies SI/HI  No problems updated.  ALLERGIES: No Known Allergies  PAST MEDICAL HISTORY: Past Medical History:  Diagnosis Date  . Arthritis    DDD and HNP on MRI in 2008  . 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 Chronic pain   . Endometrial polyp 08/2010   biopsied.   Marland Kitchen GERD (gastroesophageal reflux disease)   . Headache, migraine   . Hiatal hernia 03/05/2014  . Hyperlipidemia   . Iron deficiency anemia, unspecified    ice pica  . Migraine, unspecified, without mention of intractable migraine without mention of status migrainosus   . Morbid obesity ([place])   . OSA (obstructive sleep apnea)   . Osteoarthrosis, unspecified whether generalized or localized, lower leg   . Other  and unspecified hyperlipidemia   . Pulmonary embolus ([place]) 2012  . Retinal detachment   . Type II or unspecified type diabetes mellitus without mention of complication, not stated as uncontrolled   . Unspecified essential hypertension     MEDICATIONS AT HOME: Prior to Admission medications   Medication Sig Start Date End Date Taking? Authorizing Provider  acetaminophen (TYLENOL) 500 MG tablet Take 2 tablets (1,000 mg total) by mouth every 8 (eight) hours as needed. 10/23/16  Yes Funches, Adriana Mccallum, MD  ferrous sulfate 325 (65 FE) MG tablet Take 1 tablet (325 mg total) by mouth 2 (two) times daily with a meal. 07/31/17  Yes Elsie Stain, MD  furosemide (LASIX) 20 MG tablet Take 1 tablet (20 mg total) by mouth 2 (two) times daily. 04/10/18  Yes Argentina Donovan, PA-C  Misc. Devices ([place]) MISC Walker with seat 05/23/17  Yes Funches, Josalyn, MD  omeprazole (PRILOSEC) 20 MG capsule Take 2 capsules (40 mg total) by mouth daily. 07/31/17  Yes Elsie Stain, MD  Vitamin D, Ergocalciferol, (DRISDOL) 50000 units CAPS capsule Take 1 capsule (50,000 Units total) by mouth every 7 (seven) days. 04/11/18  Yes Freeman Caldron M, PA-C  atorvastatin (LIPITOR) 40 MG tablet Take 1 tablet (40 mg total) by mouth daily. Patient not taking: Reported on 04/10/2018 07/31/17   Elsie Stain, MD  Blood Glucose Monitoring Suppl (  ACCU-CHEK AVIVA PLUS) w/Device KIT 1 Device by Does not apply route 2 (two) times daily. Patient not taking: Reported on 07/31/2017 11/20/16   Boykin Nearing, MD  diclofenac sodium (VOLTAREN) 1 % GEL Apply 2 g topically 4 (four) times daily as needed. Patient not taking: Reported on 04/10/2018 09/25/17   Elsie Stain, MD  fluticasone [facility]) 50 MCG/ACT nasal spray Place 1 spray into both nostrils daily. Patient not taking: Reported on 03/05/2018 07/31/17   Elsie Stain, MD  glucose blood (ACCU-CHEK AVIVA PLUS) test strip 1 each by Other route 2 (two) times  daily. Patient not taking: Reported on 04/10/2018 07/31/17   Elsie Stain, MD  metFORMIN (GLUCOPHAGE-XR) 500 MG 24 hr tablet Take 1 tablet (500 mg total) by mouth daily with breakfast. Patient not taking: Reported on 04/10/2018 07/31/17   Elsie Stain, MD  Misc. Devices (ADJUST BATH/SHOWER SEAT) MISC 1 each by Does not apply route daily as needed. bariatric shower chair weight limit 500 lbs Patient not taking: Reported on 04/10/2018 07/09/17   Boykin Nearing, MD  nitroGLYCERIN (NITROSTAT) 0.4 MG SL tablet Place 1 tablet (0.4 mg total) under the tongue every 5 (five) minutes as needed. Reported on 01/19/2016 Patient not taking: Reported on 04/10/2018 09/25/17   Elsie Stain, MD  [facility] HFA 108 [phone] Base) MCG/ACT inhaler Inhale 2 puffs into the lungs every 6 (six) hours as needed for wheezing or shortness of breath. Patient not taking: Reported on 04/23/2018 07/31/17   Elsie Stain, MD  traMADol (ULTRAM) 50 MG tablet Take 1 tablet (50 mg total) by mouth 3 (three) times daily. Patient not taking: Reported on 04/10/2018 06/21/17   Boykin Nearing, MD     Objective:  EXAM:   Vitals:   04/23/18 0833  BP: 133/82  Pulse: 90  Resp: 16  Temp: 98.4 F (36.9 C)  TempSrc: Oral  SpO2: 95%  Weight: (!) 409 lb 6.4 oz (185.7 kg)  Height: '5\' 4"'  (1.626 m)   Weight down 3 pounds since last visit.   General appearance : A&OX3. NAD. Non-toxic-appearing; uses a rolling walker  HEENT: Atraumatic and Normocephalic.  PERRLA. EOM intact.   Neck: supple, no JVD. No cervical lymphadenopathy. No thyromegaly Chest/Lungs:  Breathing-non-labored, Good air entry bilaterally, breath sounds normal without rales, rhonchi, or wheezing  CVS: S1 S2 regular, no murmurs, gallops, rubs  Extremities: Bilateral Lower Ext shows ~<2+ edema on R and <1+ edema on L, both legs are warm to touch with = pulse throughout Neurology:  CN II-XII grossly intact, Non focal.   Psych:  TP linear. J/I WNL. Normal speech. Appropriate  eye contact and affect.  Skin:  No Rash  Data Review Lab Results  Component Value Date   HGBA1C 6.1 04/10/2018   HGBA1C 6.6 09/25/2017   HGBA1C 6.5 05/23/2017     Assessment & Plan   1. Edema, unspecified type Improving.  Continue lasix 76m bid - Basic metabolic panel She still refuses to restart her other medications.  At this point her BP and glucose are at suboptimal levels but not terrible.  I will let her make these decisions with her PCP as she is resistant to my recommendations to resuming prescribed treatment  2. Controlled type 2 diabetes mellitus with hyperglycemia, with long-term current use of insulin (HCC) suboptimal but adequate control.   - Glucose (CBG)  3.  htn-  VSS.  I will defer decision making on her prior medication regimen to patient and her PCP as  the patient does not wish to resume her previous regimen.     Patient have been counseled extensively about nutrition and exercise  Return in about 3 weeks (around 05/14/2018) for Geryl Rankins to discuss edema/medication.  The patient was given clear instructions to go to ER or return to medical center if symptoms don't improve, worsen or new problems develop. The patient verbalized understanding. The patient was told to call to get lab results if they haven't heard anything in the next week.     Freeman Caldron, PA-C [facility] [place], [place]   04/23/2018, 8:53 AM

## 2018-04-23 NOTE — Patient Instructions (Signed)
Edema Edema is when you have too much fluid in your body or under your skin. Edema may make your legs, feet, and ankles swell up. Swelling is also common in looser tissues, like around your eyes. This is a common condition. It gets more common as you get older. There are many possible causes of edema. Eating too much salt (sodium) and being on your feet or sitting for a long time can cause edema in your legs, feet, and ankles. Hot weather may make edema worse. Edema is usually painless. Your skin may look swollen or shiny. Follow these instructions at home:  Keep the swollen body part raised (elevated) above the level of your heart when you are sitting or lying down.  Do not sit still or stand for a long time.  Do not wear tight clothes. Do not wear garters on your upper legs.  Exercise your legs. This can help the swelling go down.  Wear elastic bandages or support stockings as told by your doctor.  Eat a low-salt (low-sodium) diet to reduce fluid as told by your doctor.  Depending on the cause of your swelling, you may need to limit how much fluid you drink (fluid restriction).  Take over-the-counter and prescription medicines only as told by your doctor. Contact a doctor if:  Treatment is not working.  You have heart, liver, or kidney disease and have symptoms of edema.  You have sudden and unexplained weight gain. Get help right away if:  You have shortness of breath or chest pain.  You cannot breathe when you lie down.  You have pain, redness, or warmth in the swollen areas.  You have heart, liver, or kidney disease and get edema all of a sudden.  You have a fever and your symptoms get worse all of a sudden. Summary  Edema is when you have too much fluid in your body or under your skin.  Edema may make your legs, feet, and ankles swell up. Swelling is also common in looser tissues, like around your eyes.  Raise (elevate) the swollen body part above the level of your  heart when you are sitting or lying down.  Follow your doctor's instructions about diet and how much fluid you can drink (fluid restriction). This information is not intended to replace advice given to you by your health care provider. Make sure you discuss any questions you have with your health care provider. Document Released: 05/14/2008 Document Revised: 12/14/2016 Document Reviewed: 12/14/2016 Elsevier Interactive Patient Education  2017 [employer]  

## 2018-04-24 ENCOUNTER — Ambulatory Visit
Admission: RE | Admit: 2018-04-24 | Discharge: 2018-04-24 | Disposition: A | Discharge status: Home / Self Care | Payer: Medicare Other | Source: Ambulatory Visit | Attending: Nurse Practitioner | Admitting: Nurse Practitioner

## 2018-04-24 DIAGNOSIS — E2839 Other primary ovarian failure: Secondary | ICD-10-CM

## 2018-04-24 DIAGNOSIS — Z1231 Encounter for screening mammogram for malignant neoplasm of breast: Secondary | ICD-10-CM

## 2018-05-06 ENCOUNTER — Telehealth: Payer: Self-pay

## 2018-05-06 NOTE — Telephone Encounter (Signed)
CMA called patient to inform on results.  Patient understood. Patient verified DOB.

## 2018-05-06 NOTE — Telephone Encounter (Signed)
-----   Message from Claiborne Rigg, NP sent at 05/05/2018  4:40 PM EDT ----- Mammogram results are normal

## 2018-05-08 ENCOUNTER — Encounter (HOSPITAL_COMMUNITY): Payer: Self-pay

## 2018-05-08 ENCOUNTER — Ambulatory Visit (INDEPENDENT_AMBULATORY_CARE_PROVIDER_SITE_OTHER): Payer: 59 | Admitting: Podiatry

## 2018-05-08 ENCOUNTER — Emergency Department (HOSPITAL_COMMUNITY): Payer: Medicare Other

## 2018-05-08 ENCOUNTER — Other Ambulatory Visit: Payer: Self-pay

## 2018-05-08 ENCOUNTER — Inpatient Hospital Stay (HOSPITAL_COMMUNITY)
Admission: EM | Admit: 2018-05-08 | Discharge: 2018-06-09 | DRG: 811 | Disposition: E | Payer: Medicare Other | Attending: Internal Medicine | Admitting: Internal Medicine

## 2018-05-08 ENCOUNTER — Encounter: Payer: Self-pay | Admitting: Podiatry

## 2018-05-08 ENCOUNTER — Emergency Department (HOSPITAL_BASED_OUTPATIENT_CLINIC_OR_DEPARTMENT_OTHER): Admit: 2018-05-08 | Discharge: 2018-05-08 | Disposition: A | Discharge status: Home / Self Care | Payer: Medicare Other

## 2018-05-08 DIAGNOSIS — E118 Type 2 diabetes mellitus with unspecified complications: Secondary | ICD-10-CM

## 2018-05-08 DIAGNOSIS — M79675 Pain in left toe(s): Secondary | ICD-10-CM | POA: Diagnosis not present

## 2018-05-08 DIAGNOSIS — D509 Iron deficiency anemia, unspecified: Principal | ICD-10-CM | POA: Diagnosis present

## 2018-05-08 DIAGNOSIS — M79609 Pain in unspecified limb: Secondary | ICD-10-CM

## 2018-05-08 DIAGNOSIS — M7989 Other specified soft tissue disorders: Secondary | ICD-10-CM

## 2018-05-08 DIAGNOSIS — B351 Tinea unguium: Secondary | ICD-10-CM

## 2018-05-08 DIAGNOSIS — D539 Nutritional anemia, unspecified: Secondary | ICD-10-CM | POA: Diagnosis present

## 2018-05-08 DIAGNOSIS — D62 Acute posthemorrhagic anemia: Secondary | ICD-10-CM | POA: Diagnosis present

## 2018-05-08 DIAGNOSIS — I469 Cardiac arrest, cause unspecified: Secondary | ICD-10-CM | POA: Diagnosis not present

## 2018-05-08 DIAGNOSIS — E785 Hyperlipidemia, unspecified: Secondary | ICD-10-CM | POA: Diagnosis present

## 2018-05-08 DIAGNOSIS — I361 Nonrheumatic tricuspid (valve) insufficiency: Secondary | ICD-10-CM | POA: Diagnosis not present

## 2018-05-08 DIAGNOSIS — M79674 Pain in right toe(s): Secondary | ICD-10-CM | POA: Diagnosis not present

## 2018-05-08 DIAGNOSIS — G43909 Migraine, unspecified, not intractable, without status migrainosus: Secondary | ICD-10-CM | POA: Diagnosis present

## 2018-05-08 DIAGNOSIS — E119 Type 2 diabetes mellitus without complications: Secondary | ICD-10-CM

## 2018-05-08 DIAGNOSIS — I10 Essential (primary) hypertension: Secondary | ICD-10-CM | POA: Diagnosis present

## 2018-05-08 DIAGNOSIS — R0602 Shortness of breath: Secondary | ICD-10-CM | POA: Diagnosis present

## 2018-05-08 DIAGNOSIS — G4730 Sleep apnea, unspecified: Secondary | ICD-10-CM | POA: Diagnosis present

## 2018-05-08 DIAGNOSIS — J449 Chronic obstructive pulmonary disease, unspecified: Secondary | ICD-10-CM | POA: Diagnosis present

## 2018-05-08 DIAGNOSIS — E662 Morbid (severe) obesity with alveolar hypoventilation: Secondary | ICD-10-CM | POA: Diagnosis present

## 2018-05-08 DIAGNOSIS — G4700 Insomnia, unspecified: Secondary | ICD-10-CM | POA: Diagnosis present

## 2018-05-08 DIAGNOSIS — R609 Edema, unspecified: Secondary | ICD-10-CM

## 2018-05-08 DIAGNOSIS — K219 Gastro-esophageal reflux disease without esophagitis: Secondary | ICD-10-CM | POA: Diagnosis present

## 2018-05-08 DIAGNOSIS — Z86711 Personal history of pulmonary embolism: Secondary | ICD-10-CM | POA: Diagnosis not present

## 2018-05-08 DIAGNOSIS — I5033 Acute on chronic diastolic (congestive) heart failure: Secondary | ICD-10-CM | POA: Diagnosis present

## 2018-05-08 DIAGNOSIS — Z6841 Body Mass Index (BMI) 40.0 and over, adult: Secondary | ICD-10-CM

## 2018-05-08 DIAGNOSIS — K449 Diaphragmatic hernia without obstruction or gangrene: Secondary | ICD-10-CM

## 2018-05-08 DIAGNOSIS — Z79899 Other long term (current) drug therapy: Secondary | ICD-10-CM

## 2018-05-08 DIAGNOSIS — E8770 Fluid overload, unspecified: Secondary | ICD-10-CM | POA: Diagnosis present

## 2018-05-08 DIAGNOSIS — R600 Localized edema: Secondary | ICD-10-CM

## 2018-05-08 DIAGNOSIS — I110 Hypertensive heart disease with heart failure: Secondary | ICD-10-CM | POA: Diagnosis present

## 2018-05-08 DIAGNOSIS — I5032 Chronic diastolic (congestive) heart failure: Secondary | ICD-10-CM | POA: Diagnosis not present

## 2018-05-08 DIAGNOSIS — D649 Anemia, unspecified: Secondary | ICD-10-CM | POA: Diagnosis not present

## 2018-05-08 DIAGNOSIS — M1990 Unspecified osteoarthritis, unspecified site: Secondary | ICD-10-CM | POA: Diagnosis present

## 2018-05-08 HISTORY — DX: Anemia, unspecified: D64.9

## 2018-05-08 HISTORY — DX: Personal history of other medical treatment: Z92.89

## 2018-05-08 HISTORY — DX: Low back pain, unspecified: M54.50

## 2018-05-08 HISTORY — DX: Heart failure, unspecified: I50.9

## 2018-05-08 HISTORY — DX: Acute embolism and thrombosis of unspecified deep veins of unspecified lower extremity: I82.409

## 2018-05-08 HISTORY — DX: Headache, unspecified: R51.9

## 2018-05-08 HISTORY — DX: Chronic obstructive pulmonary disease, unspecified: J44.9

## 2018-05-08 HISTORY — DX: Type 2 diabetes mellitus without complications: E11.9

## 2018-05-08 HISTORY — DX: Low back pain: M54.5

## 2018-05-08 HISTORY — DX: Headache: R51

## 2018-05-08 HISTORY — DX: Other chronic pain: G89.29

## 2018-05-08 LAB — TSH: TSH: 1.809 u[IU]/mL (ref 0.350–4.500)

## 2018-05-08 LAB — BASIC METABOLIC PANEL
Anion gap: 8 (ref 5–15)
BUN: 8 mg/dL (ref 6–20)
CHLORIDE: 106 mmol/L (ref 101–111)
CO2: 25 mmol/L (ref 22–32)
CREATININE: 0.78 mg/dL (ref 0.44–1.00)
Calcium: 9 mg/dL (ref 8.9–10.3)
GFR calc Af Amer: >60 mL/min (ref >60)
GFR calc non Af Amer: >60 mL/min (ref >60)
GLUCOSE: 128 mg/dL — AB (ref 65–99)
Potassium: 4.1 mmol/L (ref 3.5–5.1)
SODIUM: 139 mmol/L (ref 135–145)

## 2018-05-08 LAB — CBC
HCT: 25.2 % — ABNORMAL LOW (ref 36.0–46.0)
Hemoglobin: 6.5 g/dL — CL (ref 12.0–15.0)
MCH: 18.8 pg — AB (ref 26.0–34.0)
MCHC: 25.8 g/dL — AB (ref 30.0–36.0)
MCV: 73 fL — AB (ref 78.0–100.0)
Platelets: 338 10*3/uL (ref 150–400)
RBC: 3.45 MIL/uL — ABNORMAL LOW (ref 3.87–5.11)
RDW: 20 % — AB (ref 11.5–15.5)
WBC: 7.2 10*3/uL (ref 4.0–10.5)

## 2018-05-08 LAB — RETICULOCYTES
RBC.: 3.39 MIL/uL — ABNORMAL LOW (ref 3.87–5.11)
RETIC COUNT ABSOLUTE: 81.4 10*3/uL (ref 19.0–186.0)
Retic Ct Pct: 2.4 % (ref 0.4–3.1)

## 2018-05-08 LAB — FERRITIN: FERRITIN: 6 ng/mL — AB (ref 11–307)

## 2018-05-08 LAB — CBG MONITORING, ED
GLUCOSE-CAPILLARY: 105 mg/dL — AB (ref 65–99)
Glucose-Capillary: 197 mg/dL — ABNORMAL HIGH (ref 65–99)

## 2018-05-08 LAB — I-STAT TROPONIN, ED: Troponin i, poc: 0 ng/mL (ref 0.00–0.08)

## 2018-05-08 LAB — POC OCCULT BLOOD, ED: Fecal Occult Bld: NEGATIVE

## 2018-05-08 LAB — BRAIN NATRIURETIC PEPTIDE: B Natriuretic Peptide: 31.2 pg/mL (ref 0.0–100.0)

## 2018-05-08 LAB — IRON AND TIBC
IRON: 10 ug/dL — AB (ref 28–170)
SATURATION RATIOS: 2 % — AB (ref 10.4–31.8)
TIBC: 420 ug/dL (ref 250–450)
UIBC: 410 ug/dL

## 2018-05-08 LAB — T4, FREE: Free T4: 0.86 ng/dL (ref 0.82–1.77)

## 2018-05-08 LAB — PREPARE RBC (CROSSMATCH)

## 2018-05-08 LAB — PREALBUMIN: Prealbumin: 17.5 mg/dL — ABNORMAL LOW (ref 18–38)

## 2018-05-08 LAB — VITAMIN B12: Vitamin B-12: 153 pg/mL — ABNORMAL LOW (ref 180–914)

## 2018-05-08 LAB — FOLATE: FOLATE: 10.3 ng/mL (ref >5.9)

## 2018-05-08 MED ORDER — ONDANSETRON HCL 4 MG/2ML IJ SOLN: 4.0000 mg | Freq: Four times a day (QID) | INTRAMUSCULAR | Status: DC | PRN

## 2018-05-08 MED ORDER — FUROSEMIDE 10 MG/ML IJ SOLN
40.0000 mg | Freq: Two times a day (BID) | INTRAMUSCULAR | Status: AC
  Administered 2018-05-09: 40 mg via INTRAVENOUS
  Filled 2018-05-08 (×2): qty 4

## 2018-05-08 MED ORDER — DICLOFENAC SODIUM 1 % TD GEL
2.0000 g | Freq: Four times a day (QID) | TRANSDERMAL | Status: DC
  Administered 2018-05-08 – 2018-05-11 (×9): 2 g via TOPICAL
  Filled 2018-05-08 (×2): qty 100

## 2018-05-08 MED ORDER — ENOXAPARIN SODIUM 40 MG/0.4ML ~~LOC~~ SOLN: 40.0000 mg | SUBCUTANEOUS | Status: DC

## 2018-05-08 MED ORDER — BISACODYL 10 MG RE SUPP: 10.0000 mg | Freq: Every day | RECTAL | Status: DC | PRN

## 2018-05-08 MED ORDER — FAMOTIDINE IN NACL 20-0.9 MG/50ML-% IV SOLN
20.0000 mg | Freq: Two times a day (BID) | INTRAVENOUS | Status: DC
  Administered 2018-05-09 – 2018-05-11 (×5): 20 mg via INTRAVENOUS
  Filled 2018-05-08 (×5): qty 50

## 2018-05-08 MED ORDER — INSULIN ASPART 100 UNIT/ML ~~LOC~~ SOLN
0.0000 [IU] | Freq: Three times a day (TID) | SUBCUTANEOUS | Status: DC
  Administered 2018-05-09: 4 [IU] via SUBCUTANEOUS
  Administered 2018-05-09: 3 [IU] via SUBCUTANEOUS
  Administered 2018-05-10 – 2018-05-11 (×2): 4 [IU] via SUBCUTANEOUS
  Filled 2018-05-08: qty 1

## 2018-05-08 MED ORDER — POTASSIUM CHLORIDE CRYS ER 20 MEQ PO TBCR
40.0000 meq | EXTENDED_RELEASE_TABLET | Freq: Once | ORAL | Status: AC
  Administered 2018-05-08: 40 meq via ORAL
  Filled 2018-05-08: qty 2

## 2018-05-08 MED ORDER — SODIUM CHLORIDE 0.9 % IV SOLN
Freq: Once | INTRAVENOUS | Status: AC
  Administered 2018-05-08: 20:00:00 via INTRAVENOUS

## 2018-05-08 MED ORDER — FUROSEMIDE 20 MG PO TABS: 20.0000 mg | ORAL_TABLET | Freq: Two times a day (BID) | ORAL | Status: DC

## 2018-05-08 MED ORDER — ALBUTEROL SULFATE (2.5 MG/3ML) 0.083% IN NEBU: 2.5000 mg | INHALATION_SOLUTION | RESPIRATORY_TRACT | Status: DC | PRN

## 2018-05-08 MED ORDER — ONDANSETRON HCL 4 MG PO TABS: 4.0000 mg | ORAL_TABLET | Freq: Four times a day (QID) | ORAL | Status: DC | PRN

## 2018-05-08 MED ORDER — FERROUS SULFATE 325 (65 FE) MG PO TABS
325.0000 mg | ORAL_TABLET | Freq: Two times a day (BID) | ORAL | Status: DC
  Administered 2018-05-09 – 2018-05-11 (×6): 325 mg via ORAL
  Filled 2018-05-08 (×8): qty 1

## 2018-05-08 MED ORDER — ACETAMINOPHEN 650 MG RE SUPP: 650.0000 mg | Freq: Four times a day (QID) | RECTAL | Status: DC | PRN

## 2018-05-08 MED ORDER — HYDROCODONE-ACETAMINOPHEN 5-325 MG PO TABS
1.0000 | ORAL_TABLET | ORAL | Status: DC | PRN
  Administered 2018-05-09: 2 via ORAL
  Administered 2018-05-09 – 2018-05-10 (×2): 1 via ORAL
  Administered 2018-05-10: 2 via ORAL
  Filled 2018-05-08 (×2): qty 1
  Filled 2018-05-08 (×2): qty 2
  Filled 2018-05-08: qty 1

## 2018-05-08 MED ORDER — SENNOSIDES-DOCUSATE SODIUM 8.6-50 MG PO TABS: 1.0000 | ORAL_TABLET | Freq: Every evening | ORAL | Status: DC | PRN

## 2018-05-08 MED ORDER — ACETAMINOPHEN 325 MG PO TABS: 650.0000 mg | ORAL_TABLET | Freq: Four times a day (QID) | ORAL | Status: DC | PRN

## 2018-05-08 NOTE — H&P (Signed)
History and Physical    Brenda Clark QPR:[id number] DOB: [birth year] DOA: 04/12/2018   PCP: Gildardo Pounds, NP   Patient coming from:  Home    Chief Complaint: leg swelling and shortness of breath   HPI: Brenda Clark is a 67 y.o. female with medical history significant for morbid obesity, COPD, OSA, arthritis of the knees, history of PE in 2012, diabetes type 2, history of migraines, history of iron deficiency anemia on chronic iron, with pica, presenting to the emergency department for evaluation of increasing shortness of breath over the last 2 weeks she reports that at her podiatrist office, she was noted to have increasing lower extremity swelling, and dyspnea as mentioned above.  She has been increasingly fatigued.  She denies any obvious bleeding, rectal bleeding.  The patient has on and off dark stools, due to iron.  She denies any chest pain, or palpitations.  She denies any dizziness, syncope or presyncope.  She denies any cough orsick contacts.  She denies any fever or chills.  The patient is not on blood thinners.      ED Course:  BP (!) 149/58   Pulse 91   Temp 98.6 F (37 C) (Oral)   Resp 20   Ht '5\' 3"'  (1.6 m)   Wt (!) 181.9 kg (401 lb)   SpO2 100%   BMI 71.03 kg/m   Hemoglobin was 6.5, and in 2017 during the prior admission was 7.7, baseline is 9.2-9.6. MCV 73, baseline is around 78 White count 7.2, platelets 338. Glucose 128. EKG sinus tachycardia, but otherwise no significant change since July 2018.  QTC normal Chest x-ray NAD. Last 2D echo in 2017 showed EF 60 to 65%, mild hypokinesis, mild LVH.   Review of Systems:  As per HPI otherwise all other systems reviewed and are negative  Past Medical History:  Diagnosis Date  . Arthritis    DDD and HNP on MRI in 2008  . 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 Chronic pain   . COPD (chronic obstructive pulmonary disease) ([place])   . Endometrial polyp 08/2010   biopsied.   Marland Kitchen GERD (gastroesophageal reflux disease)   .  Headache, migraine   . Hiatal hernia 03/05/2014  . Hyperlipidemia   . Iron deficiency anemia, unspecified    ice pica  . Migraine, unspecified, without mention of intractable migraine without mention of status migrainosus   . Morbid obesity ([place])   . OSA (obstructive sleep apnea)   . Osteoarthrosis, unspecified whether generalized or localized, lower leg   . Other and unspecified hyperlipidemia   . Pulmonary embolus ([place]) 2012  . Retinal detachment   . Type II or unspecified type diabetes mellitus without mention of complication, not stated as uncontrolled   . Unspecified essential hypertension     Past Surgical History:  Procedure Laterality Date  . CATARACT EXTRACTION    . COLONOSCOPY WITH PROPOFOL N/A 03/05/2014   Procedure: COLONOSCOPY WITH PROPOFOL;  Surgeon: Gatha Mayer, MD;  Location: [facility];  Service: Endoscopy;  Laterality: N/A;  . ESOPHAGOGASTRODUODENOSCOPY (EGD) WITH PROPOFOL N/A 03/05/2014   Procedure: ESOPHAGOGASTRODUODENOSCOPY (EGD) WITH PROPOFOL;  Surgeon: Gatha Mayer, MD;  Location: [facility];  Service: Endoscopy;  Laterality: N/A;  . LUMBAR [place]    . TUBAL LIGATION      Social History Social History   Socioeconomic History  . Marital status: Widowed    Spouse name: Not on file  . Number of children: 2  . Years of  education: HS  . Highest education level: Not on file  Occupational History  . Occupation: DISABLED    Employer: DISABLED    Comment: since 2007  Social Needs  . Financial resource strain: Not on file  . Food insecurity:    Worry: Not on file    Inability: Not on file  . Transportation needs:    Medical: Not on file    Non-medical: Not on file  Tobacco Use  . Smoking status: Never Smoker  . Smokeless tobacco: Never Used  Substance and Sexual Activity  . Alcohol use: No  . Drug use: No  . Sexual activity: Not Currently  Lifestyle  . Physical activity:    Days per week: Not on file    Minutes per session: Not on  file  . Stress: Not on file  Relationships  . Social connections:    Talks on phone: Not on file    Gets together: Not on file    Attends religious service: Not on file    Active member of club or organization: Not on file    Attends meetings of clubs or organizations: Not on file    Relationship status: Not on file  . Intimate partner violence:    Fear of current or ex partner: Not on file    Emotionally abused: Not on file    Physically abused: Not on file    Forced sexual activity: Not on file  Other Topics Concern  . Not on file  Social History Narrative   Patient with obesity, hyperventilation and obstructive apneas, successfully treated with CPAP at 10 cm water. Good residual AHI values for the last year and low Epworth score. Compliance is good. [facility] follows her.    Her GERD is better on omeprazole and this has further reduced her nocturnal awakenings. She is still hypertensive and needs urgently to lose weight, this would positivley affect her diabetes as well.       I convinced her to take her Topamax and take her Lasix in the AM at 20 mg. She was advised of low carb diet and potassium rich foods.       Patient lives at home alone.   Caffeine Use: occasionally     No Known Allergies  Family History  Problem Relation Age of Onset  . Colon cancer Father   . Breast cancer Sister   . Diabetes Sister   . Heart failure Sister       Prior to Admission medications   Medication Sig Start Date End Date Taking? Authorizing Provider  acetaminophen (TYLENOL 8 HOUR) 650 MG CR tablet Take 1,300 mg by mouth 2 (two) times daily as needed for pain.   Yes [provider]  diclofenac sodium (VOLTAREN) 1 % GEL Apply 2 g topically 4 (four) times daily as needed. 09/25/17  Yes Elsie Stain, MD  ferrous sulfate 325 (65 FE) MG tablet Take 1 tablet (325 mg total) by mouth 2 (two) times daily with a meal. 07/31/17  Yes Elsie Stain, MD  furosemide (LASIX) 20 MG  tablet Take 1 tablet (20 mg total) by mouth 2 (two) times daily. 04/10/18  Yes McClung, Dionne Bucy, PA-C  nitroGLYCERIN (NITROSTAT) 0.4 MG SL tablet Place 1 tablet (0.4 mg total) under the tongue every 5 (five) minutes as needed. Reported on 01/19/2016 09/25/17  Yes Elsie Stain, MD  omeprazole (PRILOSEC) 20 MG capsule Take 2 capsules (40 mg total) by mouth daily. 07/31/17  Yes Joya Gaskins,  Burnett Harry, MD  PROAIR HFA 108 [phone] Base) MCG/ACT inhaler Inhale 2 puffs into the lungs every 6 (six) hours as needed for wheezing or shortness of breath. 07/31/17  Yes Elsie Stain, MD  Vitamin D, Ergocalciferol, (DRISDOL) 50000 units CAPS capsule Take 1 capsule (50,000 Units total) by mouth every 7 (seven) days. 04/11/18  Yes Argentina Donovan, PA-C  acetaminophen (TYLENOL) 500 MG tablet Take 2 tablets (1,000 mg total) by mouth every 8 (eight) hours as needed. Patient not taking: Reported on 04/30/2018 10/23/16   Boykin Nearing, MD  atorvastatin (LIPITOR) 40 MG tablet Take 1 tablet (40 mg total) by mouth daily. Patient not taking: Reported on 04/10/2018 07/31/17   Elsie Stain, MD  Blood Glucose Monitoring Suppl (ACCU-CHEK AVIVA PLUS) w/Device KIT 1 Device by Does not apply route 2 (two) times daily. Patient not taking: Reported on 07/31/2017 11/20/16   Boykin Nearing, MD  fluticasone (FLONASE) 50 MCG/ACT nasal spray Place 1 spray into both nostrils daily. Patient not taking: Reported on 03/05/2018 07/31/17   Elsie Stain, MD  glucose blood (ACCU-CHEK AVIVA PLUS) test strip 1 each by Other route 2 (two) times daily. Patient not taking: Reported on 04/10/2018 07/31/17   Elsie Stain, MD  metFORMIN (GLUCOPHAGE-XR) 500 MG 24 hr tablet Take 1 tablet (500 mg total) by mouth daily with breakfast. Patient not taking: Reported on 04/10/2018 07/31/17   Elsie Stain, MD  Misc. Devices (ADJUST BATH/SHOWER SEAT) MISC 1 each by Does not apply route daily as needed. bariatric shower chair weight limit 500 lbs Patient  not taking: Reported on 04/10/2018 07/09/17   Boykin Nearing, MD  Misc. Devices ([place]) MISC Walker with seat 05/23/17   Boykin Nearing, MD  traMADol (ULTRAM) 50 MG tablet Take 1 tablet (50 mg total) by mouth 3 (three) times daily. Patient not taking: Reported on 04/10/2018 06/21/17   Boykin Nearing, MD    Physical Exam:  Vitals:   05/07/2018 1359 04/15/2018 1530 05/02/2018 1531 04/14/2018 1615  BP: (!) 143/64 (!) 167/74  (!) 149/58  Pulse: 89  88 91  Resp: 20     Temp:      TempSrc:      SpO2: 100%  100% 100%  Weight:      Height:       Constitutional: NAD, calm, comfortable, chronically ill appearing  Eyes: PERRL, lids and conjunctivae normal ENMT: Mucous membranes are moist, without exudate or lesions  Neck: normal, supple, no masses, no thyromegaly Respiratory: , may be masked by body habitus but essentially clear to auscultation bilaterally, no wheezing, no crackles. Normal respiratory effort  Cardiovascular: Regular rate and rhythm,  murmur, rubs or gallops. Bilateral 1-2+ lower  extremity edema. 2+ pedal pulses. No carotid bruits.  Abdomen: Soft, morbidly obese non tender, No hepatosplenomegaly. Bowel sounds positive.  Musculoskeletal: no clubbing / cyanosis. Moves all extremities Skin: no jaundice, No lesions.  Neurologic: Sensation intact  Strength equal in all extremities Psychiatric:   Alert and oriented x 3. Normal mood.     Labs on Admission: I have personally reviewed following labs and imaging studies  CBC: Recent Labs  Lab 04/28/2018 1152  WBC 7.2  HGB 6.5*  HCT 25.2*  MCV 73.0*  PLT 025    Basic Metabolic Panel: Recent Labs  Lab 05/02/2018 1152  NA 139  K 4.1  CL 106  CO2 25  GLUCOSE 128*  BUN 8  CREATININE 0.78  CALCIUM 9.0    GFR:  Estimated Creatinine Clearance: 109.2 mL/min (by C-G formula based on SCr of 0.78 mg/dL).  Liver Function Tests: No results for input(s): AST, ALT, ALKPHOS, BILITOT, PROT, ALBUMIN in the last 168  hours. No results for input(s): LIPASE, AMYLASE in the last 168 hours. No results for input(s): AMMONIA in the last 168 hours.  Coagulation Profile: No results for input(s): INR, PROTIME in the last 168 hours.  Cardiac Enzymes: No results for input(s): CKTOTAL, CKMB, CKMBINDEX, TROPONINI in the last 168 hours.  BNP (last 3 results) No results for input(s): PROBNP in the last 8760 hours.  HbA1C: No results for input(s): HGBA1C in the last 72 hours.  CBG: No results for input(s): GLUCAP in the last 168 hours.  Lipid Profile: No results for input(s): CHOL, HDL, LDLCALC, TRIG, CHOLHDL, LDLDIRECT in the last 72 hours.  Thyroid Function Tests: No results for input(s): TSH, T4TOTAL, FREET4, T3FREE, THYROIDAB in the last 72 hours.  Anemia Panel: No results for input(s): VITAMINB12, FOLATE, FERRITIN, TIBC, IRON, RETICCTPCT in the last 72 hours.  Urine analysis:    Component Value Date/Time   COLORURINE YELLOW 06/24/2017 1125   APPEARANCEUR HAZY (A) 06/24/2017 1125   LABSPEC 1.015 06/24/2017 1125   PHURINE 6.0 06/24/2017 1125   GLUCOSEU NEGATIVE 06/24/2017 1125   HGBUR NEGATIVE 06/24/2017 1125   HGBUR large 08/24/2010 0832   BILIRUBINUR NEGATIVE 06/24/2017 1125   KETONESUR NEGATIVE 06/24/2017 1125   PROTEINUR NEGATIVE 06/24/2017 1125   UROBILINOGEN 0.2 03/28/2015 1151   NITRITE NEGATIVE 06/24/2017 1125   LEUKOCYTESUR MODERATE (A) 06/24/2017 1125    Sepsis Labs: '@LABRCNTIP' (procalcitonin:4,lacticidven:4) )No results found for this or any previous visit (from the past 240 hour(s)).   Radiological Exams on Admission: Dg Chest 2 View  Result Date: 04/20/2018 CLINICAL DATA:  Shortness of breath. Swelling of the right leg. Duration of symptoms 3 weeks. EXAM: CHEST - 2 VIEW COMPARISON:  06/24/2017 FINDINGS: Cardiomegaly. Aortic atherosclerosis. Moderate size hiatal hernia. The pulmonary vascularity is normal. Lungs are clear. Chronic degenerative changes affect the spine. Bridging  osteophytes suggesting DISH. IMPRESSION: No acute radiographic finding. Cardiomegaly. Aortic atherosclerosis. Hiatal hernia. Electronically Signed   By: Nelson Chimes M.D.   On: 04/20/2018 13:57    EKG: Independently reviewed.  Assessment/Plan Principal Problem:   Symptomatic anemia Active Problems:   Diabetes type 2, controlled (HCC)   Hyperlipidemia   MORBID OBESITY   Migraine headache   Osteoarthritis   INSOMNIA   Sleep apnea with use of continuous positive airway pressure (CPAP)   Obesity hypoventilation syndrome (HCC)   Hiatal hernia   HTN (hypertension)   Anemia, iron deficiency   Chronic diastolic heart failure (HCC)   Symptomatic anemia with dyspnea and overall fatigue, history of PICA .Marland Kitchen   She also has history of GI bleed and PE, not on anticoagulation at this time.  No bleeding issues are present   The patient is also cold intolerance   O2 sats are normal.  Chest x-ray NAD.  Hemoccult negative Hemoglobin was 6.5, and in 2017 during the prior admission was 7.7, baseline is 9.2-9.6. MCV 73, baseline is around 78 Inpatient telemetry Continue to transfuse 2 units of blood, with IV Lasix in between Await for iron studies results, may need IV iron infusion while in the hospital Check TSH.  Check T4 Incentive spirometry Oxygen to maintain proper O2 sats. PPI   Lower extremity edema, negative lower extremity ultrasound patient with a history of DVT and PE this is likely due to symptomatic anemia, and to  rule out CHF component.  In addition, the patient has poor nutritional status, may have a malnutrition component. Alb 3,  Obtain pre-albumin levels Lasix IV PT OT, maintain legs elevated above heart Monitor I's and O's and weigh daily 2 D echo (Last 2D echo in 2017 showed EF 60 to 65%, mild hypokinesis, mild LVH.)     Chronic  diastolic CHF  CXR  NAD BNP pending Last 2D echo in 2017 showed EF 60 to 65%, mild hypokinesis, mild LVH. Weight over 400 lbs    Received IV Lasix  in ED  Continue home Lasix  monitor I/Os and daily weights prn 02    Hypertension BP  149/58   Pulse 91  Continue home anti-hypertensive medications     Type II Diabetes Current blood sugar level is 128 Lab Results  Component Value Date   HGBA1C 6.1 04/10/2018  Hold home oral diabetic medications.  SSI  OSA/obesity hypoventilation syndrome/COPD .  Baseline is around 94 O2 sats.  BMI is 71.03 Continue CPAP nightly Nebs prn When the nutritional consult  GERD, no acute symptoms Continue PPI   DVT prophylaxis:  SCD  Code Status:    Full  Family Communication:  Discussed with patient Disposition Plan: Expect patient to be discharged to home after condition improves Consults called:   Nutrition  Admission status: Tele IP    Sharene Butters, PA-C [facility]   Amion text  [phone]   04/19/2018, 5:07 PM

## 2018-05-08 NOTE — ED Notes (Signed)
Back from vascular, patient is in waiting room

## 2018-05-08 NOTE — ED Triage Notes (Signed)
Pt arrived with family with c/o SOB X1 week reports that it has gotten worse. States she went to her foot doctor today to have her toenails looked at. Pt told doctor that she had a knot in her leg and swelling and recommended that she come here to be evaluated for blood clot in the right leg.

## 2018-05-08 NOTE — Progress Notes (Signed)
Subjective:   Patient ID: Brenda Clark, female   DOB: 67 y.o.   MRN: [id number]   HPI 67 year old female presents the office today for diabetic foot evaluation as well as for thick, elongated toenails that she cannot trim herself.  She also has secondary concerns that she has a knot on her right leg and she gets cramping to her right calf.  She does have a history of blood clots and she is very concerned that she has a blood clot in the right leg.  She also states that over the last couple days she has been short of breath.  She is currently not taking any blood thinners.  She has had no treatment for this.   Review of Systems  All other systems reviewed and are negative.  Past Medical History:  Diagnosis Date  . Arthritis    DDD and HNP on MRI in 2008  . 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 Chronic pain   . Endometrial polyp 08/2010   biopsied.   Marland Kitchen GERD (gastroesophageal reflux disease)   . Headache, migraine   . Hiatal hernia 03/05/2014  . Hyperlipidemia   . Iron deficiency anemia, unspecified    ice pica  . Migraine, unspecified, without mention of intractable migraine without mention of status migrainosus   . Morbid obesity ([place])   . OSA (obstructive sleep apnea)   . Osteoarthrosis, unspecified whether generalized or localized, lower leg   . Other and unspecified hyperlipidemia   . Pulmonary embolus ([place]) 2012  . Retinal detachment   . Type II or unspecified type diabetes mellitus without mention of complication, not stated as uncontrolled   . Unspecified essential hypertension     Past Surgical History:  Procedure Laterality Date  . CATARACT EXTRACTION    . COLONOSCOPY WITH PROPOFOL N/A 03/05/2014   Procedure: COLONOSCOPY WITH PROPOFOL;  Surgeon: Gatha Mayer, MD;  Location: [facility];  Service: Endoscopy;  Laterality: N/A;  . ESOPHAGOGASTRODUODENOSCOPY (EGD) WITH PROPOFOL N/A 03/05/2014   Procedure: ESOPHAGOGASTRODUODENOSCOPY (EGD) WITH PROPOFOL;  Surgeon: Gatha Mayer, MD;   Location: [facility];  Service: Endoscopy;  Laterality: N/A;  . LUMBAR [place]    . TUBAL LIGATION       Current Outpatient Medications:  .  acetaminophen (TYLENOL) 500 MG tablet, Take 2 tablets (1,000 mg total) by mouth every 8 (eight) hours as needed., Disp: 180 tablet, Rfl: 5 .  atorvastatin (LIPITOR) 40 MG tablet, Take 1 tablet (40 mg total) by mouth daily. (Patient not taking: Reported on 04/10/2018), Disp: 30 tablet, Rfl: 11 .  Blood Glucose Monitoring Suppl (ACCU-CHEK AVIVA PLUS) w/Device KIT, 1 Device by Does not apply route 2 (two) times daily. (Patient not taking: Reported on 07/31/2017), Disp: 1 kit, Rfl: 0 .  diclofenac sodium (VOLTAREN) 1 % GEL, Apply 2 g topically 4 (four) times daily as needed. (Patient not taking: Reported on 04/10/2018), Disp: 1 Tube, Rfl: 3 .  ferrous sulfate 325 (65 FE) MG tablet, Take 1 tablet (325 mg total) by mouth 2 (two) times daily with a meal., Disp: 180 tablet, Rfl: 3 .  fluticasone (FLONASE) 50 MCG/ACT nasal spray, Place 1 spray into both nostrils daily. (Patient not taking: Reported on 03/05/2018), Disp: 16 g, Rfl: 6 .  furosemide (LASIX) 20 MG tablet, Take 1 tablet (20 mg total) by mouth 2 (two) times daily., Disp: 60 tablet, Rfl: 0 .  glucose blood (ACCU-CHEK AVIVA PLUS) test strip, 1 each by Other route 2 (two) times daily. (Patient not  taking: Reported on 04/10/2018), Disp: 100 each, Rfl: 12 .  metFORMIN (GLUCOPHAGE-XR) 500 MG 24 hr tablet, Take 1 tablet (500 mg total) by mouth daily with breakfast. (Patient not taking: Reported on 04/10/2018), Disp: 90 tablet, Rfl: 3 .  Misc. Devices (ADJUST BATH/SHOWER SEAT) MISC, 1 each by Does not apply route daily as needed. bariatric shower chair weight limit 500 lbs (Patient not taking: Reported on 04/10/2018), Disp: 1 each, Rfl: 0 .  Misc. Devices ([place]) MISC, Walker with seat, Disp: 1 each, Rfl: 0 .  nitroGLYCERIN (NITROSTAT) 0.4 MG SL tablet, Place 1 tablet (0.4 mg total) under the tongue  every 5 (five) minutes as needed. Reported on 01/19/2016 (Patient not taking: Reported on 04/10/2018), Disp: 30 tablet, Rfl: 6 .  omeprazole (PRILOSEC) 20 MG capsule, Take 2 capsules (40 mg total) by mouth daily., Disp: 60 capsule, Rfl: 11 .  PROAIR HFA 108 (90 Base) MCG/ACT inhaler, Inhale 2 puffs into the lungs every 6 (six) hours as needed for wheezing or shortness of breath. (Patient not taking: Reported on 04/23/2018), Disp: 1 Inhaler, Rfl: 2 .  traMADol (ULTRAM) 50 MG tablet, Take 1 tablet (50 mg total) by mouth 3 (three) times daily. (Patient not taking: Reported on 04/10/2018), Disp: 90 tablet, Rfl: 2 .  Vitamin D, Ergocalciferol, (DRISDOL) 50000 units CAPS capsule, Take 1 capsule (50,000 Units total) by mouth every 7 (seven) days., Disp: 16 capsule, Rfl: 0  No Known Allergies  Social History   Socioeconomic History  . Marital status: Widowed    Spouse name: Not on file  . Number of children: 2  . Years of education: HS  . Highest education level: Not on file  Occupational History  . Occupation: DISABLED    Employer: DISABLED    Comment: since 2007  Social Needs  . Financial resource strain: Not on file  . Food insecurity:    Worry: Not on file    Inability: Not on file  . Transportation needs:    Medical: Not on file    Non-medical: Not on file  Tobacco Use  . Smoking status: Never Smoker  . Smokeless tobacco: Never Used  Substance and Sexual Activity  . Alcohol use: No  . Drug use: No  . Sexual activity: Not Currently  Lifestyle  . Physical activity:    Days per week: Not on file    Minutes per session: Not on file  . Stress: Not on file  Relationships  . Social connections:    Talks on phone: Not on file    Gets together: Not on file    Attends religious service: Not on file    Active member of club or organization: Not on file    Attends meetings of clubs or organizations: Not on file    Relationship status: Not on file  . Intimate partner violence:    Fear of  current or ex partner: Not on file    Emotionally abused: Not on file    Physically abused: Not on file    Forced sexual activity: Not on file  Other Topics Concern  . Not on file  Social History Narrative   Patient with obesity, hyperventilation and obstructive apneas, successfully treated with CPAP at 10 cm water. Good residual AHI values for the last year and low Epworth score. Compliance is good. [facility] follows her.    Her GERD is better on omeprazole and this has further reduced her nocturnal awakenings. She is still hypertensive and  needs urgently to lose weight, this would positivley affect her diabetes as well.       I convinced her to take her Topamax and take her Lasix in the AM at 20 mg. She was advised of low carb diet and potassium rich foods.       Patient lives at home alone.   Caffeine Use: occasionally        Objective:  Physical Exam  General: AAO x3, NAD  Dermatological:Nails are hypertrophic, dystrophic, brittle, discolored, elongated 10. No surrounding redness or drainage. Tenderness nails 1-5 bilaterally. No open lesions or pre-ulcerative lesions are identified today.  Vascular: Dorsalis Pedis artery and Posterior Tibial artery pedal pulses are 2/4 bilateral with immedate capillary fill time. Pedal hair growth present. There is swelling to the right leg and there is pain when I compress the calf.  There is no erythema or increase in warmth.  Neruologic: Grossly intact via light touch bilateral.. Protective threshold with Semmes Wienstein monofilament intact to all pedal sites bilateral.   Musculoskeletal: No gross boney pedal deformities bilateral. No pain, crepitus, or limitation noted with foot and ankle range of motion bilateral. Muscular strength 5/5 in all groups tested bilateral.  Gait: Unassisted, Nonantalgic.       Assessment:   67 year old female presents for diabetic foot evaluation, symptomatic onychomycosis however with concerns for blood  clot    Plan:  -Treatment options discussed including all alternatives, risks, and complications -Etiology of symptoms were discussed -Nails debrided 10 without complications or bleeding. -Daily foot inspection -I am very concerned about the pain to her right calf as well as her shortness of breath.  She also has a history of blood clots and she is not on blood thinners.  I strongly encouraged her to the emergency room today for evaluation and will be of the outpatient ultrasound done.  She declined me calling the ambulance to take her to the hospital.  She came today on the [employer] bus. She called her sister to pick her up to take her to the ER.  -Follow-up in 3 months or sooner if any problems arise. In the meantime, encouraged to call the office with any questions, concerns, change in symptoms.   Celesta Gentile, DPM

## 2018-05-08 NOTE — ED Notes (Signed)
Called to reassess vitals. Pt in Vascular

## 2018-05-08 NOTE — ED Provider Notes (Addendum)
[facility] EMERGENCY DEPARTMENT Provider Note   CSN: [id number] Arrival date & time: 04/22/2018  1130     History   Chief Complaint Chief Complaint  Patient presents with  . Leg Swelling  . Shortness of Breath    HPI Brenda Clark is a 67 y.o. female.  HPI  67 year old female with a history of COPD and prior iron deficiency anemia presents with shortness of breath and leg swelling.  She states for the past 1 week both legs have been swollen but she is felt a knot in her right lower calf.  She is had shortness of breath and fatigue for about 2 or 3 weeks.  She has not noticed any obvious bleeding including no rectal bleeding.  She on and off has dark stools due to iron.  No chest pain at this time.  No significant cough different from her chronic cough from COPD. Not on blood thinners.  Past Medical History:  Diagnosis Date  . Arthritis    DDD and HNP on MRI in 2008  . 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 Chronic pain   . COPD (chronic obstructive pulmonary disease) ([place])   . Endometrial polyp 08/2010   biopsied.   Marland Kitchen GERD (gastroesophageal reflux disease)   . Headache, migraine   . Hiatal hernia 03/05/2014  . Hyperlipidemia   . Iron deficiency anemia, unspecified    ice pica  . Migraine, unspecified, without mention of intractable migraine without mention of status migrainosus   . Morbid obesity ([place])   . OSA (obstructive sleep apnea)   . Osteoarthrosis, unspecified whether generalized or localized, lower leg   . Other and unspecified hyperlipidemia   . Pulmonary embolus ([place]) 2012  . Retinal detachment   . Type II or unspecified type diabetes mellitus without mention of complication, not stated as uncontrolled   . Unspecified essential hypertension     Patient Active Problem List   Diagnosis Date Noted  . Diabetes mellitus with complication ([place])   . Chronic diastolic heart failure ([place])   . Anemia, iron deficiency 07/22/2014  . HTN (hypertension)  03/16/2014  . Hiatal hernia 03/05/2014  . Diverticulosis of colon without hemorrhage 03/05/2014  . Sleep apnea with use of continuous positive airway pressure (CPAP) 09/11/2013  . Obesity hypoventilation syndrome ([place]) 09/11/2013  . CARDIOVASCULAR FUNCTION STUDY, ABNORMAL 02/09/2010  . Edema 11/08/2009  . CAROTID BRUIT, RIGHT 04/06/2009  . Hyperlipidemia 07/05/2008  . INSOMNIA 01/21/2008  . Diabetes type 2, controlled ([place]) 08/04/2007  . MORBID OBESITY 08/04/2007  . Osteoarthritis 08/04/2007  . MIGRAINE, CHRONIC 10/10/2006    Past Surgical History:  Procedure Laterality Date  . CATARACT EXTRACTION    . COLONOSCOPY WITH PROPOFOL N/A 03/05/2014   Procedure: COLONOSCOPY WITH PROPOFOL;  Surgeon: Gatha Mayer, MD;  Location: [facility];  Service: Endoscopy;  Laterality: N/A;  . ESOPHAGOGASTRODUODENOSCOPY (EGD) WITH PROPOFOL N/A 03/05/2014   Procedure: ESOPHAGOGASTRODUODENOSCOPY (EGD) WITH PROPOFOL;  Surgeon: Gatha Mayer, MD;  Location: [facility];  Service: Endoscopy;  Laterality: N/A;  . LUMBAR [place]    . TUBAL LIGATION       OB History    Gravida  3   Para  2   Term  2   Preterm  0   AB  1   Living  2     SAB  1   TAB  0   Ectopic  0   Multiple  0   Live Births  Home Medications    Prior to Admission medications   Medication Sig Start Date End Date Taking? Authorizing Provider  acetaminophen (TYLENOL 8 HOUR) 650 MG CR tablet Take 1,300 mg by mouth 2 (two) times daily as needed for pain.   Yes [provider]  diclofenac sodium (VOLTAREN) 1 % GEL Apply 2 g topically 4 (four) times daily as needed. 09/25/17  Yes Elsie Stain, MD  ferrous sulfate 325 (65 FE) MG tablet Take 1 tablet (325 mg total) by mouth 2 (two) times daily with a meal. 07/31/17  Yes Elsie Stain, MD  furosemide (LASIX) 20 MG tablet Take 1 tablet (20 mg total) by mouth 2 (two) times daily. 04/10/18  Yes McClung, Dionne Bucy, PA-C  nitroGLYCERIN  (NITROSTAT) 0.4 MG SL tablet Place 1 tablet (0.4 mg total) under the tongue every 5 (five) minutes as needed. Reported on 01/19/2016 09/25/17  Yes Elsie Stain, MD  omeprazole (PRILOSEC) 20 MG capsule Take 2 capsules (40 mg total) by mouth daily. 07/31/17  Yes Elsie Stain, MD  PROAIR HFA 108 [phone] Base) MCG/ACT inhaler Inhale 2 puffs into the lungs every 6 (six) hours as needed for wheezing or shortness of breath. 07/31/17  Yes Elsie Stain, MD  Vitamin D, Ergocalciferol, (DRISDOL) 50000 units CAPS capsule Take 1 capsule (50,000 Units total) by mouth every 7 (seven) days. 04/11/18  Yes Argentina Donovan, PA-C  acetaminophen (TYLENOL) 500 MG tablet Take 2 tablets (1,000 mg total) by mouth every 8 (eight) hours as needed. Patient not taking: Reported on 05/09/2018 10/23/16   Boykin Nearing, MD  atorvastatin (LIPITOR) 40 MG tablet Take 1 tablet (40 mg total) by mouth daily. Patient not taking: Reported on 04/10/2018 07/31/17   Elsie Stain, MD  Blood Glucose Monitoring Suppl (ACCU-CHEK AVIVA PLUS) w/Device KIT 1 Device by Does not apply route 2 (two) times daily. Patient not taking: Reported on 07/31/2017 11/20/16   Boykin Nearing, MD  fluticasone (FLONASE) 50 MCG/ACT nasal spray Place 1 spray into both nostrils daily. Patient not taking: Reported on 03/05/2018 07/31/17   Elsie Stain, MD  glucose blood (ACCU-CHEK AVIVA PLUS) test strip 1 each by Other route 2 (two) times daily. Patient not taking: Reported on 04/10/2018 07/31/17   Elsie Stain, MD  metFORMIN (GLUCOPHAGE-XR) 500 MG 24 hr tablet Take 1 tablet (500 mg total) by mouth daily with breakfast. Patient not taking: Reported on 04/10/2018 07/31/17   Elsie Stain, MD  Misc. Devices (ADJUST BATH/SHOWER SEAT) MISC 1 each by Does not apply route daily as needed. bariatric shower chair weight limit 500 lbs Patient not taking: Reported on 04/10/2018 07/09/17   Boykin Nearing, MD  Misc. Devices ([place]) MISC Walker  with seat 05/23/17   Boykin Nearing, MD  traMADol (ULTRAM) 50 MG tablet Take 1 tablet (50 mg total) by mouth 3 (three) times daily. Patient not taking: Reported on 04/10/2018 06/21/17   Boykin Nearing, MD    Family History Family History  Problem Relation Age of Onset  . Colon cancer Father   . Breast cancer Sister   . Diabetes Sister   . Heart failure Sister     Social History Social History   Tobacco Use  . Smoking status: Never Smoker  . Smokeless tobacco: Never Used  Substance Use Topics  . Alcohol use: No  . Drug use: No     Allergies   Patient has no known allergies.   Review of Systems Review  of Systems  Constitutional: Positive for fatigue.  Respiratory: Positive for shortness of breath.   Cardiovascular: Positive for leg swelling. Negative for chest pain.  Gastrointestinal: Negative for abdominal pain and blood in stool.  All other systems reviewed and are negative.    Physical Exam Updated Vital Signs BP (!) 167/74   Pulse 88   Temp 98.6 F (37 C) (Oral)   Resp 20   Ht '5\' 3"'  (1.6 m)   Wt (!) 181.9 kg (401 lb)   SpO2 100%   BMI 71.03 kg/m   Physical Exam  Constitutional: She is oriented to person, place, and time. She appears well-developed and well-nourished.  Non-toxic appearance. She does not appear ill.  Morbidly obese  HENT:  Head: Normocephalic and atraumatic.  Right Ear: External ear normal.  Left Ear: External ear normal.  Nose: Nose normal.  Eyes: Right eye exhibits no discharge. Left eye exhibits no discharge.  Cardiovascular: Normal rate, regular rhythm and normal heart sounds.  Pulmonary/Chest: Breath sounds normal. No accessory muscle usage. Tachypnea noted.  Abdominal: Soft. There is no tenderness.  Musculoskeletal:       Right lower leg: She exhibits edema (pitting).       Left lower leg: She exhibits edema (mild, less than right leg).  Neurological: She is alert and oriented to person, place, and time.  Skin: Skin is warm  and dry.  Nursing note and vitals reviewed.    ED Treatments / Results  Labs (all labs ordered are listed, but only abnormal results are displayed) Labs Reviewed  BASIC METABOLIC PANEL - Abnormal; Notable for the following components:      Result Value   Glucose, Bld 128 (*)    All other components within normal limits  CBC - Abnormal; Notable for the following components:   RBC 3.45 (*)    Hemoglobin 6.5 (*)    HCT 25.2 (*)    MCV 73.0 (*)    MCH 18.8 (*)    MCHC 25.8 (*)    RDW 20.0 (*)    All other components within normal limits  BRAIN NATRIURETIC PEPTIDE  VITAMIN B12  FOLATE  IRON AND TIBC  FERRITIN  RETICULOCYTES  I-STAT TROPONIN, ED  POC OCCULT BLOOD, ED  TYPE AND SCREEN  PREPARE RBC (CROSSMATCH)    EKG EKG Interpretation  Date/Time:  Thursday May 08 2018 11:34:46 EDT Ventricular Rate:  103 PR Interval:  148 QRS Duration: 86 QT Interval:  342 QTC Calculation: 448 R Axis:   33 Text Interpretation:  Sinus tachycardia Possible Inferior infarct , age undetermined Abnormal ECG rate faster, otherwise no significant change since July 2018 Confirmed by Sherwood Gambler [phone]) on 05/04/2018 2:38:20 PM   Radiology Dg Chest 2 View  Result Date: 04/17/2018 CLINICAL DATA:  Shortness of breath. Swelling of the right leg. Duration of symptoms 3 weeks. EXAM: CHEST - 2 VIEW COMPARISON:  06/24/2017 FINDINGS: Cardiomegaly. Aortic atherosclerosis. Moderate size hiatal hernia. The pulmonary vascularity is normal. Lungs are clear. Chronic degenerative changes affect the spine. Bridging osteophytes suggesting DISH. IMPRESSION: No acute radiographic finding. Cardiomegaly. Aortic atherosclerosis. Hiatal hernia. Electronically Signed   By: Nelson Chimes M.D.   On: 04/11/2018 13:57    Procedures .Critical Care Performed by: Sherwood Gambler, MD Authorized by: Sherwood Gambler, MD   Critical care provider statement:    Critical care time (minutes):  30   Critical care time was  exclusive of:  Separately billable procedures and treating other patients   Critical care was  necessary to treat or prevent imminent or life-threatening deterioration of the following conditions:  Circulatory failure, shock and respiratory failure   Critical care was time spent personally by me on the following activities:  Development of treatment plan with patient or surrogate, discussions with consultants, evaluation of patient's response to treatment, examination of patient, obtaining history from patient or surrogate, ordering and review of laboratory studies, ordering and performing treatments and interventions, ordering and review of radiographic studies, pulse oximetry, re-evaluation of patient's condition and review of old charts   (including critical care time)  Medications Ordered in ED Medications  0.9 %  sodium chloride infusion (has no administration in time range)     Initial Impression / Assessment and Plan / ED Course  I have reviewed the triage vital signs and the nursing notes.  Pertinent labs & imaging results that were available during my care of the patient were reviewed by me and considered in my medical decision making (see chart for details).     Patient shortness of breath and fatigue are likely from symptomatic anemia.  Her most recent hemoglobin was around 9 back in July.  Today it is 6.5.  There is no obvious gross blood or melena on rectal exam by me.  However with her symptomatic anemia and significant dyspnea on exertion, she will need admission for further work-up as well as transfusion.  I have ordered her 1 unit and after talking with Sharene Butters, admitting hospitalist, she requested to be 2 units.  Anemia panel has been drawn.  However she is otherwise stable appearing.  Admit.  Final Clinical Impressions(s) / ED Diagnoses   Final diagnoses:  Symptomatic anemia    ED Discharge Orders    None       Sherwood Gambler, MD 04/18/2018 1614    Sherwood Gambler, MD 05/05/2018 [phone]

## 2018-05-08 NOTE — ED Notes (Signed)
Iv was infiltrated when pt arrived to the floor. Had to pause blood

## 2018-05-08 NOTE — Progress Notes (Signed)
VASCULAR LAB PRELIMINARY  PRELIMINARY  PRELIMINARY  PRELIMINARY  Right lower extremity venous duplex completed.    Preliminary report:  There is no DVT or SVT noted in the right lower extremity.   Rylie Limburg, RVT 05/05/2018, 1:40 PM

## 2018-05-09 ENCOUNTER — Inpatient Hospital Stay (HOSPITAL_COMMUNITY): Payer: Medicare Other

## 2018-05-09 ENCOUNTER — Encounter (HOSPITAL_COMMUNITY): Payer: Self-pay | Admitting: General Practice

## 2018-05-09 DIAGNOSIS — D649 Anemia, unspecified: Secondary | ICD-10-CM

## 2018-05-09 DIAGNOSIS — I361 Nonrheumatic tricuspid (valve) insufficiency: Secondary | ICD-10-CM

## 2018-05-09 LAB — BASIC METABOLIC PANEL
ANION GAP: 9 (ref 5–15)
BUN: 7 mg/dL (ref 6–20)
CHLORIDE: 103 mmol/L (ref 101–111)
CO2: 25 mmol/L (ref 22–32)
Calcium: 8.6 mg/dL — ABNORMAL LOW (ref 8.9–10.3)
Creatinine, Ser: 0.61 mg/dL (ref 0.44–1.00)
GFR calc non Af Amer: >60 mL/min (ref >60)
Glucose, Bld: 131 mg/dL — ABNORMAL HIGH (ref 65–99)
POTASSIUM: 4.2 mmol/L (ref 3.5–5.1)
SODIUM: 137 mmol/L (ref 135–145)

## 2018-05-09 LAB — BPAM RBC
BLOOD PRODUCT EXPIRATION DATE: 201906242359
Blood Product Expiration Date: 201906252359
ISSUE DATE / TIME: 201905301819
ISSUE DATE / TIME: 201905302144
UNIT TYPE AND RH: 8400
Unit Type and Rh: 8400

## 2018-05-09 LAB — TYPE AND SCREEN
ABO/RH(D): AB POS
ANTIBODY SCREEN: NEGATIVE
UNIT DIVISION: 0
Unit division: 0

## 2018-05-09 LAB — ECHOCARDIOGRAM COMPLETE
HEIGHTINCHES: 63 in
Weight: 6416 oz

## 2018-05-09 LAB — CBC
HEMATOCRIT: 27 % — AB (ref 36.0–46.0)
HEMATOCRIT: 29.5 % — AB (ref 36.0–46.0)
Hemoglobin: 7.5 g/dL — ABNORMAL LOW (ref 12.0–15.0)
Hemoglobin: 8.1 g/dL — ABNORMAL LOW (ref 12.0–15.0)
MCH: 20.5 pg — ABNORMAL LOW (ref 26.0–34.0)
MCH: 20.3 pg — ABNORMAL LOW (ref 26.0–34.0)
MCHC: 27.8 g/dL — ABNORMAL LOW (ref 30.0–36.0)
MCHC: 27.5 g/dL — ABNORMAL LOW (ref 30.0–36.0)
MCV: 74 fL — AB (ref 78.0–100.0)
MCV: 73.8 fL — AB (ref 78.0–100.0)
Platelets: 296 10*3/uL (ref 150–400)
Platelets: 308 10*3/uL (ref 150–400)
RBC: 3.65 MIL/uL — AB (ref 3.87–5.11)
RBC: 4 MIL/uL (ref 3.87–5.11)
RDW: 20.8 % — ABNORMAL HIGH (ref 11.5–15.5)
RDW: 21 % — ABNORMAL HIGH (ref 11.5–15.5)
WBC: 7.9 10*3/uL (ref 4.0–10.5)
WBC: 9.9 10*3/uL (ref 4.0–10.5)

## 2018-05-09 LAB — GLUCOSE, CAPILLARY: Glucose-Capillary: 166 mg/dL — ABNORMAL HIGH (ref 65–99)

## 2018-05-09 LAB — CBG MONITORING, ED: GLUCOSE-CAPILLARY: 144 mg/dL — AB (ref 65–99)

## 2018-05-09 LAB — HIV ANTIBODY (ROUTINE TESTING W REFLEX): HIV Screen 4th Generation wRfx: NONREACTIVE

## 2018-05-09 MED ORDER — IPRATROPIUM-ALBUTEROL 0.5-2.5 (3) MG/3ML IN SOLN
3.0000 mL | Freq: Four times a day (QID) | RESPIRATORY_TRACT | Status: DC
  Administered 2018-05-09 (×2): 3 mL via RESPIRATORY_TRACT
  Filled 2018-05-09 (×3): qty 3

## 2018-05-09 MED ORDER — PREMIER PROTEIN SHAKE
11.0000 [oz_av] | Freq: Two times a day (BID) | ORAL | Status: DC
  Administered 2018-05-11 (×2): 11 [oz_av] via ORAL
  Filled 2018-05-09 (×8): qty 325.31

## 2018-05-09 MED ORDER — FUROSEMIDE 10 MG/ML IJ SOLN
40.0000 mg | Freq: Once | INTRAMUSCULAR | Status: DC
  Filled 2018-05-09: qty 4

## 2018-05-09 MED ORDER — TRAZODONE HCL 50 MG PO TABS
50.0000 mg | ORAL_TABLET | Freq: Once | ORAL | Status: AC
  Administered 2018-05-09: 50 mg via ORAL
  Filled 2018-05-09: qty 1

## 2018-05-09 MED ORDER — PREDNISONE 20 MG PO TABS
40.0000 mg | ORAL_TABLET | Freq: Every day | ORAL | Status: DC
  Administered 2018-05-09 – 2018-05-11 (×3): 40 mg via ORAL
  Filled 2018-05-09 (×4): qty 2

## 2018-05-09 NOTE — Evaluation (Signed)
Occupational Therapy Evaluation Patient Details Name: Brenda Clark MRN: [id number] DOB: 10-20-1951 Today's Date: 05/09/2018    History of Present Illness This 67 y.o. female admitted with increased Rt LE edema and increasing SOB.  Hbg 6.5 on admission - GI consulted for possible GI bleed, work up underway.  Doppler of Rt LE negaive for DVT/SVT.  PMH includes:  h/o PE, morbid obesity, DM, retinal detachment, OSA, COPD (does not wear 02 at home)   Clinical Impression   Pt admitted with above. She demonstrates the below listed deficits and will benefit from continued OT to maximize safety and independence with BADLs.  Pt presents to OT with generalized weakness, decreased activity tolerance, DOE, pain Rt LE.  She currently only tolerated sitting EOB due SOB and Rt LE pain.  She requires min - total A for ADLs and mod A for bed mobility.  She lives a lone, but has a PCA 7 days/week for 2.5-3 hours/day.  She was able to perform ADLs mod I, but required assist for IADLs.  Based on current status, pt will likely require SNF level rehab at discharge.  Will follow.       Follow Up Recommendations  SNF;Supervision/Assistance - 24 hour    Equipment Recommendations  3 in 1 bedside commode    Recommendations for Other Services       Precautions / Restrictions Precautions Precautions: Fall Restrictions Weight Bearing Restrictions: No      Mobility Bed Mobility Overal bed mobility: Needs Assistance Bed Mobility: Supine to Sit;Sit to Supine     Supine to sit: Min assist Sit to supine: Mod assist   General bed mobility comments: assist for LEs   Transfers Overall transfer level: Needs assistance               General transfer comment: unable to attempt due to Rt LE pain     Balance Overall balance assessment: Needs assistance Sitting-balance support: Feet supported Sitting balance-Leahy Scale: Good                                     ADL either performed or  assessed with clinical judgement   ADL Overall ADL's : Needs assistance/impaired Eating/Feeding: Independent   Grooming: Wash/dry hands;Wash/dry face;Oral care;Brushing hair;Set up;Bed level;Sitting   Upper Body Bathing: Minimal assistance;Sitting;Bed level   Lower Body Bathing: Maximal assistance;Bed level   Upper Body Dressing : Minimal assistance;Sitting;Bed level   Lower Body Dressing: Total assistance;Sit to/from stand Lower Body Dressing Details (indicate cue type and reason): Pt unable to access feet due to WOB and Rt LE pain  Toilet Transfer: Total assistance Toilet Transfer Details (indicate cue type and reason): unable to attempt due to Rt LE pain and SOB  Toileting- Clothing Manipulation and Hygiene: Total assistance;Bed level       Functional mobility during ADLs: Moderate assistance(bed mobility only )       Vision         Perception     Praxis      Pertinent Vitals/Pain Pain Assessment: 0-10 Pain Score: 10-Worst pain ever(when touched ) Pain Location: RLE Pain Descriptors / Indicators: Aching Pain Intervention(s): Limited activity within patient's tolerance;Monitored during session;Repositioned     Hand Dominance Right   Extremity/Trunk Assessment Upper Extremity Assessment Upper Extremity Assessment: Generalized weakness;LUE deficits/detail LUE Deficits / Details: Lt shoulder flexion limited to ~100-110*   Lower Extremity Assessment Lower Extremity Assessment: Defer to  PT evaluation   Cervical / Trunk Assessment Cervical / Trunk Assessment: Normal   Communication Communication Communication: No difficulties   Cognition Arousal/Alertness: Awake/alert Behavior During Therapy: WFL for tasks assessed/performed Overall Cognitive Status: Within Functional Limits for tasks assessed                                     General Comments  02 sats remained >94% on 4L supplemental 02.  DOE 3/4 with activity     Exercises      Shoulder Instructions      Home Living Family/patient expects to be discharged to:: Private residence Living Arrangements: Alone Available Help at Discharge: Personal care attendant;Available PRN/intermittently(7days/week for 2.5-3.5 hours) Type of Home: Apartment Home Access: Level entry     Home Layout: One level     Bathroom Shower/Tub: Producer, television/film/video: Standard     Home Equipment: Emergency planning/management officer - 4 wheels;Cane - single point;Bedside commode;Grab bars - tub/shower;Grab bars - toilet;Adaptive equipment(needs new 3 in 1) Adaptive Equipment: Reacher        Prior Functioning/Environment Level of Independence: Needs assistance  Gait / Transfers Assistance Needed: Uses cane for ambulation around the house; uses rollator if needed. ADL's / Homemaking Assistance Needed: Aide assists with bathing, IADL tasks.    Comments: Aide comes 7 days/week for 2.5-3.5 hours        OT Problem List: Decreased strength;Decreased range of motion;Decreased activity tolerance;Impaired balance (sitting and/or standing);Decreased knowledge of use of DME or AE;Cardiopulmonary status limiting activity;Obesity;Pain;Increased edema      OT Treatment/Interventions: Self-care/ADL training;Therapeutic exercise;Energy conservation;DME and/or AE instruction;Therapeutic activities;Patient/family education;Balance training    OT Goals(Current goals can be found in the care plan section) Acute Rehab OT Goals Patient Stated Goal: to feel better and regain independence  OT Goal Formulation: With patient Time For Goal Achievement: 05/23/18 Potential to Achieve Goals: Good ADL Goals Pt Will Perform Lower Body Bathing: with min assist;with adaptive equipment;sit to/from stand Pt Will Perform Lower Body Dressing: with min assist;sit to/from stand;with adaptive equipment Pt Will Transfer to Toilet: with min assist;ambulating;regular height toilet;bedside commode;grab bars Pt Will Perform  Toileting - Clothing Manipulation and hygiene: with min assist;sit to/from stand  OT Frequency: Min 2X/week   Barriers to D/C: Decreased caregiver support          Co-evaluation PT/OT/SLP Co-Evaluation/Treatment: Yes Reason for Co-Treatment: For patient/therapist safety;To address functional/ADL transfers   OT goals addressed during session: ADL's and self-care;Strengthening/ROM      AM-PAC PT "6 Clicks" Daily Activity     Outcome Measure Help from another person eating meals?: None Help from another person taking care of personal grooming?: A Little Help from another person toileting, which includes using toliet, bedpan, or urinal?: A Lot Help from another person bathing (including washing, rinsing, drying)?: A Lot Help from another person to put on and taking off regular upper body clothing?: A Little Help from another person to put on and taking off regular lower body clothing?: Total 6 Click Score: 15   End of Session Equipment Utilized During Treatment: Oxygen Nurse Communication: Mobility status  Activity Tolerance: No increased pain;Patient limited by fatigue Patient left: in bed;with call bell/phone within reach  OT Visit Diagnosis: Pain Pain - Right/Left: Right Pain - part of body: Leg                Time: 1610-9604 OT Time Calculation (min): 26  min Charges:  OT General Charges $OT Visit: 1 Visit OT Evaluation $OT Eval Moderate Complexity: 1 Mod G-Codes:     [employer], OTR/L [phone]   Jeani Hawking M 05/09/2018, 12:03 PM

## 2018-05-09 NOTE — Evaluation (Signed)
Physical Therapy Evaluation Patient Details Name: Brenda Clark MRN: [id number] DOB: 1950/12/30 Today's Date: 05/09/2018   History of Present Illness  This 67 y.o. female admitted with increased Rt LE edema and increasing SOB.  Hbg 6.5 on admission - GI consulted for possible GI bleed, work up underway.  Doppler of Rt LE negaive for DVT/SVT.  PMH includes:  h/o PE, morbid obesity, DM, retinal detachment, OSA, COPD (does not wear 02 at home)  Clinical Impression  Pt admitted secondary to problem above with deficits below. Pt limited to basic bed mobility secondary to SOB and RLE pain. Pt requiring min to mod A for bed mobility tasks. Unable to attempt standing this session. Given current deficits, feel pt is at increased risk for falls and would benefit from SNF at d/c. Will continue to follow acutely to maximize functional mobility independence and safety.     Follow Up Recommendations SNF;Supervision/Assistance - 24 hour    Equipment Recommendations  None recommended by PT    Recommendations for Other Services       Precautions / Restrictions Precautions Precautions: Fall Restrictions Weight Bearing Restrictions: No      Mobility  Bed Mobility Overal bed mobility: Needs Assistance Bed Mobility: Supine to Sit;Sit to Supine     Supine to sit: Min assist Sit to supine: Mod assist   General bed mobility comments: assist for LEs   Transfers Overall transfer level: Needs assistance               General transfer comment: unable to attempt due to Rt LE pain   Ambulation/Gait                Stairs            Wheelchair Mobility    Modified Rankin (Stroke Patients Only)       Balance Overall balance assessment: Needs assistance Sitting-balance support: Feet supported Sitting balance-Leahy Scale: Good                                       Pertinent Vitals/Pain Pain Assessment: 0-10 Pain Score: 10-Worst pain ever(when touched  ) Pain Location: RLE Pain Descriptors / Indicators: Aching Pain Intervention(s): Limited activity within patient's tolerance;Monitored during session;Repositioned    Home Living Family/patient expects to be discharged to:: Private residence Living Arrangements: Alone Available Help at Discharge: Personal care attendant;Available PRN/intermittently(7days/week for 2.5-3.5 hours) Type of Home: Apartment Home Access: Level entry     Home Layout: One level Home Equipment: Emergency planning/management officer - 4 wheels;Cane - single point;Bedside commode;Grab bars - tub/shower;Grab bars - toilet;Adaptive equipment(needs new 3 in 1)      Prior Function Level of Independence: Needs assistance   Gait / Transfers Assistance Needed: Uses cane for ambulation around the house; uses rollator if needed.  ADL's / Homemaking Assistance Needed: Aide assists with bathing, IADL tasks.   Comments: Aide comes 7 days/week for 2.5-3.5 hours     Hand Dominance   Dominant Hand: Right    Extremity/Trunk Assessment   Upper Extremity Assessment Upper Extremity Assessment: Defer to OT evaluation LUE Deficits / Details: Lt shoulder flexion limited to ~100-110*    Lower Extremity Assessment Lower Extremity Assessment: Generalized weakness;RLE deficits/detail RLE Deficits / Details: RLE movement limited secondary to increased pain in calf down to ankle. Able to perform partial LAQ in sitting.     Cervical / Trunk Assessment Cervical / Trunk  Assessment: Normal  Communication   Communication: No difficulties  Cognition Arousal/Alertness: Awake/alert Behavior During Therapy: WFL for tasks assessed/performed Overall Cognitive Status: Within Functional Limits for tasks assessed                                        General Comments General comments (skin integrity, edema, etc.): 02 sats remained >94% on 4L supplemental 02.  DOE 3/4 with activity     Exercises     Assessment/Plan    PT  Assessment Patient needs continued PT services  PT Problem List Decreased strength;Decreased activity tolerance;Decreased balance;Decreased mobility;Decreased knowledge of use of DME;Pain       PT Treatment Interventions DME instruction;Gait training;Functional mobility training;Therapeutic activities;Therapeutic exercise;Balance training;Patient/family education    PT Goals (Current goals can be found in the Care Plan section)  Acute Rehab PT Goals Patient Stated Goal: to feel better and regain independence  PT Goal Formulation: With patient Time For Goal Achievement: 10/25/18 Potential to Achieve Goals: Good    Frequency Min 2X/week   Barriers to discharge        Co-evaluation PT/OT/SLP Co-Evaluation/Treatment: Yes Reason for Co-Treatment: For patient/therapist safety;To address functional/ADL transfers PT goals addressed during session: Mobility/safety with mobility;Balance OT goals addressed during session: ADL's and self-care;Strengthening/ROM       AM-PAC PT "6 Clicks" Daily Activity  Outcome Measure Difficulty turning over in bed (including adjusting bedclothes, sheets and blankets)?: Unable Difficulty moving from lying on back to sitting on the side of the bed? : Unable Difficulty sitting down on and standing up from a chair with arms (e.g., wheelchair, bedside commode, etc,.)?: Unable Help needed moving to and from a bed to chair (including a wheelchair)?: Total Help needed walking in hospital room?: Total Help needed climbing 3-5 steps with a railing? : Total 6 Click Score: 6    End of Session   Activity Tolerance: Patient limited by pain Patient left: in bed;with call bell/phone within reach Nurse Communication: Mobility status PT Visit Diagnosis: Difficulty in walking, not elsewhere classified (R26.2);Muscle weakness (generalized) (M62.81);Pain Pain - Right/Left: Right Pain - part of body: Leg    Time: 1610-9604 PT Time Calculation (min) (ACUTE ONLY): 26  min   Charges:   PT Evaluation $PT Eval Moderate Complexity: 1 Mod     PT G Codes:        Gladys Damme, PT, DPT  Acute Rehabilitation Services  Pager: [phone]   Lehman Prom 05/09/2018, 12:52 PM

## 2018-05-09 NOTE — ED Notes (Signed)
5W to call back for report 

## 2018-05-09 NOTE — ED Notes (Signed)
Breakfast tray ordered 

## 2018-05-09 NOTE — Consult Note (Signed)
Subjective:   HPI  The patient is a 67-year-old female with multiple medical problems including morbid obesity and COPD. She also has a prior history of iron deficiency anemia. In 2015 she underwent a colonoscopy showing diverticulosis. Also in 2015 she had an EGD showing a large hiatal hernia with Cameron erosions. She has been on iron supplementation and therefore her stools are always dark. She has not really noticed any change. She has not seen any bright red blood in her stool. She is not on any blood thinners. She was admitted to the hospitalwith progressive weakness and found to be anemic.  Review of Systems No chest pain or shortness of breath  Past Medical History:  Diagnosis Date  . Arthritis    DDD and HNP on MRI in 2008  . 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 Chronic pain   . COPD (chronic obstructive pulmonary disease) (HCC)   . Endometrial polyp 08/2010   biopsied.   . GERD (gastroesophageal reflux disease)   . Headache, migraine   . Hiatal hernia 03/05/2014  . Hyperlipidemia   . Iron deficiency anemia, unspecified    ice pica  . Migraine, unspecified, without mention of intractable migraine without mention of status migrainosus   . Morbid obesity (HCC)   . OSA (obstructive sleep apnea)   . Osteoarthrosis, unspecified whether generalized or localized, lower leg   . Other and unspecified hyperlipidemia   . Pulmonary embolus (HCC) 2012  . Retinal detachment   . Type II or unspecified type diabetes mellitus without mention of complication, not stated as uncontrolled   . Unspecified essential hypertension    Past Surgical History:  Procedure Laterality Date  . CATARACT EXTRACTION    . COLONOSCOPY WITH PROPOFOL N/A 03/05/2014   Procedure: COLONOSCOPY WITH PROPOFOL;  Surgeon: Carl E Gessner, MD;  Location: [facility];  Service: Endoscopy;  Laterality: N/A;  . ESOPHAGOGASTRODUODENOSCOPY (EGD) WITH PROPOFOL N/A 03/05/2014   Procedure: ESOPHAGOGASTRODUODENOSCOPY (EGD) WITH PROPOFOL;   Surgeon: Carl E Gessner, MD;  Location: [facility];  Service: Endoscopy;  Laterality: N/A;  . LUMBAR DISC SURGERY    . TUBAL LIGATION     Social History   Socioeconomic History  . Marital status: Widowed    Spouse name: Not on file  . Number of children: 2  . Years of education: HS  . Highest education level: Not on file  Occupational History  . Occupation: DISABLED    Employer: DISABLED    Comment: since 2007  Social Needs  . Financial resource strain: Not on file  . Food insecurity:    Worry: Not on file    Inability: Not on file  . Transportation needs:    Medical: Not on file    Non-medical: Not on file  Tobacco Use  . Smoking status: Never Smoker  . Smokeless tobacco: Never Used  Substance and Sexual Activity  . Alcohol use: No  . Drug use: No  . Sexual activity: Not Currently  Lifestyle  . Physical activity:    Days per week: Not on file    Minutes per session: Not on file  . Stress: Not on file  Relationships  . Social connections:    Talks on phone: Not on file    Gets together: Not on file    Attends religious service: Not on file    Active member of club or organization: Not on file    Attends meetings of clubs or organizations: Not on file    Relationship status: Not on   file  . Intimate partner violence:    Fear of current or ex partner: Not on file    Emotionally abused: Not on file    Physically abused: Not on file    Forced sexual activity: Not on file  Other Topics Concern  . Not on file  Social History Narrative   Patient with obesity, hyperventilation and obstructive apneas, successfully treated with CPAP at 10 cm water. Good residual AHI values for the last year and low Epworth score. Compliance is good. [facility] follows her.    Her GERD is better on omeprazole and this has further reduced her nocturnal awakenings. She is still hypertensive and needs urgently to lose weight, this would positivley affect her diabetes as well.       I  convinced her to take her Topamax and take her Lasix in the AM at 20 mg. She was advised of low carb diet and potassium rich foods.       Patient lives at home alone.   Caffeine Use: occasionally   family history includes Breast cancer in her sister; Colon cancer in her father; Diabetes in her sister; Heart failure in her sister.  Current Facility-Administered Medications:  .  acetaminophen (TYLENOL) tablet 650 mg, 650 mg, Oral, Q6H PRN **OR** acetaminophen (TYLENOL) suppository 650 mg, 650 mg, Rectal, Q6H PRN, Wertman, Sara E, PA-C .  albuterol (PROVENTIL) (2.5 MG/3ML) 0.083% nebulizer solution 2.5 mg, 2.5 mg, Nebulization, Q4H PRN, Wertman, Sara E, PA-C .  bisacodyl (DULCOLAX) suppository 10 mg, 10 mg, Rectal, Daily PRN, Wertman, Sara E, PA-C .  diclofenac sodium (VOLTAREN) 1 % transdermal gel 2 g, 2 g, Topical, QID, Wertman, Sara E, PA-C, 2 g at 05/09/2018 2011 .  famotidine (PEPCID) IVPB 20 mg premix, 20 mg, Intravenous, Q12H, Wertman, Sara E, PA-C, Stopped at 05/09/18 0227 .  ferrous sulfate tablet 325 mg, 325 mg, Oral, BID WC, Wertman, Sara E, PA-C, 325 mg at 05/09/18 0857 .  furosemide (LASIX) injection 40 mg, 40 mg, Intravenous, Q12H, Joseph, Preetha, MD, 40 mg at 05/09/18 0448 .  furosemide (LASIX) injection 40 mg, 40 mg, Intravenous, Once, Nettey, Shayla D, MD .  HYDROcodone-acetaminophen (NORCO/VICODIN) 5-325 MG per tablet 1-2 tablet, 1-2 tablet, Oral, Q4H PRN, Wertman, Sara E, PA-C .  insulin aspart (novoLOG) injection 0-20 Units, 0-20 Units, Subcutaneous, TID WC, Wertman, Sara E, PA-C, 3 Units at 05/09/18 0856 .  ipratropium-albuterol (DUONEB) 0.5-2.5 (3) MG/3ML nebulizer solution 3 mL, 3 mL, Nebulization, Q6H, Nettey, Shayla D, MD, 3 mL at 05/09/18 0859 .  ondansetron (ZOFRAN) tablet 4 mg, 4 mg, Oral, Q6H PRN **OR** ondansetron (ZOFRAN) injection 4 mg, 4 mg, Intravenous, Q6H PRN, Wertman, Sara E, PA-C .  predniSONE (DELTASONE) tablet 40 mg, 40 mg, Oral, Q breakfast, Nettey, Shayla D,  MD, 40 mg at 05/09/18 0857 .  senna-docusate (Senokot-S) tablet 1 tablet, 1 tablet, Oral, QHS PRN, Wertman, Sara E, PA-C No Known Allergies   Objective:     BP (!) 144/57 (BP Location: Left Wrist)   Pulse 93   Temp 98.2 F (36.8 C) (Oral)   Resp 16   Ht 5' 3" (1.6 m)   Wt (!) 181.9 kg (401 lb)   SpO2 100%   BMI 71.03 kg/m   Obese  No distress  Heart regular rhythm no murmurs  Lungs clear  Abdomen obese, nontender  Laboratory No components found for: D1    Assessment:     Anemia. Believe this would be secondary to blood loss. Most   likely source would be hiatal hernia with Cameron erosions.      Plan:     Transfuse blood as needed. This has already been done and hemoglobin has improved. EGD to investigate upper GI tract. I do not believe another colonoscopy would be needed. Lab Results  Component Value Date   HGB 7.5 (L) 05/09/2018   HGB 6.5 (LL) 04/23/2018   HGB 9.2 (L) 06/26/2017   HGB 9.6 (L) 05/23/2017   HCT 27.0 (L) 05/09/2018   HCT 25.2 (L) 05/02/2018   HCT 31.7 (L) 06/26/2017   HCT 32.1 (L) 05/23/2017   ALKPHOS 92 06/24/2017   ALKPHOS 83 01/19/2016   ALKPHOS 90 01/17/2015   AST 19 06/24/2017   AST 12 01/19/2016   AST 14 01/17/2015   ALT 13 (L) 06/24/2017   ALT 8 01/19/2016   ALT 10 01/17/2015   

## 2018-05-09 NOTE — Progress Notes (Signed)
PROGRESS NOTE  LADORIS LYTHGOE VWU:[id number] DOB: Apr 15, 1951 DOA: 04/19/2018 PCP: Claiborne Rigg, NP  HPI  Brenda Clark is a 67 y.o. year old female with medical history significant for morbid obesity, COPD, OSA, arthritis of the knees, history of PE in 2012, diabetes type 2, history of migraines, history of iron deficiency anemia on chronic iron, with pica  who presented on 04/19/2018 with progressive dyspnea on exertion right lower extremity swelling and was found to have low hemoglobin concerning for symptomatic anemia and possible CHF/COPD exacerbation.  Interval History Became concerned when starting craving ice about a month ago which she knows happens when her blood is low. Two weeks ago she noticed increased swelling in her left leg and worsened shortness of breath when walking in her house.  Her lasix was increased by her PCP to 40 mg daily. She was seen by her foot doctor prior to admission who advised her to go to ED because of the significant swelling in her right leg and worsening shortness of breath with concern for DVT. In the ED she was found to have a 3 g drop in hemoglobin and was given 2 U PRBC.  She denies any hemoptysis, hematemesis, changes in stool color ( chronically black with oral iron). No chest pain, no changes in orthopnea ( uses 2 pillows), no new medications, no nausea, vomiting or abdominal pain. Says her weight was previously 389 before the swelling and she is now over 400.      Subjective Feels short of breath.  Denies any pain.  No chest pain.  Assessment/Plan: Principal Problem:   Symptomatic anemia Active Problems:   Diabetes type 2, controlled (HCC)   Hyperlipidemia   MORBID OBESITY   Migraine headache   Osteoarthritis   INSOMNIA   SOB (shortness of breath)   Sleep apnea with use of continuous positive airway pressure (CPAP)   Obesity hypoventilation syndrome (HCC)   Hiatal hernia   HTN (hypertension)   Edema of both legs   Anemia, iron  deficiency   Chronic diastolic heart failure (HCC)   1. Acute on chronic anemia, symptomatic. Suspect slow blood loss from known Cameron lesions ( EGD in 2015) on chronic iron. No reported bleeding otherwise, no NSAIDs or anticoagulants used recently. S/p 2 U PRBC here, hgb up 6.5--7.5(baseline 9). Consult GI, serial CBC, continue oral iron  2. Bilateral leg swelling, left leg more prominent. DVT ruled out on doppler study. With SOB could be related to CHF exacerbation given negative study  3. Progressive Dyspnea on exertion, likely multifactorial. Suspect related to symptomatic anemia,  diastolic CHF exacerbation and COPD flare plus OSA/OHS. Wheezing and moderate respiratory distress could be either cardiac or pulmonary etiology. BNP wnl but usually normal in obese pts. CXR not volume up. Scheduled nebs, IV lasix and prednisone. If no improvement consider CTA to rule out PE, encouraged given no tachycardia, no oxygen requirement and negative doppler study  4. OSA- CPAP nightly  5. Type 2 diabetes, controlled.  Holding home metformin.  Monitor CBG, sliding scale insulin   Code Status: FULL   Family Communication: No Family at bedside   Disposition Plan: Improvement in breathing, workup of anemia, improvement in swelling    Consultants:  GI   Procedures:  None   Antimicrobials:  None   Cultures:  None  Telemetry:yes  DVT prophylaxis:  SCDs   Objective: Vitals:   05/09/18 0815 05/09/18 0900 05/09/18 1056 05/09/18 1601  BP: 136/63 118/73 (!) 144/57 Marland Kitchen)  147/66  Pulse: 99 97 93 85  Resp:  (!) 22 16 18   Temp:   98.2 F (36.8 C) 98.3 F (36.8 C)  TempSrc:   Oral Oral  SpO2: 98% 98% 100% 98%  Weight:      Height:        Intake/Output Summary (Last 24 hours) at 05/09/2018 1647 Last data filed at 05/09/2018 1500 Gross per 24 hour  Intake 200 ml  Output 1100 ml  Net -900 ml   Filed Weights   02-10-2018 1138  Weight: (!) 181.9 kg (401 lb)     Exam:  Constitutional: morbidly obese, otherwise normal appearing female Eyes: EOMI, anicteric, normal conjunctiva ENMT: Oropharynx with dry mucous membranes,  Cardiovascular: RRR no MRGs, with 1+ pitting edema mainly in right leg( from distal calf to knee) and non pitting edema of left leg Respiratory: increased respiratory effort on room air, no accessory muscle use, prominent audible wheezing, unable to auscultate any crackles on posterior exam due to body habitus  Abdomen: Soft,non-tender Skin: No rash ulcers, or lesions. Without skin tenting  Neurologic: Grossly no focal neuro deficit. Psychiatric:Appropriate affect, and mood. Mental status AAOx3  Data Reviewed: CBC: Recent Labs  Lab 02-10-2018 1152 05/09/18 0506  WBC 7.2 7.9  HGB 6.5* 7.5*  HCT 25.2* 27.0*  MCV 73.0* 74.0*  PLT 338 296   Basic Metabolic Panel: Recent Labs  Lab 02-10-2018 1152 05/09/18 0506  NA 139 137  K 4.1 4.2  CL 106 103  CO2 25 25  GLUCOSE 128* 131*  BUN 8 7  CREATININE 0.78 0.61  CALCIUM 9.0 8.6*   GFR: Estimated Creatinine Clearance: 109.2 mL/min (by C-G formula based on SCr of 0.61 mg/dL). Liver Function Tests: No results for input(s): AST, ALT, ALKPHOS, BILITOT, PROT, ALBUMIN in the last 168 hours. No results for input(s): LIPASE, AMYLASE in the last 168 hours. No results for input(s): AMMONIA in the last 168 hours. Coagulation Profile: No results for input(s): INR, PROTIME in the last 168 hours. Cardiac Enzymes: No results for input(s): CKTOTAL, CKMB, CKMBINDEX, TROPONINI in the last 168 hours. BNP (last 3 results) No results for input(s): PROBNP in the last 8760 hours. HbA1C: No results for input(s): HGBA1C in the last 72 hours. CBG: Recent Labs  Lab 02-10-2018 1736 02-10-2018 2224 05/09/18 0753  GLUCAP 105* 197* 144*   Lipid Profile: No results for input(s): CHOL, HDL, LDLCALC, TRIG, CHOLHDL, LDLDIRECT in the last 72 hours. Thyroid Function Tests: Recent Labs     02-10-2018 1831  TSH 1.809  FREET4 0.86   Anemia Panel: Recent Labs    02-10-2018 1613  VITAMINB12 153*  FOLATE 10.3  FERRITIN 6*  TIBC 420  IRON 10*  RETICCTPCT 2.4   Urine analysis:    Component Value Date/Time   COLORURINE YELLOW 06/24/2017 1125   APPEARANCEUR HAZY (A) 06/24/2017 1125   LABSPEC 1.015 06/24/2017 1125   PHURINE 6.0 06/24/2017 1125   GLUCOSEU NEGATIVE 06/24/2017 1125   HGBUR NEGATIVE 06/24/2017 1125   HGBUR large 08/24/2010 0832   BILIRUBINUR NEGATIVE 06/24/2017 1125   KETONESUR NEGATIVE 06/24/2017 1125   PROTEINUR NEGATIVE 06/24/2017 1125   UROBILINOGEN 0.2 03/28/2015 1151   NITRITE NEGATIVE 06/24/2017 1125   LEUKOCYTESUR MODERATE (A) 06/24/2017 1125   Sepsis Labs: @LABRCNTIP (procalcitonin:4,lacticidven:4)  )No results found for this or any previous visit (from the past 240 hour(s)).    Studies: No results found.  Scheduled Meds: . diclofenac sodium  2 g Topical QID  . ferrous sulfate  325  mg Oral BID WC  . furosemide  40 mg Intravenous Q12H  . furosemide  40 mg Intravenous Once  . insulin aspart  0-20 Units Subcutaneous TID WC  . ipratropium-albuterol  3 mL Nebulization Q6H  . predniSONE  40 mg Oral Q breakfast  . [START ON 05/31/2018] protein supplement shake  11 oz Oral BID BM    Continuous Infusions: . famotidine (PEPCID) IV Stopped (05/09/18 0227)     LOS: 1 day     Laverna Peace, MD [facility] Pager [phone]  If 7PM-7AM, please contact night-coverage [email or web address] Password [facility] 05/09/2018, 4:47 PM

## 2018-05-09 NOTE — ED Notes (Signed)
PT eating breakfast.

## 2018-05-09 NOTE — ED Notes (Signed)
While maintaining O2 levels, pt is working very hard to breath.  RN contacted respiratory to come and see pt. Pt will be given missed lasix dose.

## 2018-05-09 NOTE — ED Notes (Signed)
Report accepted by Melina Copa

## 2018-05-09 NOTE — Progress Notes (Signed)
  Echocardiogram 2D Echocardiogram has been performed.  Brenda Clark 05/09/2018, 1:24 PM

## 2018-05-09 NOTE — Progress Notes (Signed)
New Admission Note:   Arrival Method: Bed Mental Orientation: A&O x4 Telemetry: Placed and verified by second RN Assessment: Completed Skin: IV: Pain: Tubes: Safety Measures: Safety Fall Prevention Plan has been given, discussed and signed Admission: Completed [address] Orientation: Patient has been orientated to the room, unit and staff.  Family:  Orders have been reviewed and implemented. Will continue to monitor the patient. Call light has been placed within reach and bed alarm has been activated.   Fara Boros BSN, RN [employer] [facility] Phone number: [facility]

## 2018-05-09 NOTE — H&P (View-Only) (Signed)
Subjective:   HPI  The patient is a 67 year old female with multiple medical problems including morbid obesity and COPD. She also has a prior history of iron deficiency anemia. In 2015 she underwent a colonoscopy showing diverticulosis. Also in 2015 she had an EGD showing a large hiatal hernia with Sheria Lang erosions. She has been on iron supplementation and therefore her stools are always dark. She has not really noticed any change. She has not seen any bright red blood in her stool. She is not on any blood thinners. She was admitted to the hospitalwith progressive weakness and found to be anemic.  Review of Systems No chest pain or shortness of breath  Past Medical History:  Diagnosis Date  . Arthritis    DDD and HNP on MRI in 2008  . 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 Chronic pain   . COPD (chronic obstructive pulmonary disease) (HCC)   . Endometrial polyp 08/2010   biopsied.   Marland Kitchen GERD (gastroesophageal reflux disease)   . Headache, migraine   . Hiatal hernia 03/05/2014  . Hyperlipidemia   . Iron deficiency anemia, unspecified    ice pica  . Migraine, unspecified, without mention of intractable migraine without mention of status migrainosus   . Morbid obesity (HCC)   . OSA (obstructive sleep apnea)   . Osteoarthrosis, unspecified whether generalized or localized, lower leg   . Other and unspecified hyperlipidemia   . Pulmonary embolus (HCC) 2012  . Retinal detachment   . Type II or unspecified type diabetes mellitus without mention of complication, not stated as uncontrolled   . Unspecified essential hypertension    Past Surgical History:  Procedure Laterality Date  . CATARACT EXTRACTION    . COLONOSCOPY WITH PROPOFOL N/A 03/05/2014   Procedure: COLONOSCOPY WITH PROPOFOL;  Surgeon: Iva Boop, MD;  Location: [facility] ENDOSCOPY;  Service: Endoscopy;  Laterality: N/A;  . ESOPHAGOGASTRODUODENOSCOPY (EGD) WITH PROPOFOL N/A 03/05/2014   Procedure: ESOPHAGOGASTRODUODENOSCOPY (EGD) WITH PROPOFOL;   Surgeon: Iva Boop, MD;  Location: [facility] ENDOSCOPY;  Service: Endoscopy;  Laterality: N/A;  . LUMBAR DISC SURGERY    . TUBAL LIGATION     Social History   Socioeconomic History  . Marital status: Widowed    Spouse name: Not on file  . Number of children: 2  . Years of education: HS  . Highest education level: Not on file  Occupational History  . Occupation: DISABLED    Employer: DISABLED    Comment: since 2007  Social Needs  . Financial resource strain: Not on file  . Food insecurity:    Worry: Not on file    Inability: Not on file  . Transportation needs:    Medical: Not on file    Non-medical: Not on file  Tobacco Use  . Smoking status: Never Smoker  . Smokeless tobacco: Never Used  Substance and Sexual Activity  . Alcohol use: No  . Drug use: No  . Sexual activity: Not Currently  Lifestyle  . Physical activity:    Days per week: Not on file    Minutes per session: Not on file  . Stress: Not on file  Relationships  . Social connections:    Talks on phone: Not on file    Gets together: Not on file    Attends religious service: Not on file    Active member of club or organization: Not on file    Attends meetings of clubs or organizations: Not on file    Relationship status: Not on  file  . Intimate partner violence:    Fear of current or ex partner: Not on file    Emotionally abused: Not on file    Physically abused: Not on file    Forced sexual activity: Not on file  Other Topics Concern  . Not on file  Social History Narrative   Patient with obesity, hyperventilation and obstructive apneas, successfully treated with CPAP at 10 cm water. Good residual AHI values for the last year and low Epworth score. Compliance is good. [facility] follows her.    Her GERD is better on omeprazole and this has further reduced her nocturnal awakenings. She is still hypertensive and needs urgently to lose weight, this would positivley affect her diabetes as well.       I  convinced her to take her Topamax and take her Lasix in the AM at 20 mg. She was advised of low carb diet and potassium rich foods.       Patient lives at home alone.   Caffeine Use: occasionally   family history includes Breast cancer in her sister; Colon cancer in her father; Diabetes in her sister; Heart failure in her sister.  Current Facility-Administered Medications:  .  acetaminophen (TYLENOL) tablet 650 mg, 650 mg, Oral, Q6H PRN **OR** acetaminophen (TYLENOL) suppository 650 mg, 650 mg, Rectal, Q6H PRN, Gwynneth Munson, Sara E, PA-C .  albuterol (PROVENTIL) (2.5 MG/3ML) 0.083% nebulizer solution 2.5 mg, 2.5 mg, Nebulization, Q4H PRN, Wertman, Sara E, PA-C .  bisacodyl (DULCOLAX) suppository 10 mg, 10 mg, Rectal, Daily PRN, Gwynneth Munson, Sara E, PA-C .  diclofenac sodium (VOLTAREN) 1 % transdermal gel 2 g, 2 g, Topical, QID, Wertman, Sara E, PA-C, 2 g at 04/14/2018 2011 .  famotidine (PEPCID) IVPB 20 mg premix, 20 mg, Intravenous, Q12H, Marcos Eke, PA-C, Stopped at 05/09/18 0227 .  ferrous sulfate tablet 325 mg, 325 mg, Oral, BID WC, Marcos Eke, PA-C, 325 mg at 05/09/18 0857 .  furosemide (LASIX) injection 40 mg, 40 mg, Intravenous, Q12H, Zannie Cove, MD, 40 mg at 05/09/18 0448 .  furosemide (LASIX) injection 40 mg, 40 mg, Intravenous, Once, Roberto Scales D, MD .  HYDROcodone-acetaminophen (NORCO/VICODIN) 5-325 MG per tablet 1-2 tablet, 1-2 tablet, Oral, Q4H PRN, Gwynneth Munson, Sara E, PA-C .  insulin aspart (novoLOG) injection 0-20 Units, 0-20 Units, Subcutaneous, TID WC, Marcos Eke, PA-C, 3 Units at 05/09/18 [phone] .  ipratropium-albuterol (DUONEB) 0.5-2.5 (3) MG/3ML nebulizer solution 3 mL, 3 mL, Nebulization, Q6H, Roberto Scales D, MD, 3 mL at 05/09/18 0859 .  ondansetron (ZOFRAN) tablet 4 mg, 4 mg, Oral, Q6H PRN **OR** ondansetron (ZOFRAN) injection 4 mg, 4 mg, Intravenous, Q6H PRN, Wertman, Sara E, PA-C .  predniSONE (DELTASONE) tablet 40 mg, 40 mg, Oral, Q breakfast, Roberto Scales D,  MD, 40 mg at 05/09/18 0857 .  senna-docusate (Senokot-S) tablet 1 tablet, 1 tablet, Oral, QHS PRN, Gwynneth Munson, Sara E, PA-C No Known Allergies   Objective:     BP (!) 144/57 (BP Location: Left Wrist)   Pulse 93   Temp 98.2 F (36.8 C) (Oral)   Resp 16   Ht 5\' 3"  (1.6 m)   Wt (!) 181.9 kg (401 lb)   SpO2 100%   BMI 71.03 kg/m   Obese  No distress  Heart regular rhythm no murmurs  Lungs clear  Abdomen obese, nontender  Laboratory No components found for: D1    Assessment:     Anemia. Believe this would be secondary to blood loss. Most  likely source would be hiatal hernia with Sheria Langameron erosions.      Plan:     Transfuse blood as needed. This has already been done and hemoglobin has improved. EGD to investigate upper GI tract. I do not believe another colonoscopy would be needed. Lab Results  Component Value Date   HGB 7.5 (L) 05/09/2018   HGB 6.5 (LL) 04/27/2018   HGB 9.2 (L) 06/26/2017   HGB 9.6 (L) 05/23/2017   HCT 27.0 (L) 05/09/2018   HCT 25.2 (L) 05/04/2018   HCT 31.7 (L) 06/26/2017   HCT 32.1 (L) 05/23/2017   ALKPHOS 92 06/24/2017   ALKPHOS 83 01/19/2016   ALKPHOS 90 01/17/2015   AST 19 06/24/2017   AST 12 01/19/2016   AST 14 01/17/2015   ALT 13 (L) 06/24/2017   ALT 8 01/19/2016   ALT 10 01/17/2015

## 2018-05-09 NOTE — Progress Notes (Signed)
Initial Nutrition Assessment  DOCUMENTATION CODES:   Morbid obesity  INTERVENTION:   - Provided Heart Healthy Consistent Carbohydrate Nutrition Therapy handout and education  - Premier Protein BID, each provides 160 kcal and 30 grams protein  NUTRITION DIAGNOSIS:   Inadequate oral intake related to poor appetite, lethargy/confusion, other (see comment)(leg pain upon standing, decreased energy levels) as evidenced by per patient/family report.  GOAL:   Patient will meet greater than or equal to 90% of their needs  MONITOR:   PO intake, Supplement acceptance, Weight trends, I & O's, Labs  REASON FOR ASSESSMENT:   Consult Assessment of nutrition requirement/status, Diet education  ASSESSMENT:   67 year old female who presented to the ED with SOB x 1 week and leg swelling. PMH significant for COPD, GERD, diverticulosis, hiatal hernia, Cameron lesions, hyperlipidemia, diabetes mellitus type 2, iron-deficiency anemia on chronic iron, and pica.  Spoke with pt at bedside. Pt reports poor appetite x 1 month. Pt reports poor appetite began when she started having leg pain. Pt eats 1-2 meals daily. Breakfast may include toast, eggs, and grits or oatmeal. Lunch is skipped. Dinner may include a small sandwich. Pt reports not eating as much as normal due to not being able to stand up for extended periods of time and having low energy.  Pt reports that she does not use salt while cooking or at the table. Pt is aware of carbohydrate-containing foods and checks her blood sugars. Pt bakes most of her food.  Discussed different food groups and their effects on blood sugar, emphasizing carbohydrate-containing foods. Provided list of carbohydrates and recommended serving sizes of common foods. Discussed importance of controlled and consistent carbohydrate intake throughout the day. Provided examples of ways to balance meals/snacks and encouraged intake of high-fiber, whole grain complex  carbohydrates. Teach back method used.  Provided examples on ways to decrease sodium and fat intake in diet. Discouraged intake of processed foods and use of salt shaker. Encouraged fresh fruits and vegetables as well as whole grain sources of carbohydrates to maximize fiber intake.  Expect fair compliance.  Pt reports she has lost weight but that her weight fluctuates greatly due to fluid. Pt is unsure of her UBW due to fluctuations.  Pt reports eating "okay" at breakfast this morning which included a couple bites of eggs and [place] toast. Pt reports eating "better" at lunch which included a grilled chicken salad. Pt agreeable to receiving Premier Protein oral nutrition supplement to maximize protein intake during current admission and preserve lean body mass.  Meal Completion: 100% at lunch today  Medications reviewed and include: 325 mg ferrous sulfate BID, 40 mg Lasix BID, sliding scale Novolog, 20 mg Pepcid BID,   Labs reviewed: hemoglobin 7.5 (L), HCT 27.0 (L) CBG's: 144, 197, 105 x 24 hours  Lipid Panel     Component Value Date/Time   CHOL 214 (H) 10/23/2016 1123   TRIG 128 10/23/2016 1123   HDL 57 10/23/2016 1123   CHOLHDL 3.8 10/23/2016 1123   VLDL 26 10/23/2016 1123   LDLCALC 131 (H) 10/23/2016 1123    NUTRITION - FOCUSED PHYSICAL EXAM:    Most Recent Value  Orbital Region  Mild depletion  Upper Arm Region  No depletion  Thoracic and Lumbar Region  No depletion  Buccal Region  Mild depletion  Temple Region  Mild depletion  Clavicle Bone Region  No depletion  Clavicle and Acromion Bone Region  No depletion  Scapular Bone Region  No depletion  Dorsal Hand  No  depletion  Patellar Region  No depletion  Anterior Thigh Region  No depletion  Posterior Calf Region  Mild depletion  Edema (RD Assessment)  Moderate  Hair  Reviewed  Eyes  Reviewed  Mouth  Reviewed [poor dentition]  Skin  Reviewed  Nails  Reviewed       Diet Order:   Diet Order           Diet NPO  time specified  Diet effective midnight        Diet heart healthy/carb modified Room service appropriate? Yes; Fluid consistency: Thin  Diet effective now          EDUCATION NEEDS:   Education needs have been addressed  Skin:  Skin Assessment: Reviewed RN Assessment  Last BM:  unknown/PTA  Height:   Ht Readings from Last 1 Encounters:  05/07/2018 5\' 3"  (1.6 m)    Weight:   Wt Readings from Last 1 Encounters:  04/22/2018 (!) 401 lb (181.9 kg)    Ideal Body Weight:  52.3 kg  BMI:  Body mass index is 71.03 kg/m.  Estimated Nutritional Needs:   Kcal:  2300-2500 kcal/day  Protein:  120-135 grams/day  Fluid:  2.3-2.5 L/day    Earma ReadingKate Jablonski Yancy Knoble, MS, RD, LDN Pager: [phone] Weekend/After Hours: [phone]

## 2018-05-10 ENCOUNTER — Inpatient Hospital Stay (HOSPITAL_COMMUNITY): Payer: Medicare Other | Admitting: Certified Registered Nurse Anesthetist

## 2018-05-10 ENCOUNTER — Encounter (HOSPITAL_COMMUNITY): Admission: EM | Disposition: E | Payer: Self-pay | Source: Home / Self Care | Attending: Internal Medicine

## 2018-05-10 ENCOUNTER — Encounter (HOSPITAL_COMMUNITY): Payer: Self-pay

## 2018-05-10 HISTORY — PX: ESOPHAGOGASTRODUODENOSCOPY (EGD) WITH PROPOFOL: SHX5813

## 2018-05-10 HISTORY — PX: BIOPSY: SHX5522

## 2018-05-10 LAB — CBC
HCT: 27.9 % — ABNORMAL LOW (ref 36.0–46.0)
Hemoglobin: 7.5 g/dL — ABNORMAL LOW (ref 12.0–15.0)
MCH: 20.4 pg — AB (ref 26.0–34.0)
MCHC: 26.9 g/dL — ABNORMAL LOW (ref 30.0–36.0)
MCV: 75.8 fL — AB (ref 78.0–100.0)
PLATELETS: 267 10*3/uL (ref 150–400)
RBC: 3.68 MIL/uL — AB (ref 3.87–5.11)
RDW: 21.5 % — AB (ref 11.5–15.5)
WBC: 7.4 10*3/uL (ref 4.0–10.5)

## 2018-05-10 LAB — GLUCOSE, CAPILLARY
GLUCOSE-CAPILLARY: 124 mg/dL — AB (ref 65–99)
Glucose-Capillary: 113 mg/dL — ABNORMAL HIGH (ref 65–99)
Glucose-Capillary: 185 mg/dL — ABNORMAL HIGH (ref 65–99)
Glucose-Capillary: 202 mg/dL — ABNORMAL HIGH (ref 65–99)

## 2018-05-10 SURGERY — ESOPHAGOGASTRODUODENOSCOPY (EGD) WITH PROPOFOL
Anesthesia: Monitor Anesthesia Care

## 2018-05-10 MED ORDER — PROPOFOL 10 MG/ML IV BOLUS
INTRAVENOUS | Status: DC | PRN
  Administered 2018-05-10 (×2): 20 mg via INTRAVENOUS

## 2018-05-10 MED ORDER — LACTATED RINGERS IV SOLN
INTRAVENOUS | Status: DC
  Administered 2018-05-10: 09:00:00 via INTRAVENOUS

## 2018-05-10 MED ORDER — SODIUM CHLORIDE 0.9 % IV SOLN: INTRAVENOUS | Status: DC

## 2018-05-10 MED ORDER — ONDANSETRON HCL 4 MG/2ML IJ SOLN: 4.0000 mg | Freq: Once | INTRAMUSCULAR | Status: DC | PRN

## 2018-05-10 MED ORDER — PROPOFOL 500 MG/50ML IV EMUL
INTRAVENOUS | Status: DC | PRN
  Administered 2018-05-10: 80 ug/kg/min via INTRAVENOUS

## 2018-05-10 MED ORDER — FUROSEMIDE 10 MG/ML IJ SOLN
40.0000 mg | Freq: Two times a day (BID) | INTRAMUSCULAR | Status: DC
  Administered 2018-05-10 – 2018-05-11 (×2): 40 mg via INTRAVENOUS
  Filled 2018-05-10 (×2): qty 4

## 2018-05-10 MED ORDER — IPRATROPIUM-ALBUTEROL 0.5-2.5 (3) MG/3ML IN SOLN
3.0000 mL | Freq: Three times a day (TID) | RESPIRATORY_TRACT | Status: DC
  Administered 2018-05-10 – 2018-05-11 (×4): 3 mL via RESPIRATORY_TRACT
  Filled 2018-05-10 (×5): qty 3

## 2018-05-10 MED ORDER — FENTANYL CITRATE (PF) 100 MCG/2ML IJ SOLN: 25.0000 ug | INTRAMUSCULAR | Status: DC | PRN

## 2018-05-10 SURGICAL SUPPLY — 15 items

## 2018-05-10 NOTE — Interval H&P Note (Signed)
History and Physical Interval Note:  05/15/2018 9:30 AM  Brenda CrockerMary W Thall  has presented today for surgery, with the diagnosis of anemia  The various methods of treatment have been discussed with the patient and family. After consideration of risks, benefits and other options for treatment, the patient has consented to  Procedure(s): ESOPHAGOGASTRODUODENOSCOPY (EGD) WITH PROPOFOL (N/A) as a surgical intervention .  The patient's history has been reviewed, patient examined, no change in status, stable for surgery.  I have reviewed the patient's chart and labs.  Questions were answered to the patient's satisfaction.     Florencia ReasonsBUCCINI,Lenae Wherley V

## 2018-05-10 NOTE — Op Note (Signed)
[facility] Patient Name: Brenda Clark Procedure Date : 06/04/2018 MRN: [id number] Attending MD: Bernette Redbird , MD Date of Birth: 1951/04/29 CSN: [id number] Age: 67 Admit Type: Inpatient Procedure:                Upper GI endoscopy Indications:              Iron deficiency anemia in a patient with heme                            negative stool and a known large hiatal hernia (on                            egd by Dr. Leone Payor 4 yrs ago) Providers:                Bernette Redbird, MD, Leandrew Koyanagi, RN, Judithann Sauger, Technician, Dairl Ponder, CRNA Referring MD:              Medicines:                Monitored Anesthesia Care Complications:            No immediate complications. Estimated Blood Loss:     Estimated blood loss was minimal. Procedure:                Pre-Anesthesia Assessment:                           - Prior to the procedure, a History and Physical                            was performed, and patient medications and                            allergies were reviewed. The patient's tolerance of                            previous anesthesia was also reviewed. The risks                            and benefits of the procedure and the sedation                            options and risks were discussed with the patient.                            All questions were answered, and informed consent                            was obtained. Prior Anticoagulants: The patient has                            taken no previous anticoagulant or antiplatelet  agents. ASA Grade Assessment: III - A patient with                            severe systemic disease. After reviewing the risks                            and benefits, the patient was deemed in                            satisfactory condition to undergo the procedure.                           After obtaining informed consent, the endoscope was            passed under direct vision. Throughout the                            procedure, the patient's blood pressure, pulse, and                            oxygen saturations were monitored continuously. The                            [id number] ([id number]) scope was introduced through the                            mouth, and advanced to the second part of duodenum.                            The upper GI endoscopy was accomplished without                            difficulty. The patient tolerated the procedure                            well. Scope In: Scope Out: Findings:      The larynx was grossly normal.      The examined esophagus was normal. Specifically, no reflux esophagitis       was present.      A 10 cm hiatal hernia was found. The proximal extent of the gastric       folds (end of tubular esophagus) was 30 cm from the incisors. The hiatal       narrowing was 40 cm from the incisors. The Z-line was 30 cm from the       incisors.      Bilious fluid was found refluxing all the way up into the distal       esophagus, and into the hiatal hernia pouch.      The exam of the stomach was otherwise normal.      The cardia and gastric fundus were normal on retroflexion, apart from a       very patulous diaphragmatic hiatus. Careful examination for Sheria LangCameron       erosions was negative.      The examined duodenum was normal. Biopsies were taken with a cold       forceps for  histology. Estimated blood loss was minimal. Impression:               - Normal larynx.                           - Normal esophagus.                           - 10 cm hiatal hernia.                           - Bile reflux.                           - Normal examined duodenum. Biopsied. Recommendation:           - Await pathology results.                           - Please see progress notes for other                            recommendations: ok for discharge, monitor hgb,                            iron  infusion/?heme referral, monitor hemoccults                            (with colonoscopy if positive), consideration for                            ??surgical referral for repair of large hiatal                            hernia Procedure Code(s):        --- Professional ---                           [phone], Esophagogastroduodenoscopy, flexible,                            transoral; with biopsy, single or multiple Diagnosis Code(s):        --- Professional ---                           D50.9, Iron deficiency anemia, unspecified                           K44.9, Diaphragmatic hernia without obstruction or                            gangrene CPT copyright 2017 American Medical Association. All rights reserved. The codes documented in this report are preliminary and upon coder review may  be revised to meet current compliance requirements. Bernette Redbird, MD 06/01/2018 9:58:41 AM This report has been signed electronically. Number of Addenda: 0

## 2018-05-10 NOTE — Anesthesia Preprocedure Evaluation (Signed)
Anesthesia Evaluation  Patient identified by MRN, date of birth, ID band Patient awake    Reviewed: Allergy & Precautions, NPO status , Patient's Chart, lab work & pertinent test results  Airway Mallampati: III  TM Distance: >3 FB Neck ROM: Full    Dental  (+) Teeth Intact   Pulmonary    breath sounds clear to auscultation       Cardiovascular hypertension,  Rhythm:Regular Rate:Normal     Neuro/Psych    GI/Hepatic   Endo/Other  diabetes  Renal/GU      Musculoskeletal   Abdominal (+) + obese,   Peds  Hematology   Anesthesia Other Findings   Reproductive/Obstetrics                             Anesthesia Physical Anesthesia Plan  ASA: III  Anesthesia Plan: MAC   Post-op Pain Management:    Induction: Intravenous  PONV Risk Score and Plan:   Airway Management Planned: Natural Airway and Nasal Cannula  Additional Equipment:   Intra-op Plan:   Post-operative Plan:   Informed Consent: I have reviewed the patients History and Physical, chart, labs and discussed the procedure including the risks, benefits and alternatives for the proposed anesthesia with the patient or authorized representative who has indicated his/her understanding and acceptance.     Plan Discussed with: CRNA and Anesthesiologist  Anesthesia Plan Comments:         Anesthesia Quick Evaluation

## 2018-05-10 NOTE — Transfer of Care (Signed)
Immediate Anesthesia Transfer of Care Note  Patient: Brenda Clark  Procedure(s) Performed: ESOPHAGOGASTRODUODENOSCOPY (EGD) WITH PROPOFOL (N/A ) BIOPSY  Patient Location: Endoscopy Unit  Anesthesia Type:MAC  Level of Consciousness: awake, alert  and oriented  Airway & Oxygen Therapy: Patient Spontanous Breathing and Patient connected to nasal cannula oxygen  Post-op Assessment: Report given to RN and Post -op Vital signs reviewed and stable  Post vital signs: Reviewed and stable  Last Vitals:  Vitals Value Taken Time  BP 125/43 05/27/2018  9:47 AM  Temp 36.6 C 06/06/2018  9:47 AM  Pulse 76 05/14/2018  9:47 AM  Resp 31 05/29/2018  9:47 AM  SpO2 92 % 06/08/2018  9:47 AM  Vitals shown include unvalidated device data.  Last Pain:  Vitals:   05/21/2018 0947  TempSrc: Oral  PainSc: 0-No pain      Patients Stated Pain Goal: 5 (41/44/36 0165)  Complications: No apparent anesthesia complications

## 2018-05-10 NOTE — Progress Notes (Addendum)
Patient tolerated endoscopy well.  Please see dictated procedure report.  Very large hiatal hernia (previously noted), but currently no Cameron erosions.  Nonetheless, it is noteworthy that large hiatal hernias can account for iron deficiency anemia, even in the absence of such erosions.  In addition, the patient's chronic PPI therapy could be contributing to her anemia, by inhibiting absorption of oral iron.  Have ordered for patient's diet to be advanced.  Recommendations:  1.  In view of heme negative stool on admission and absence of acute lesions on current endoscopy, I think discharge at any time is acceptable.  2.  Since the patient is up-to-date on colonoscopy (last done 4 years ago), I do not feel an updated colonoscopy is needed at this time.  3.  However, I would recommend that following discharge, the patient have periodic Hemoccult monitoring; if she should show heme positivity, would consider colonoscopy.  4.  Following discharge, I would recommend consideration of intravenous iron, since this patient became anemic despite oral iron therapy.  This could be done by her primary physician or via hematology consultation.  5.  This patient's anemia could probably be solved permanently by surgical repair of her hiatal hernia.  However, given her morbid obesity, it is not clear whether the risk of that intervention would be justified, particularly if periodic iron infusions can maintain an adequate hemoglobin in this patient.  Note that her reflux symptoms are fairly adequately controlled by medical (PPI) therapy, which I would continue post discharge for that reason.  6.  I will take care of contacting the patient with her duodenal biopsy results.  I obtained those biopsies to check for celiac disease, which can be a cause of isolated iron deficiency anemia in the absence of other malabsorptive symptoms such as diarrhea.  However, celiac disease is somewhat uncommon in  African-Americans.  We will sign off.  Please call us if you have questions or if we can be of further assistance in her care.  Florencia Reasonsobert V. Trannie Bardales, M.D. Pager [phone] If no answer or after 5 PM call [phone]

## 2018-05-10 NOTE — Progress Notes (Addendum)
PROGRESS NOTE  Brenda Clark ZOX:[id number] DOB: 01-22-1951 DOA: 04/24/2018 PCP: Claiborne Rigg, NP  HPI  Brenda Clark is a 67 y.o. year old female with medical history significant for morbid obesity, COPD, OSA, arthritis of the knees, history of PE in 2012, diabetes type 2, history of migraines, history of iron deficiency anemia on chronic iron, with pica  who presented on 04/21/2018 with progressive dyspnea on exertion for several weeks and right lower extremity swelling concerning for a DVT and was found to have low hemoglobin concerning for symptomatic anemia and possible CHF exacerbation and OHS. Her anemia of 6.5 was treated with 2 U PRBC and improved, her FOBT was negative, and no active bleeding during admission. Underwent EGD on 6/1 which showed no active bleeding and no current Cameron lesions. Venous duplex was negative for DVT and she was started on scheduled nebs and IV lasix for her respiratory issues   Subjective Feels short of breath.  Denies any pain.  No chest pain.  Assessment/Plan: Principal Problem:   Symptomatic anemia Active Problems:   Diabetes type 2, controlled (HCC)   Hyperlipidemia   MORBID OBESITY   Migraine headache   Osteoarthritis   INSOMNIA   SOB (shortness of breath)   Sleep apnea with use of continuous positive airway pressure (CPAP)   Obesity hypoventilation syndrome (HCC)   Hiatal hernia   HTN (hypertension)   Edema of both legs   Anemia, iron deficiency   Chronic diastolic heart failure (HCC)   1. Acute on chronic anemia, symptomatic, stable after 2 U PRBC ( hgb 6.5--7.5--8.1--7.5). Iron panel consistent with iron deficiency anemia.  EGD today with no active bleed or Cameron lesions (previously had some). Possibly related to malabsorption from large hiatal hernia per GI + chronic PPI. Serial CBC, GI to f/u duodenal biopsy results ( to rule out Celiac). Will likely need IV iron as outpatient  o maintain hemoglobin since she's not a great  surgical candidate for hernia repair. GI recommends serial hemoccult monitoring as outpatient and colonoscopy if heme positive.   2. Bilateral leg swelling, left leg more prominent. DVT ruled out on doppler study. With SOB could be related to CHF exacerbation given negative study  3. Progressive Dyspnea on exertion, likely multifactorial. Suspect related to symptomatic anemia,  diastolic CHF exacerbation and COPD flare plus OSA/OHS. Wheezing and moderate respiratory distress could be either cardiac or pulmonary etiology. BNP wnl but usually normal in obese pts. CXR not volume up. Scheduled nebs, IV lasix and prednisone. If no improvement consider CTA to rule out PE, encouraged given no tachycardia, no oxygen requirement and negative doppler study  4. Acute on chronic CHFpEF exacerbation, some improvement but still pretty volume overloaded. Weight gain over 15+lbs per patient, worsening dyspnea, significant peripheral edema consistent with exacerbation. BNP non-diagnostic which is common with obesity. Repeat TTE shows preserved EF. 1 L out in 24 hours. IV lasix 40 mg BID, daily weights, strict I/os  5. Obesity Hypoventilation Syndrome, improving with less wheezing on exam and no oxygen requirements. No PFTs on file for COPD. Scheduled prednisone and duo-nebs should continue to improve  6. OSA- CPAP nightly  7. Type 2 diabetes, controlled.  Holding home metformin.  Monitor CBG, sliding scale insulin  8. GERD. Continue PPI since symptoms well controlled   Code Status: FULL   Family Communication: Family at bedside   Disposition Plan: Improvement in breathing, stable hgb, IV diuresis for now   Consultants:  GI   Procedures:  EGD- 6/1 : large hiatal hernia, no Cameron erosions  Antimicrobials:  None   Cultures:  None  Telemetry:yes  DVT prophylaxis:  SCDs   Objective: Vitals:   May 15, 2018 0950 May 15, 2018 0955 2018-05-15 1210 2018/05/15 1317  BP: (!) 139/47   (!) 151/72  Pulse: 72    77  Resp: (!) 22   (!) 24  Temp:   98.4 F (36.9 C) 98.3 F (36.8 C)  TempSrc:   Oral   SpO2: 100% 99%  99%  Weight:      Height:        Intake/Output Summary (Last 24 hours) at 2018-05-15 1406 Last data filed at 15-May-2018 0941 Gross per 24 hour  Intake 640 ml  Output 800 ml  Net -160 ml   Filed Weights   04/30/2018 1138 May 15, 2018 0717  Weight: (!) 181.9 kg (401 lb) (!) 185.4 kg (408 lb 11.7 oz)    Exam:  Constitutional: morbidly obese, otherwise normal appearing female  Cardiovascular: RRR no MRGs, with 1+ pitting edema mainly in right leg( from distal calf to knee) though much more improved than yesterday Respiratory: normal respiratory effort on room air, no accessory muscle use, no wheezing, difficult auscultating any crackles on posterior exam due to body habitus  Skin: No rash ulcers, or lesions. Without skin tenting  Neurologic: Grossly no focal neuro deficit. Psychiatric:Appropriate affect, and mood. Mental status AAOx3  Data Reviewed: CBC: Recent Labs  Lab 05/03/2018 1152 05/09/18 0506 05/09/18 1711 2018-05-15 0756  WBC 7.2 7.9 9.9 7.4  HGB 6.5* 7.5* 8.1* 7.5*  HCT 25.2* 27.0* 29.5* 27.9*  MCV 73.0* 74.0* 73.8* 75.8*  PLT 338 296 308 267   Basic Metabolic Panel: Recent Labs  Lab 05/09/2018 1152 05/09/18 0506  NA 139 137  K 4.1 4.2  CL 106 103  CO2 25 25  GLUCOSE 128* 131*  BUN 8 7  CREATININE 0.78 0.61  CALCIUM 9.0 8.6*   GFR: Estimated Creatinine Clearance: 110.6 mL/min (by C-G formula based on SCr of 0.61 mg/dL). Liver Function Tests: No results for input(s): AST, ALT, ALKPHOS, BILITOT, PROT, ALBUMIN in the last 168 hours. No results for input(s): LIPASE, AMYLASE in the last 168 hours. No results for input(s): AMMONIA in the last 168 hours. Coagulation Profile: No results for input(s): INR, PROTIME in the last 168 hours. Cardiac Enzymes: No results for input(s): CKTOTAL, CKMB, CKMBINDEX, TROPONINI in the last 168 hours. BNP (last 3 results) No  results for input(s): PROBNP in the last 8760 hours. HbA1C: No results for input(s): HGBA1C in the last 72 hours. CBG: Recent Labs  Lab 04/19/2018 2224 05/09/18 0753 05/09/18 1656 May 15, 2018 0749 2018/05/15 1159  GLUCAP 197* 144* 166* 113* 124*   Lipid Profile: No results for input(s): CHOL, HDL, LDLCALC, TRIG, CHOLHDL, LDLDIRECT in the last 72 hours. Thyroid Function Tests: Recent Labs    04/13/2018 1831  TSH 1.809  FREET4 0.86   Anemia Panel: Recent Labs    04/26/2018 1613  VITAMINB12 153*  FOLATE 10.3  FERRITIN 6*  TIBC 420  IRON 10*  RETICCTPCT 2.4   Urine analysis:    Component Value Date/Time   COLORURINE YELLOW 06/24/2017 1125   APPEARANCEUR HAZY (A) 06/24/2017 1125   LABSPEC 1.015 06/24/2017 1125   PHURINE 6.0 06/24/2017 1125   GLUCOSEU NEGATIVE 06/24/2017 1125   HGBUR NEGATIVE 06/24/2017 1125   HGBUR large 08/24/2010 0832   BILIRUBINUR NEGATIVE 06/24/2017 1125   KETONESUR NEGATIVE 06/24/2017 1125   PROTEINUR NEGATIVE 06/24/2017 1125   UROBILINOGEN  0.2 03/28/2015 1151   NITRITE NEGATIVE 06/24/2017 1125   LEUKOCYTESUR MODERATE (A) 06/24/2017 1125   Sepsis Labs: @LABRCNTIP (procalcitonin:4,lacticidven:4)  )No results found for this or any previous visit (from the past 240 hour(s)).    Studies: No results found.  Scheduled Meds: . diclofenac sodium  2 g Topical QID  . ferrous sulfate  325 mg Oral BID WC  . furosemide  40 mg Intravenous Once  . insulin aspart  0-20 Units Subcutaneous TID WC  . ipratropium-albuterol  3 mL Nebulization TID  . predniSONE  40 mg Oral Q breakfast  . protein supplement shake  11 oz Oral BID BM    Continuous Infusions: . famotidine (PEPCID) IV Stopped (06/04/2018 1142)     LOS: 2 days     Laverna PeaceShayla D Dema Timmons, MD [facility] Pager [phone]  If 7PM-7AM, please contact night-coverage [email or web address] Password [email or web address] 05/27/2018, 2:06 PM

## 2018-05-10 DEATH — deceased

## 2018-05-11 ENCOUNTER — Encounter (HOSPITAL_COMMUNITY): Payer: Self-pay | Admitting: Gastroenterology

## 2018-05-11 LAB — GLUCOSE, CAPILLARY
GLUCOSE-CAPILLARY: 100 mg/dL — AB (ref 65–99)
GLUCOSE-CAPILLARY: 197 mg/dL — AB (ref 65–99)
Glucose-Capillary: 116 mg/dL — ABNORMAL HIGH (ref 65–99)
Glucose-Capillary: 171 mg/dL — ABNORMAL HIGH (ref 65–99)

## 2018-05-11 LAB — BASIC METABOLIC PANEL
Anion gap: 9 (ref 5–15)
BUN: 16 mg/dL (ref 6–20)
CO2: 25 mmol/L (ref 22–32)
CREATININE: 0.74 mg/dL (ref 0.44–1.00)
Calcium: 8.9 mg/dL (ref 8.9–10.3)
Chloride: 104 mmol/L (ref 101–111)
GFR calc Af Amer: >60 mL/min (ref >60)
GLUCOSE: 109 mg/dL — AB (ref 65–99)
Potassium: 3.6 mmol/L (ref 3.5–5.1)
Sodium: 138 mmol/L (ref 135–145)

## 2018-05-11 LAB — CBC
HCT: 30.6 % — ABNORMAL LOW (ref 36.0–46.0)
Hemoglobin: 8 g/dL — ABNORMAL LOW (ref 12.0–15.0)
MCH: 20 pg — AB (ref 26.0–34.0)
MCHC: 26.1 g/dL — AB (ref 30.0–36.0)
MCV: 76.5 fL — AB (ref 78.0–100.0)
PLATELETS: 310 10*3/uL (ref 150–400)
RBC: 4 MIL/uL (ref 3.87–5.11)
RDW: 22.2 % — ABNORMAL HIGH (ref 11.5–15.5)
WBC: 9.3 10*3/uL (ref 4.0–10.5)

## 2018-05-11 MED ORDER — FAMOTIDINE 20 MG PO TABS
20.0000 mg | ORAL_TABLET | Freq: Two times a day (BID) | ORAL | Status: DC
  Administered 2018-05-11: 20 mg via ORAL
  Filled 2018-05-11 (×2): qty 1

## 2018-05-11 MED ORDER — FUROSEMIDE 40 MG PO TABS
40.0000 mg | ORAL_TABLET | Freq: Two times a day (BID) | ORAL | Status: DC
  Administered 2018-05-11: 40 mg via ORAL
  Filled 2018-05-11 (×2): qty 1

## 2018-05-11 NOTE — Progress Notes (Signed)
Patient resting in bed. Edema in bilateral lower extremities is slightly better today. Pt continues to have pain with touch to right calf - helped by pain medication and voltaren gel application.

## 2018-05-11 NOTE — Progress Notes (Signed)
PROGRESS NOTE  Brenda CrockerMary W Mitcheltree ZOX:[id number] DOB: 02/05/1951 DOA: 05/07/2018 PCP: Claiborne RiggFleming, Zelda W, NP  Brief Narrative  Brenda Clark is a 67 y.o. year old female with medical history significant for morbid obesity, COPD, OSA, arthritis of the knees, history of PE in 2012, diabetes type 2, history of migraines, history of iron deficiency anemia on chronic iron, with pica  who presented on 04/14/2018 with progressive dyspnea on exertion for several weeks and right lower extremity swelling concerning for a DVT and was found to have low hemoglobin concerning for symptomatic anemia and possible CHF exacerbation and OHS. Her anemia of 6.5 was treated with 2 U PRBC and improved, her FOBT was negative, and no active bleeding during admission. Underwent EGD on 6/1 which showed no active bleeding and no current Cameron lesions. Venous duplex was negative for DVT and she was started on scheduled nebs and IV lasix for her respiratory issues.    Subjective Feels breathing has improved with lasix and nebulizer treatments  Assessment/Plan: Principal Problem:   Symptomatic anemia Active Problems:   Diabetes type 2, controlled (HCC)   Hyperlipidemia   MORBID OBESITY   Migraine headache   Osteoarthritis   INSOMNIA   SOB (shortness of breath)   Sleep apnea with use of continuous positive airway pressure (CPAP)   Obesity hypoventilation syndrome (HCC)   Hiatal hernia   HTN (hypertension)   Edema of both legs   Anemia, iron deficiency   Chronic diastolic heart failure (HCC)   1. Acute on chronic anemia, symptomatic, stable after 2 U PRBC on day of admission. Hgb stable at 8 today. Iron panel consistent with iron deficiency anemia.  EGD  with no active bleed and resolution of previous Sheria LangCameron lesions Likely malabsorption of iron from large hiatal hernia per GI + chronic PPI. GI to f/u duodenal biopsy results ( to rule out Celiac)as outpt. Will likely need IV iron as outpatient  to maintain hemoglobin  since she's not a great surgical candidate for hernia repair. GI recommends serial hemoccult monitoring as outpatient and colonoscopy if heme positive. Daily CBC while here  2. Bilateral leg swelling, left leg more prominent. DVT ruled out on doppler study. With SOB most likely related to CHF exacerbation given negative study and improvement with lasix  3. Progressive Dyspnea on exertion, likely multifactorial. Suspect related to symptomatic anemia,  diastolic CHF exacerbation and  OSA/OHS. She has improved with lasix and scheduled inhalers + prednisone.   4. Acute on chronic CHFpEF exacerbation. - 3 L this admission. Down 1 lb on weights ( unclear accuracy) and improved dyspnea as well as improved respiratory exam/less edema. Will stop IV lasix and go to lasix 40 mg BID for today and monitor output.   5. Obesity Hypoventilation Syndrome, improved. No wheezing, no O2 requirements. No PFTs on file for COPD. Scheduled prednisone and duo-nebs should continue to improve. C/m to assist with neb machine  6. OSA- CPAP nightly  7. Type 2 diabetes, controlled.  Holding home metformin.  Monitor CBG, sliding scale insulin  8. GERD. Continue PPI since symptoms well controlled   Code Status: FULL   Family Communication: Family at bedside   Disposition Plan: monitor output on oral lasix, monitor CBC, needs neb machine for at home   Consultants:  GI   Procedures:  EGD- 6/1 : large hiatal hernia, no Cameron erosions  Antimicrobials:  None   Cultures:  None  Telemetry:yes  DVT prophylaxis:  SCDs   Objective: Vitals:  05/13/2018 2054 May 13, 2018 2204 05/11/18 0636 05/11/18 1305  BP:  (!) 149/53 (!) 139/50 (!) 153/72  Pulse:  99 72 89  Resp:  18 18 18   Temp:  (!) 97.2 F (36.2 C) (!) 97.5 F (36.4 C) 98 F (36.7 C)  TempSrc:  Oral Oral   SpO2: 99% 93% 95% 96%  Weight:   (!) 184.7 kg (407 lb 3 oz)   Height:        Intake/Output Summary (Last 24 hours) at 05/11/2018 1537 Last data  filed at 05/11/2018 1500 Gross per 24 hour  Intake 820 ml  Output 3550 ml  Net -2730 ml   Filed Weights   05/09/2018 1138 05/13/2018 0717 05/11/18 0636  Weight: (!) 181.9 kg (401 lb) (!) 185.4 kg (408 lb 11.7 oz) (!) 184.7 kg (407 lb 3 oz)    Exam:  Constitutional: morbidly obese, otherwise normal appearing female Cardiovascular: RRR no MRGs, with 1+ pitting edema mainly in right leg( from distal calf to knee) continues to be much more improved than yesterday Respiratory: normal respiratory effort on room air, no accessory muscle use, no wheezing, difficult auscultating any crackles on posterior exam due to body habitus  Skin: No rash ulcers, or lesions. Without skin tenting  Neurologic: Grossly no focal neuro deficit. Psychiatric:Appropriate affect, and mood. Mental status AAOx3  Data Reviewed: CBC: Recent Labs  Lab 04/29/2018 1152 05/09/18 0506 05/09/18 1711 May 13, 2018 0756 05/11/18 0745  WBC 7.2 7.9 9.9 7.4 9.3  HGB 6.5* 7.5* 8.1* 7.5* 8.0*  HCT 25.2* 27.0* 29.5* 27.9* 30.6*  MCV 73.0* 74.0* 73.8* 75.8* 76.5*  PLT 338 296 308 267 310   Basic Metabolic Panel: Recent Labs  Lab 04/21/2018 1152 05/09/18 0506 05/11/18 0745  NA 139 137 138  K 4.1 4.2 3.6  CL 106 103 104  CO2 25 25 25   GLUCOSE 128* 131* 109*  BUN 8 7 16   CREATININE 0.78 0.61 0.74  CALCIUM 9.0 8.6* 8.9   GFR: Estimated Creatinine Clearance: 110.3 mL/min (by C-G formula based on SCr of 0.74 mg/dL). Liver Function Tests: No results for input(s): AST, ALT, ALKPHOS, BILITOT, PROT, ALBUMIN in the last 168 hours. No results for input(s): LIPASE, AMYLASE in the last 168 hours. No results for input(s): AMMONIA in the last 168 hours. Coagulation Profile: No results for input(s): INR, PROTIME in the last 168 hours. Cardiac Enzymes: No results for input(s): CKTOTAL, CKMB, CKMBINDEX, TROPONINI in the last 168 hours. BNP (last 3 results) No results for input(s): PROBNP in the last 8760 hours. HbA1C: No results for  input(s): HGBA1C in the last 72 hours. CBG: Recent Labs  Lab May 13, 2018 1159 2018-05-13 1723 05/13/2018 2202 05/11/18 0804 05/11/18 1155  GLUCAP 124* 185* 202* 100* 116*   Lipid Profile: No results for input(s): CHOL, HDL, LDLCALC, TRIG, CHOLHDL, LDLDIRECT in the last 72 hours. Thyroid Function Tests: Recent Labs    04/25/2018 1831  TSH 1.809  FREET4 0.86   Anemia Panel: Recent Labs    04/10/2018 1613  VITAMINB12 153*  FOLATE 10.3  FERRITIN 6*  TIBC 420  IRON 10*  RETICCTPCT 2.4   Urine analysis:    Component Value Date/Time   COLORURINE YELLOW 06/24/2017 1125   APPEARANCEUR HAZY (A) 06/24/2017 1125   LABSPEC 1.015 06/24/2017 1125   PHURINE 6.0 06/24/2017 1125   GLUCOSEU NEGATIVE 06/24/2017 1125   HGBUR NEGATIVE 06/24/2017 1125   HGBUR large 08/24/2010 0832   BILIRUBINUR NEGATIVE 06/24/2017 1125   KETONESUR NEGATIVE 06/24/2017 1125   PROTEINUR  NEGATIVE 06/24/2017 1125   UROBILINOGEN 0.2 03/28/2015 1151   NITRITE NEGATIVE 06/24/2017 1125   LEUKOCYTESUR MODERATE (A) 06/24/2017 1125   Sepsis Labs: @LABRCNTIP (procalcitonin:4,lacticidven:4)  )No results found for this or any previous visit (from the past 240 hour(s)).    Studies: No results found.  Scheduled Meds: . diclofenac sodium  2 g Topical QID  . famotidine  20 mg Oral BID  . ferrous sulfate  325 mg Oral BID WC  . furosemide  40 mg Oral BID  . insulin aspart  0-20 Units Subcutaneous TID WC  . ipratropium-albuterol  3 mL Nebulization TID  . predniSONE  40 mg Oral Q breakfast  . protein supplement shake  11 oz Oral BID BM    Continuous Infusions:    LOS: 3 days     Laverna Peace, MD [facility] Pager [phone]  If 7PM-7AM, please contact night-coverage [email or web address] Password [email or web address] 05/11/2018, 3:37 PM

## 2018-05-11 NOTE — Anesthesia Postprocedure Evaluation (Signed)
Anesthesia Post Note  Patient: Brenda Clark  Procedure(s) Performed: ESOPHAGOGASTRODUODENOSCOPY (EGD) WITH PROPOFOL (N/A ) BIOPSY     Patient location during evaluation: Endoscopy Anesthesia Type: MAC Level of consciousness: awake and alert Pain management: pain level controlled Vital Signs Assessment: post-procedure vital signs reviewed and stable Respiratory status: spontaneous breathing, nonlabored ventilation, respiratory function stable and patient connected to nasal cannula oxygen Cardiovascular status: stable and blood pressure returned to baseline Postop Assessment: no apparent nausea or vomiting Anesthetic complications: no    Last Vitals:  Vitals:   05/22/2018 2054 05/22/2018 2204  BP:  (!) 149/53  Pulse:  99  Resp:  18  Temp:  (!) 36.2 C  SpO2: 99% 93%    Last Pain:  Vitals:   05/21/2018 2206  TempSrc:   PainSc: 3                  Havoc Sanluis COKER

## 2018-05-12 ENCOUNTER — Inpatient Hospital Stay (HOSPITAL_COMMUNITY): Payer: Medicare Other | Admitting: Anesthesiology

## 2018-05-12 LAB — CBC
HEMATOCRIT: 30.9 % — AB (ref 36.0–46.0)
HEMOGLOBIN: 8.4 g/dL — AB (ref 12.0–15.0)
MCH: 20.7 pg — ABNORMAL LOW (ref 26.0–34.0)
MCHC: 27.2 g/dL — AB (ref 30.0–36.0)
MCV: 76.1 fL — ABNORMAL LOW (ref 78.0–100.0)
Platelets: 357 10*3/uL (ref 150–400)
RBC: 4.06 MIL/uL (ref 3.87–5.11)
RDW: 22.6 % — ABNORMAL HIGH (ref 11.5–15.5)
WBC: 9.5 10*3/uL (ref 4.0–10.5)

## 2018-05-12 LAB — BASIC METABOLIC PANEL
ANION GAP: 11 (ref 5–15)
BUN: 19 mg/dL (ref 6–20)
CHLORIDE: 102 mmol/L (ref 101–111)
CO2: 28 mmol/L (ref 22–32)
Calcium: 9.3 mg/dL (ref 8.9–10.3)
Creatinine, Ser: 0.71 mg/dL (ref 0.44–1.00)
GFR calc non Af Amer: >60 mL/min (ref >60)
Glucose, Bld: 112 mg/dL — ABNORMAL HIGH (ref 65–99)
POTASSIUM: 3.8 mmol/L (ref 3.5–5.1)
SODIUM: 141 mmol/L (ref 135–145)

## 2018-05-12 LAB — GLUCOSE, CAPILLARY
GLUCOSE-CAPILLARY: 107 mg/dL — AB (ref 65–99)
GLUCOSE-CAPILLARY: 134 mg/dL — AB (ref 65–99)

## 2018-05-12 MED ORDER — IPRATROPIUM-ALBUTEROL 0.5-2.5 (3) MG/3ML IN SOLN
3.0000 mL | Freq: Two times a day (BID) | RESPIRATORY_TRACT | Status: DC
  Administered 2018-05-12: 3 mL via RESPIRATORY_TRACT
  Filled 2018-05-12 (×2): qty 3

## 2018-05-12 MED FILL — Medication: Qty: 1 | Status: AC

## 2018-05-23 ENCOUNTER — Ambulatory Visit: Payer: Self-pay | Admitting: Nurse Practitioner

## 2018-06-09 NOTE — Progress Notes (Addendum)
Patient assisted out of the bed to chair with 2 person assist. Patient stood and walked to the chair with walker. Patient was complaining of SOB and requested a breathing treatment. NT reported to RN and RN pulled breathing treatment and morning medication. RT in room to breathing treatment while RN pulling morning medications and coaching patient to breath and relax Patient sats ws 93% on 2L. Once RN enter the room and notice change in color tone, I  checked for a pulse then called a code and started compression.

## 2018-06-09 NOTE — Anesthesia Procedure Notes (Signed)
Procedure Name: Intubation Date/Time: 2018/06/03 9:15 AM Performed by: Kyung Rudd, CRNA Pre-anesthesia Checklist: Patient identified, Emergency Drugs available, Suction available and Patient being monitored Patient Re-evaluated:Patient Re-evaluated prior to induction Oxygen Delivery Method: Ambu bag Ventilation: Mask ventilation without difficulty Laryngoscope Size: Mac and 3 Grade View: Grade I Tube type: Oral Tube size: 7.5 mm Number of attempts: 1 Airway Equipment and Method: Stylet and Oral airway Placement Confirmation: ETT inserted through vocal cords under direct vision,  positive ETCO2,  breath sounds checked- equal and bilateral and CO2 detector Secured at: 20 cm Tube secured with: Tape Dental Injury: Teeth and Oropharynx as per pre-operative assessment

## 2018-06-09 NOTE — Anesthesia Preprocedure Evaluation (Addendum)
Anesthesia Evaluation  Patient identified by MRN, date of birth, ID band Patient unresponsive  Preop documentation limited or incomplete due to emergent nature of procedure.  Airway        Dental   Pulmonary           Cardiovascular hypertension,      Neuro/Psych    GI/Hepatic   Endo/Other  diabetes  Renal/GU      Musculoskeletal   Abdominal   Peds  Hematology   Anesthesia Other Findings   Reproductive/Obstetrics                            Anesthesia Physical Anesthesia Plan  ASA:   Anesthesia Plan: General   Post-op Pain Management:    Induction:   PONV Risk Score and Plan:   Airway Management Planned: Oral ETT  Additional Equipment:   Intra-op Plan:   Post-operative Plan:   Informed Consent:   Only emergency history available  Plan Discussed with:   Anesthesia Plan Comments:         Anesthesia Quick Evaluation

## 2018-06-09 NOTE — Code Documentation (Signed)
  Patient Name: Brenda Clark   MRN: [id number]   Date of Birth/ Sex: 05-Jul-1951 , female      Admission Date: 05/05/2018  Attending Provider: Laverna PeaceNettey, Shayla D, MD  Primary Diagnosis: Symptomatic anemia   Indication: Pt was in her usual state of health until this AM, when she was noted to be unconscious and apneic. Code blue was subsequently called. At the time of arrival on scene, ACLS protocol was underway.   Technical Description:  - CPR performance duration:  36 minutes  - Was defibrillation or cardioversion used? No   - Was external pacer placed? No  - Was patient intubated pre/post CPR? Yes   Medications Administered: Y = Yes; Blank = No Amiodarone    Atropine    Calcium    Epinephrine  Y  Lidocaine    Magnesium  Y  Norepinephrine    Phenylephrine    Sodium bicarbonate  Y  Vasopressin     Post CPR evaluation:  - Final Status - Was patient successfully resuscitated ? No - What is current rhythm? PEA - What is current hemodynamic status? deceased  Miscellaneous Information:  - Labs sent, including: none  - Primary team notified?  Yes  - Family Notified? Yes - unable to reach son and daughter, but messages left  - Additional notes/ transfer status: Patient was in PEA or asystole throughout resuscitation efforts, was pronounced deceased at 0936     Lennox SoldersWinfrey, Amanda C, MD  05/14/2018, 9:42 AM

## 2018-06-09 NOTE — Death Summary Note (Signed)
DEATH SUMMARY   Patient Details  Name: Brenda Clark MRN: [id number] DOB: 1951/11/28  Admission/Discharge Information   Admit Date:  04/16/2018  Date of Death: Date of Death: Jan 06, 2018  Time of Death: Time of Death: 0936  Length of Stay: 4  Referring Physician: Claiborne RiggFleming, Zelda W, NP   Reason(s) for Hospitalization  symptomatic anemia  Diagnoses  Preliminary cause of death:  Secondary Diagnoses (including complications and co-morbidities):  Principal Problem:   Symptomatic anemia Active Problems:   Diabetes type 2, controlled (HCC)   Hyperlipidemia   MORBID OBESITY   Migraine headache   Osteoarthritis   INSOMNIA   SOB (shortness of breath)   Sleep apnea with use of continuous positive airway pressure (CPAP)   Obesity hypoventilation syndrome (HCC)   Hiatal hernia   HTN (hypertension)   Edema of both legs   Anemia, iron deficiency   Chronic diastolic heart failure [facility](HCC)   Brief Hospital Course (including significant findings, care, treatment, and services provided and events leading to death)  Brenda CrockerMary W Clark is a 67 y.o. year old female with medical history significant for morbid obesity, COPD, OSA, arthritis of the knees, history of PE in 2012, diabetes type 2, history of migraines, history of iron deficiency anemia on chronic iron, with pica  who presented on 04/10/2018 with progressive dyspnea on exertion for several weeks and right lower extremity swelling concerning for a DVT and was found to have low hemoglobin concerning for symptomatic anemia and possible CHF exacerbation and OHS. Her anemia of 6.5 was treated with 2 U PRBC and improved, her FOBT was negative, and no active bleeding during admission. Underwent EGD on 6/1 which showed no active bleeding and no current Cameron lesions. Venous duplex was negative for DVT and she was started on scheduled nebs and IV lasix for her respiratory issues which improved and remained stable after transition to oral lasix.    Unfortunately, after being assisted to bedside chair patient complained of SOB and was put on 2 L [place] with 93% SpO2 as reported by nurses. Prior to being able to start hr breathing treatment she was found pulseless and breathless. Code was started but patient did not get resuscitated.     Pertinent Labs and Studies  Significant Diagnostic Studies Dg Chest 2 View  Result Date: 04/16/2018 CLINICAL DATA:  Shortness of breath. Swelling of the right leg. Duration of symptoms 3 weeks. EXAM: CHEST - 2 VIEW COMPARISON:  06/24/2017 FINDINGS: Cardiomegaly. Aortic atherosclerosis. Moderate size hiatal hernia. The pulmonary vascularity is normal. Lungs are clear. Chronic degenerative changes affect the spine. Bridging osteophytes suggesting DISH. IMPRESSION: No acute radiographic finding. Cardiomegaly. Aortic atherosclerosis. Hiatal hernia. Electronically Signed   By: Paulina FusiMark  Shogry M.D.   On: 04/18/2018 13:57   Dg Bone Density  Result Date: 04/24/2018 EXAM: DUAL X-RAY ABSORPTIOMETRY (DXA) FOR BONE MINERAL DENSITY IMPRESSION: Referring Physician:  Claiborne RiggZELDA W FLEMING PATIENT: Name: Brenda CrockerMinter, Kazandra W Patient ID:  [id number] Birth Date: [birth year] Height:     63.5 in. Weight:     408.8 lbs. Sex:         Female Measured:   04/24/2018 Indications: Estrogen Deficient, Height Loss (781.91), Postmenopausal, Prilosec Fractures: None Treatments: Vitamin D (E933.5) ASSESSMENT: The BMD measured at Forearm Radius 33% is 0.958 g/cm2 with a T-score of 0.8. This patient is considered NORMAL according to World Health Organization [facility](WHO) criteria. Lumbar spine was not utilized due to advanced degenerative changes. The scan quality is limited by body habitus. Site Region Measured Date  Measured Age YA T-score BMD Significant CHANGE Left Forearm Radius 33% 04/24/2018 69.2 0.6 0.958 g/cm2 DualFemur Total Left 04/24/2018 69.2 1.0 1.139 g/cm2 DualFemur Total Mean 04/24/2018 69.2 1.4 1.183 g/cm2 World Health Organization [facility]) criteria for  post-menopausal, Caucasian Women: Normal       T-score at or above -1 SD Osteopenia   T-score between -1 and -2.5 SD Osteoporosis T-score at or below -2.5 SD RECOMMENDATION: 1. All patients should optimize calcium and vitamin D intake. 2. Consider FDA approved medical therapies in postmenopausal women and men aged 69 years and older, based on the following: a. A hip or vertebral (clinical or morphometric) fracture b. T- score < or = -2.5 at the femoral neck or spine after appropriate evaluation to exclude secondary causes c. Low bone mass (T-score between -1.0 and -2.5 at the femoral neck or spine) and a 10 year probability of a hip fracture > or = 3% or a 10 year probability of a major osteoporosis-related fracture > or = 20% based on the [place]-adapted WHO algorithm d. Clinician judgment and/or patient preferences may indicate treatment for people with 10-year fracture probabilities above or below these levels FOLLOW-UP: People with diagnosed cases of osteoporosis or at high risk for fracture should have regular bone mineral density tests. For patients eligible for Medicare, routine testing is allowed once every 2 years. The testing frequency can be increased to one year for patients who have rapidly progressing disease, those who are receiving or discontinuing medical therapy to restore bone mass, or have additional risk factors. I have reviewed this report and agree with the above findings. Mark A. Tyron Russell, M.D. [facility] Radiology Electronically Signed   By: Ulyses Southward M.D.   On: 04/24/2018 10:39   Mm Screening Breast Tomo Bilateral  Result Date: 04/24/2018 CLINICAL DATA:  Screening. EXAM: DIGITAL SCREENING BILATERAL MAMMOGRAM WITH TOMO AND CAD COMPARISON:  Previous exam(s). ACR Breast Density Category a: The breast tissue is almost entirely fatty. FINDINGS: There are no findings suspicious for malignancy. Images were processed with CAD. IMPRESSION: No mammographic evidence of malignancy. A result letter of  this screening mammogram will be mailed directly to the patient. RECOMMENDATION: Screening mammogram in one year. (Code:[id number]-B-01Y) BI-RADS CATEGORY  1: Negative. Electronically Signed   By: Amie Portland M.D.   On: 04/24/2018 11:33    Microbiology No results found for this or any previous visit (from the past 240 hour(s)).  Lab Basic Metabolic Panel: Recent Labs  Lab 04/19/2018 1152 05/09/18 0506 05/11/18 0745 14-May-2018 0638  NA 139 137 138 141  K 4.1 4.2 3.6 3.8  CL 106 103 104 102  CO2 25 25 25 28   GLUCOSE 128* 131* 109* 112*  BUN 8 7 16 19   CREATININE 0.78 0.61 0.74 0.71  CALCIUM 9.0 8.6* 8.9 9.3   Liver Function Tests: No results for input(s): AST, ALT, ALKPHOS, BILITOT, PROT, ALBUMIN in the last 168 hours. No results for input(s): LIPASE, AMYLASE in the last 168 hours. No results for input(s): AMMONIA in the last 168 hours. CBC: Recent Labs  Lab 05/09/18 0506 05/09/18 1711 05/20/2018 0756 05/11/18 0745 05/14/18 0638  WBC 7.9 9.9 7.4 9.3 9.5  HGB 7.5* 8.1* 7.5* 8.0* 8.4*  HCT 27.0* 29.5* 27.9* 30.6* 30.9*  MCV 74.0* 73.8* 75.8* 76.5* 76.1*  PLT 296 308 267 310 357   Cardiac Enzymes: No results for input(s): CKTOTAL, CKMB, CKMBINDEX, TROPONINI in the last 168 hours. Sepsis Labs: Recent Labs  Lab 05/09/18 1711 05/20/2018 0756 05/11/18 0745 05-14-18  8295  WBC 9.9 7.4 9.3 9.5    Procedures/Operations   EGD- 6/1 : large hiatal hernia, no Sheria Lang erosions      Laverna Peace 06/01/2018, 5:27 PM

## 2018-06-09 NOTE — Progress Notes (Signed)
   Jun 10, 2018 1100  Clinical Encounter Type  Visited With Patient;Family;Health care provider  Visit Type Code;Death  Referral From Nurse  Consult/Referral To Chaplain  Spiritual Encounters  Spiritual Needs Emotional;Grief support;Prayer  Stress Factors  Family Stress Factors Loss   Responded to a Code Blue just before 9 AM. Rapid response was with the patient.  No family present.  Called Daughter and Son leaving messages.  Medical staff asked me to call again, I called both again.  Daughter called back and we asked her to come right away.  Daughter arrived and grandson.  We met with them in the conference room to let them know the patient had passed.  Brother arrived and other family.  Supported the family, along with staff, at bedside.  Will continue to follow and support the family and the staff.  Chaplain Katherene Ponto

## 2018-06-09 DEATH — deceased

## 2018-07-10 ENCOUNTER — Ambulatory Visit: Payer: 59 | Admitting: Podiatry
